# Patient Record
Sex: Female | Born: 1935 | Race: Black or African American | Hispanic: No | Marital: Single | Smoking: Former smoker
Health system: Southern US, Community
[De-identification: ages and names within clinical notes are randomized; demographics above are authoritative.]

## PROBLEM LIST (undated history)

## (undated) DIAGNOSIS — I82409 Acute embolism and thrombosis of unspecified deep veins of unspecified lower extremity: Secondary | ICD-10-CM

## (undated) DIAGNOSIS — R0600 Dyspnea, unspecified: Secondary | ICD-10-CM

## (undated) DIAGNOSIS — I6523 Occlusion and stenosis of bilateral carotid arteries: Secondary | ICD-10-CM

## (undated) DIAGNOSIS — E119 Type 2 diabetes mellitus without complications: Secondary | ICD-10-CM

## (undated) DIAGNOSIS — I10 Essential (primary) hypertension: Secondary | ICD-10-CM

## (undated) DIAGNOSIS — E785 Hyperlipidemia, unspecified: Secondary | ICD-10-CM

## (undated) DIAGNOSIS — M1990 Unspecified osteoarthritis, unspecified site: Secondary | ICD-10-CM

## (undated) DIAGNOSIS — I890 Lymphedema, not elsewhere classified: Secondary | ICD-10-CM

## (undated) DIAGNOSIS — R0609 Other forms of dyspnea: Secondary | ICD-10-CM

## (undated) HISTORY — PX: ABDOMINAL HYSTERECTOMY: SHX81

## (undated) HISTORY — DX: Occlusion and stenosis of bilateral carotid arteries: I65.23

## (undated) HISTORY — PX: CHOLECYSTECTOMY: SHX55

## (undated) HISTORY — DX: Dyspnea, unspecified: R06.00

## (undated) HISTORY — DX: Other forms of dyspnea: R06.09

## (undated) HISTORY — DX: Hyperlipidemia, unspecified: E78.5

---

## 2000-09-03 ENCOUNTER — Ambulatory Visit (HOSPITAL_COMMUNITY): Admission: RE | Admit: 2000-09-03 | Discharge: 2000-09-03 | Payer: Self-pay | Admitting: Family Medicine

## 2001-01-20 ENCOUNTER — Ambulatory Visit (HOSPITAL_COMMUNITY): Admission: RE | Admit: 2001-01-20 | Discharge: 2001-01-20 | Payer: Self-pay | Admitting: *Deleted

## 2001-01-20 ENCOUNTER — Encounter: Payer: Self-pay | Admitting: *Deleted

## 2004-06-19 ENCOUNTER — Ambulatory Visit: Payer: Self-pay | Admitting: Family Medicine

## 2004-12-10 ENCOUNTER — Ambulatory Visit: Payer: Self-pay | Admitting: Family Medicine

## 2004-12-11 ENCOUNTER — Ambulatory Visit: Admission: RE | Admit: 2004-12-11 | Discharge: 2004-12-11 | Payer: Self-pay | Admitting: Family Medicine

## 2004-12-19 ENCOUNTER — Ambulatory Visit (HOSPITAL_COMMUNITY): Admission: RE | Admit: 2004-12-19 | Discharge: 2004-12-19 | Payer: Self-pay | Admitting: Cardiovascular Disease

## 2004-12-24 ENCOUNTER — Ambulatory Visit: Payer: Self-pay | Admitting: Family Medicine

## 2005-02-18 ENCOUNTER — Ambulatory Visit: Payer: Self-pay | Admitting: Family Medicine

## 2005-02-28 ENCOUNTER — Ambulatory Visit: Payer: Self-pay | Admitting: Family Medicine

## 2005-03-07 ENCOUNTER — Ambulatory Visit: Payer: Self-pay | Admitting: Family Medicine

## 2005-03-19 ENCOUNTER — Ambulatory Visit: Payer: Self-pay | Admitting: Internal Medicine

## 2005-09-30 ENCOUNTER — Ambulatory Visit: Payer: Self-pay | Admitting: Family Medicine

## 2005-10-21 ENCOUNTER — Ambulatory Visit: Payer: Self-pay | Admitting: Family Medicine

## 2005-11-07 ENCOUNTER — Inpatient Hospital Stay (HOSPITAL_COMMUNITY): Admission: EM | Admit: 2005-11-07 | Discharge: 2005-11-09 | Payer: Self-pay | Admitting: Emergency Medicine

## 2005-11-07 ENCOUNTER — Ambulatory Visit (HOSPITAL_COMMUNITY): Admission: RE | Admit: 2005-11-07 | Discharge: 2005-11-07 | Payer: Self-pay | Admitting: Family Medicine

## 2005-11-11 ENCOUNTER — Ambulatory Visit: Payer: Self-pay | Admitting: Family Medicine

## 2005-11-15 ENCOUNTER — Ambulatory Visit: Payer: Self-pay | Admitting: Family Medicine

## 2005-11-18 ENCOUNTER — Ambulatory Visit: Payer: Self-pay | Admitting: Family Medicine

## 2005-11-29 ENCOUNTER — Ambulatory Visit: Payer: Self-pay | Admitting: Family Medicine

## 2005-12-09 ENCOUNTER — Ambulatory Visit: Payer: Self-pay | Admitting: Family Medicine

## 2005-12-26 ENCOUNTER — Ambulatory Visit: Payer: Self-pay | Admitting: Family Medicine

## 2006-02-06 ENCOUNTER — Ambulatory Visit: Payer: Self-pay | Admitting: Family Medicine

## 2006-03-10 ENCOUNTER — Ambulatory Visit: Payer: Self-pay | Admitting: Family Medicine

## 2006-03-18 ENCOUNTER — Ambulatory Visit: Payer: Self-pay | Admitting: Family Medicine

## 2006-05-30 ENCOUNTER — Ambulatory Visit: Payer: Self-pay | Admitting: Family Medicine

## 2006-06-04 ENCOUNTER — Ambulatory Visit: Payer: Self-pay | Admitting: Family Medicine

## 2006-06-06 ENCOUNTER — Ambulatory Visit: Payer: Self-pay | Admitting: Internal Medicine

## 2006-06-12 ENCOUNTER — Ambulatory Visit (HOSPITAL_COMMUNITY): Admission: RE | Admit: 2006-06-12 | Discharge: 2006-06-12 | Payer: Self-pay | Admitting: Family Medicine

## 2006-07-25 DIAGNOSIS — K922 Gastrointestinal hemorrhage, unspecified: Secondary | ICD-10-CM | POA: Insufficient documentation

## 2006-08-11 ENCOUNTER — Ambulatory Visit: Payer: Self-pay | Admitting: Family Medicine

## 2006-08-15 ENCOUNTER — Ambulatory Visit: Payer: Self-pay | Admitting: Family Medicine

## 2006-08-25 ENCOUNTER — Ambulatory Visit: Payer: Self-pay | Admitting: Family Medicine

## 2006-09-30 ENCOUNTER — Ambulatory Visit: Payer: Self-pay | Admitting: Family Medicine

## 2006-10-14 ENCOUNTER — Ambulatory Visit: Payer: Self-pay | Admitting: Family Medicine

## 2006-10-23 ENCOUNTER — Ambulatory Visit (HOSPITAL_COMMUNITY): Admission: RE | Admit: 2006-10-23 | Discharge: 2006-10-23 | Payer: Self-pay | Admitting: Gastroenterology

## 2006-10-24 ENCOUNTER — Encounter: Payer: Self-pay | Admitting: Physician Assistant

## 2006-10-24 ENCOUNTER — Encounter (INDEPENDENT_AMBULATORY_CARE_PROVIDER_SITE_OTHER): Payer: Self-pay | Admitting: *Deleted

## 2006-10-24 ENCOUNTER — Ambulatory Visit (HOSPITAL_COMMUNITY): Admission: RE | Admit: 2006-10-24 | Discharge: 2006-10-24 | Payer: Self-pay | Admitting: Gastroenterology

## 2006-11-11 ENCOUNTER — Ambulatory Visit: Payer: Self-pay | Admitting: Family Medicine

## 2006-12-25 ENCOUNTER — Ambulatory Visit: Payer: Self-pay | Admitting: Family Medicine

## 2007-04-07 ENCOUNTER — Ambulatory Visit: Payer: Self-pay | Admitting: Family Medicine

## 2007-04-07 ENCOUNTER — Encounter (INDEPENDENT_AMBULATORY_CARE_PROVIDER_SITE_OTHER): Payer: Self-pay | Admitting: Internal Medicine

## 2007-04-07 LAB — CONVERTED CEMR LAB
INR: 1.5 (ref 0.0–1.5)
Prothrombin Time: 18.7 s — ABNORMAL HIGH (ref 11.6–15.2)

## 2007-04-10 DIAGNOSIS — Z794 Long term (current) use of insulin: Secondary | ICD-10-CM

## 2007-04-10 DIAGNOSIS — Z8719 Personal history of other diseases of the digestive system: Secondary | ICD-10-CM | POA: Insufficient documentation

## 2007-04-10 DIAGNOSIS — Z86718 Personal history of other venous thrombosis and embolism: Secondary | ICD-10-CM

## 2007-04-10 DIAGNOSIS — Z9889 Other specified postprocedural states: Secondary | ICD-10-CM | POA: Insufficient documentation

## 2007-04-10 DIAGNOSIS — E119 Type 2 diabetes mellitus without complications: Secondary | ICD-10-CM | POA: Insufficient documentation

## 2007-04-10 DIAGNOSIS — I10 Essential (primary) hypertension: Secondary | ICD-10-CM | POA: Insufficient documentation

## 2007-04-10 DIAGNOSIS — E11319 Type 2 diabetes mellitus with unspecified diabetic retinopathy without macular edema: Secondary | ICD-10-CM

## 2007-04-10 DIAGNOSIS — E11649 Type 2 diabetes mellitus with hypoglycemia without coma: Secondary | ICD-10-CM | POA: Insufficient documentation

## 2007-04-10 DIAGNOSIS — E785 Hyperlipidemia, unspecified: Secondary | ICD-10-CM

## 2007-04-10 DIAGNOSIS — R609 Edema, unspecified: Secondary | ICD-10-CM | POA: Insufficient documentation

## 2007-04-10 DIAGNOSIS — E113299 Type 2 diabetes mellitus with mild nonproliferative diabetic retinopathy without macular edema, unspecified eye: Secondary | ICD-10-CM | POA: Insufficient documentation

## 2007-05-11 ENCOUNTER — Telehealth (INDEPENDENT_AMBULATORY_CARE_PROVIDER_SITE_OTHER): Payer: Self-pay | Admitting: Family Medicine

## 2007-05-12 ENCOUNTER — Ambulatory Visit: Payer: Self-pay | Admitting: Family Medicine

## 2007-05-15 LAB — CONVERTED CEMR LAB: INR: 2.4 — ABNORMAL HIGH (ref 0.0–1.5)

## 2007-06-09 ENCOUNTER — Ambulatory Visit: Payer: Self-pay | Admitting: Family Medicine

## 2007-06-10 LAB — CONVERTED CEMR LAB: Prothrombin Time: 26.6 s — ABNORMAL HIGH (ref 11.6–15.2)

## 2007-07-07 ENCOUNTER — Ambulatory Visit: Payer: Self-pay | Admitting: Family Medicine

## 2007-07-07 ENCOUNTER — Telehealth (INDEPENDENT_AMBULATORY_CARE_PROVIDER_SITE_OTHER): Payer: Self-pay | Admitting: Family Medicine

## 2007-07-14 ENCOUNTER — Telehealth (INDEPENDENT_AMBULATORY_CARE_PROVIDER_SITE_OTHER): Payer: Self-pay | Admitting: Family Medicine

## 2007-07-15 LAB — CONVERTED CEMR LAB: INR: 2.6 — ABNORMAL HIGH (ref 0.0–1.5)

## 2007-08-10 ENCOUNTER — Ambulatory Visit: Payer: Self-pay | Admitting: Family Medicine

## 2007-08-11 LAB — CONVERTED CEMR LAB
INR: 2.8 — ABNORMAL HIGH (ref 0.0–1.5)
Prothrombin Time: 30.4 s — ABNORMAL HIGH (ref 11.6–15.2)

## 2007-09-09 ENCOUNTER — Ambulatory Visit: Payer: Self-pay | Admitting: Family Medicine

## 2007-09-09 DIAGNOSIS — I872 Venous insufficiency (chronic) (peripheral): Secondary | ICD-10-CM | POA: Insufficient documentation

## 2007-09-09 LAB — CONVERTED CEMR LAB: Hgb A1c MFr Bld: 7.6 %

## 2007-09-14 LAB — CONVERTED CEMR LAB
ALT: 15 U/L
AST: 14 U/L
Albumin: 4.2 g/dL
Alkaline Phosphatase: 78 U/L
BUN: 20 mg/dL
Basophils Absolute: 0 K/uL
Basophils Relative: 0 %
CO2: 23 meq/L
Calcium: 9.3 mg/dL
Chloride: 104 meq/L
Cholesterol: 152 mg/dL
Creatinine, Ser: 0.99 mg/dL
Eosinophils Absolute: 0.1 K/uL
Eosinophils Relative: 1 %
Glucose, Bld: 69 mg/dL — ABNORMAL LOW
HCT: 39 %
HDL: 62 mg/dL
Hemoglobin: 12.2 g/dL
INR: 2.5 — ABNORMAL HIGH
LDL Cholesterol: 68 mg/dL
Lymphocytes Relative: 18 %
Lymphs Abs: 1.3 K/uL
MCHC: 31.3 g/dL
MCV: 90.9 fL
Monocytes Absolute: 0.4 K/uL
Monocytes Relative: 5 %
Neutro Abs: 5.3 K/uL
Neutrophils Relative %: 75 %
Platelets: 285 K/uL
Potassium: 4.8 meq/L
Prothrombin Time: 28.1 s — ABNORMAL HIGH
RBC: 4.29 M/uL
RDW: 14.4 %
Sodium: 142 meq/L
TSH: 0.603 u[IU]/mL
Total Bilirubin: 0.4 mg/dL
Total CHOL/HDL Ratio: 2.5
Total Protein: 7.3 g/dL
Triglycerides: 112 mg/dL
VLDL: 22 mg/dL
WBC: 7 10*3/microliter

## 2007-10-13 ENCOUNTER — Ambulatory Visit: Payer: Self-pay | Admitting: Family Medicine

## 2007-10-15 LAB — CONVERTED CEMR LAB
INR: 2.4 — ABNORMAL HIGH (ref 0.0–1.5)
Prothrombin Time: 27.1 s — ABNORMAL HIGH (ref 11.6–15.2)

## 2007-11-10 ENCOUNTER — Encounter (INDEPENDENT_AMBULATORY_CARE_PROVIDER_SITE_OTHER): Payer: Self-pay | Admitting: Family Medicine

## 2007-11-10 ENCOUNTER — Ambulatory Visit: Payer: Self-pay | Admitting: Internal Medicine

## 2007-11-12 LAB — CONVERTED CEMR LAB
INR: 2 — ABNORMAL HIGH (ref 0.0–1.5)
Prothrombin Time: 23.5 s — ABNORMAL HIGH (ref 11.6–15.2)

## 2007-12-14 ENCOUNTER — Ambulatory Visit: Payer: Self-pay | Admitting: Family Medicine

## 2007-12-17 LAB — CONVERTED CEMR LAB: INR: 2.4 — ABNORMAL HIGH (ref 0.0–1.5)

## 2008-01-14 ENCOUNTER — Encounter (INDEPENDENT_AMBULATORY_CARE_PROVIDER_SITE_OTHER): Payer: Self-pay | Admitting: Nurse Practitioner

## 2008-01-14 ENCOUNTER — Ambulatory Visit: Payer: Self-pay | Admitting: Family Medicine

## 2008-01-15 LAB — CONVERTED CEMR LAB: Prothrombin Time: 29.7 s — ABNORMAL HIGH (ref 11.6–15.2)

## 2008-02-15 ENCOUNTER — Ambulatory Visit: Payer: Self-pay | Admitting: Family Medicine

## 2008-03-16 ENCOUNTER — Ambulatory Visit: Payer: Self-pay | Admitting: Family Medicine

## 2008-03-16 ENCOUNTER — Telehealth (INDEPENDENT_AMBULATORY_CARE_PROVIDER_SITE_OTHER): Payer: Self-pay | Admitting: Family Medicine

## 2008-03-25 ENCOUNTER — Encounter (INDEPENDENT_AMBULATORY_CARE_PROVIDER_SITE_OTHER): Payer: Self-pay | Admitting: Family Medicine

## 2008-03-31 ENCOUNTER — Ambulatory Visit: Payer: Self-pay | Admitting: Family Medicine

## 2008-03-31 ENCOUNTER — Encounter (INDEPENDENT_AMBULATORY_CARE_PROVIDER_SITE_OTHER): Payer: Self-pay | Admitting: Nurse Practitioner

## 2008-04-01 LAB — CONVERTED CEMR LAB: INR: 3.8 — ABNORMAL HIGH (ref 0.0–1.5)

## 2008-04-15 ENCOUNTER — Ambulatory Visit: Payer: Self-pay | Admitting: Family Medicine

## 2008-04-20 LAB — CONVERTED CEMR LAB
INR: 2.3 — ABNORMAL HIGH (ref 0.0–1.5)
Prothrombin Time: 26.3 s — ABNORMAL HIGH (ref 11.6–15.2)

## 2008-05-03 ENCOUNTER — Telehealth (INDEPENDENT_AMBULATORY_CARE_PROVIDER_SITE_OTHER): Payer: Self-pay | Admitting: *Deleted

## 2008-05-05 ENCOUNTER — Telehealth (INDEPENDENT_AMBULATORY_CARE_PROVIDER_SITE_OTHER): Payer: Self-pay | Admitting: Family Medicine

## 2008-05-25 ENCOUNTER — Ambulatory Visit: Payer: Self-pay | Admitting: Family Medicine

## 2008-05-25 DIAGNOSIS — M79609 Pain in unspecified limb: Secondary | ICD-10-CM | POA: Insufficient documentation

## 2008-05-26 ENCOUNTER — Encounter (INDEPENDENT_AMBULATORY_CARE_PROVIDER_SITE_OTHER): Payer: Self-pay | Admitting: Ophthalmology

## 2008-05-26 ENCOUNTER — Ambulatory Visit: Payer: Self-pay | Admitting: Vascular Surgery

## 2008-05-26 ENCOUNTER — Telehealth (INDEPENDENT_AMBULATORY_CARE_PROVIDER_SITE_OTHER): Payer: Self-pay | Admitting: Family Medicine

## 2008-05-26 ENCOUNTER — Ambulatory Visit (HOSPITAL_COMMUNITY): Admission: RE | Admit: 2008-05-26 | Discharge: 2008-05-26 | Payer: Self-pay | Admitting: Family Medicine

## 2008-05-26 ENCOUNTER — Encounter (INDEPENDENT_AMBULATORY_CARE_PROVIDER_SITE_OTHER): Payer: Self-pay | Admitting: Family Medicine

## 2008-05-26 LAB — CONVERTED CEMR LAB
INR: 2.6 — ABNORMAL HIGH (ref 0.0–1.5)
Prothrombin Time: 29.8 s — ABNORMAL HIGH (ref 11.6–15.2)

## 2008-06-07 ENCOUNTER — Ambulatory Visit: Payer: Self-pay | Admitting: Family Medicine

## 2008-06-07 LAB — CONVERTED CEMR LAB: Blood Glucose, Fingerstick: 191

## 2008-06-14 ENCOUNTER — Ambulatory Visit (HOSPITAL_COMMUNITY): Admission: RE | Admit: 2008-06-14 | Discharge: 2008-06-14 | Payer: Self-pay | Admitting: Family Medicine

## 2008-06-15 ENCOUNTER — Telehealth (INDEPENDENT_AMBULATORY_CARE_PROVIDER_SITE_OTHER): Payer: Self-pay | Admitting: Family Medicine

## 2008-06-29 ENCOUNTER — Ambulatory Visit: Payer: Self-pay | Admitting: Family Medicine

## 2008-07-01 LAB — CONVERTED CEMR LAB
ALT: 17 units/L (ref 0–35)
BUN: 13 mg/dL (ref 6–23)
CO2: 25 meq/L (ref 19–32)
Cholesterol: 136 mg/dL (ref 0–200)
Creatinine, Ser: 1 mg/dL (ref 0.40–1.20)
Glucose, Bld: 164 mg/dL — ABNORMAL HIGH (ref 70–99)
HCT: 39.4 % (ref 36.0–46.0)
HDL: 54 mg/dL (ref >39)
LDL Cholesterol: 56 mg/dL (ref 0–99)
Lymphocytes Relative: 30 % (ref 12–46)
Lymphs Abs: 2.1 10*3/uL (ref 0.7–4.0)
Monocytes Absolute: 0.7 10*3/uL (ref 0.1–1.0)
Monocytes Relative: 10 % (ref 3–12)
Neutro Abs: 4.1 10*3/uL (ref 1.7–7.7)
Platelets: 312 10*3/uL (ref 150–400)
Potassium: 4.6 meq/L (ref 3.5–5.3)
Prothrombin Time: 45.3 s — ABNORMAL HIGH (ref 11.6–15.2)
TSH: 0.799 microintl units/mL (ref 0.350–4.50)
Triglycerides: 132 mg/dL (ref <150)

## 2008-07-05 ENCOUNTER — Ambulatory Visit: Payer: Self-pay | Admitting: Family Medicine

## 2008-07-11 ENCOUNTER — Ambulatory Visit: Payer: Self-pay | Admitting: Family Medicine

## 2008-07-13 LAB — CONVERTED CEMR LAB: INR: 3.1 — ABNORMAL HIGH (ref 0.0–1.5)

## 2008-07-19 ENCOUNTER — Telehealth (INDEPENDENT_AMBULATORY_CARE_PROVIDER_SITE_OTHER): Payer: Self-pay | Admitting: *Deleted

## 2008-08-03 ENCOUNTER — Encounter (INDEPENDENT_AMBULATORY_CARE_PROVIDER_SITE_OTHER): Payer: Self-pay | Admitting: *Deleted

## 2008-08-16 ENCOUNTER — Ambulatory Visit: Payer: Self-pay | Admitting: Family Medicine

## 2008-08-16 DIAGNOSIS — B9789 Other viral agents as the cause of diseases classified elsewhere: Secondary | ICD-10-CM

## 2008-09-16 ENCOUNTER — Encounter (INDEPENDENT_AMBULATORY_CARE_PROVIDER_SITE_OTHER): Payer: Self-pay | Admitting: Family Medicine

## 2008-09-18 ENCOUNTER — Emergency Department (HOSPITAL_COMMUNITY): Admission: EM | Admit: 2008-09-18 | Discharge: 2008-09-18 | Payer: Self-pay | Admitting: Emergency Medicine

## 2008-09-20 ENCOUNTER — Ambulatory Visit: Payer: Self-pay | Admitting: Family Medicine

## 2008-11-02 ENCOUNTER — Ambulatory Visit: Payer: Self-pay | Admitting: Family Medicine

## 2008-11-02 ENCOUNTER — Telehealth (INDEPENDENT_AMBULATORY_CARE_PROVIDER_SITE_OTHER): Payer: Self-pay | Admitting: Family Medicine

## 2008-11-02 LAB — CONVERTED CEMR LAB
INR: 1
Prothrombin Time: 13.3 s

## 2008-11-03 ENCOUNTER — Telehealth (INDEPENDENT_AMBULATORY_CARE_PROVIDER_SITE_OTHER): Payer: Self-pay | Admitting: Internal Medicine

## 2008-12-02 ENCOUNTER — Ambulatory Visit: Payer: Self-pay | Admitting: Family Medicine

## 2008-12-02 ENCOUNTER — Telehealth (INDEPENDENT_AMBULATORY_CARE_PROVIDER_SITE_OTHER): Payer: Self-pay | Admitting: Family Medicine

## 2008-12-07 LAB — CONVERTED CEMR LAB: INR: 3.4 — ABNORMAL HIGH (ref 0.0–1.5)

## 2009-01-09 ENCOUNTER — Ambulatory Visit: Payer: Self-pay | Admitting: Family Medicine

## 2009-01-12 LAB — CONVERTED CEMR LAB: INR: 1.7 — ABNORMAL HIGH (ref 0.0–1.5)

## 2009-01-18 ENCOUNTER — Ambulatory Visit: Payer: Self-pay | Admitting: Family Medicine

## 2009-02-09 ENCOUNTER — Ambulatory Visit: Payer: Self-pay | Admitting: Family Medicine

## 2009-02-13 ENCOUNTER — Telehealth (INDEPENDENT_AMBULATORY_CARE_PROVIDER_SITE_OTHER): Payer: Self-pay | Admitting: Family Medicine

## 2009-02-13 LAB — CONVERTED CEMR LAB: Prothrombin Time: 26.1 s — ABNORMAL HIGH (ref 11.6–15.2)

## 2009-02-28 ENCOUNTER — Ambulatory Visit: Payer: Self-pay | Admitting: Physician Assistant

## 2009-02-28 DIAGNOSIS — R011 Cardiac murmur, unspecified: Secondary | ICD-10-CM

## 2009-03-07 LAB — CONVERTED CEMR LAB
ALT: 18 units/L (ref 0–35)
AST: 13 units/L (ref 0–37)
Albumin: 4.2 g/dL (ref 3.5–5.2)
Basophils Relative: 0 % (ref 0–1)
Chloride: 102 meq/L (ref 96–112)
Lymphocytes Relative: 25 % (ref 12–46)
MCHC: 31.8 g/dL (ref 30.0–36.0)
Monocytes Relative: 8 % (ref 3–12)
Neutro Abs: 4.5 10*3/uL (ref 1.7–7.7)
Neutrophils Relative %: 64 % (ref 43–77)
Potassium: 4.4 meq/L (ref 3.5–5.3)
RDW: 14.8 % (ref 11.5–15.5)
WBC: 7.1 10*3/uL (ref 4.0–10.5)

## 2009-03-09 ENCOUNTER — Ambulatory Visit (HOSPITAL_COMMUNITY): Admission: RE | Admit: 2009-03-09 | Discharge: 2009-03-09 | Payer: Self-pay | Admitting: Internal Medicine

## 2009-03-09 ENCOUNTER — Encounter (INDEPENDENT_AMBULATORY_CARE_PROVIDER_SITE_OTHER): Payer: Self-pay | Admitting: Internal Medicine

## 2009-03-10 ENCOUNTER — Ambulatory Visit: Payer: Self-pay | Admitting: Physician Assistant

## 2009-03-13 ENCOUNTER — Encounter: Payer: Self-pay | Admitting: Physician Assistant

## 2009-03-17 ENCOUNTER — Ambulatory Visit: Payer: Self-pay | Admitting: Physician Assistant

## 2009-03-17 ENCOUNTER — Telehealth: Payer: Self-pay | Admitting: Physician Assistant

## 2009-03-17 LAB — CONVERTED CEMR LAB: Blood Glucose, Fingerstick: 150

## 2009-03-21 ENCOUNTER — Ambulatory Visit (HOSPITAL_COMMUNITY): Admission: RE | Admit: 2009-03-21 | Discharge: 2009-03-21 | Payer: Self-pay | Admitting: Internal Medicine

## 2009-03-24 ENCOUNTER — Ambulatory Visit: Payer: Self-pay | Admitting: Physician Assistant

## 2009-04-07 ENCOUNTER — Telehealth: Payer: Self-pay | Admitting: Physician Assistant

## 2009-04-07 ENCOUNTER — Ambulatory Visit: Payer: Self-pay | Admitting: Physician Assistant

## 2009-04-10 ENCOUNTER — Encounter: Payer: Self-pay | Admitting: Physician Assistant

## 2009-05-02 ENCOUNTER — Ambulatory Visit: Payer: Self-pay | Admitting: Physician Assistant

## 2009-05-02 LAB — CONVERTED CEMR LAB: INR: 3.4 — ABNORMAL HIGH (ref 0.0–1.5)

## 2009-05-03 ENCOUNTER — Telehealth: Payer: Self-pay | Admitting: Physician Assistant

## 2009-05-19 ENCOUNTER — Ambulatory Visit: Payer: Self-pay | Admitting: Physician Assistant

## 2009-05-22 ENCOUNTER — Encounter: Payer: Self-pay | Admitting: Physician Assistant

## 2009-05-24 ENCOUNTER — Telehealth: Payer: Self-pay | Admitting: Physician Assistant

## 2009-05-26 ENCOUNTER — Encounter: Payer: Self-pay | Admitting: Physician Assistant

## 2009-05-30 ENCOUNTER — Ambulatory Visit: Payer: Self-pay | Admitting: Physician Assistant

## 2009-05-31 ENCOUNTER — Encounter: Payer: Self-pay | Admitting: Physician Assistant

## 2009-06-02 LAB — CONVERTED CEMR LAB
INR: 1.5 (ref 0.0–1.5)
Prothrombin Time: 18 s — ABNORMAL HIGH (ref 11.6–15.2)

## 2009-06-19 ENCOUNTER — Ambulatory Visit: Payer: Self-pay | Admitting: Physician Assistant

## 2009-06-23 LAB — CONVERTED CEMR LAB: Prothrombin Time: 25.9 s — ABNORMAL HIGH (ref 11.6–15.2)

## 2009-07-18 ENCOUNTER — Telehealth: Payer: Self-pay | Admitting: Physician Assistant

## 2009-07-27 ENCOUNTER — Ambulatory Visit: Payer: Self-pay | Admitting: Physician Assistant

## 2009-08-03 LAB — CONVERTED CEMR LAB: INR: 2.32 — ABNORMAL HIGH (ref <1.50)

## 2009-08-24 ENCOUNTER — Telehealth: Payer: Self-pay | Admitting: Physician Assistant

## 2009-09-01 ENCOUNTER — Ambulatory Visit: Payer: Self-pay | Admitting: Physician Assistant

## 2009-09-01 DIAGNOSIS — M25569 Pain in unspecified knee: Secondary | ICD-10-CM

## 2009-09-01 DIAGNOSIS — K429 Umbilical hernia without obstruction or gangrene: Secondary | ICD-10-CM | POA: Insufficient documentation

## 2009-09-01 DIAGNOSIS — E559 Vitamin D deficiency, unspecified: Secondary | ICD-10-CM | POA: Insufficient documentation

## 2009-09-01 DIAGNOSIS — R82998 Other abnormal findings in urine: Secondary | ICD-10-CM | POA: Insufficient documentation

## 2009-09-01 LAB — CONVERTED CEMR LAB
Nitrite: NEGATIVE
Specific Gravity, Urine: 1.01
Urobilinogen, UA: 0.2
pH: 5

## 2009-09-02 ENCOUNTER — Encounter: Payer: Self-pay | Admitting: Physician Assistant

## 2009-09-04 LAB — CONVERTED CEMR LAB
ALT: 12 units/L (ref 0–35)
Albumin: 4.1 g/dL (ref 3.5–5.2)
Bacteria, UA: NONE SEEN
Basophils Absolute: 0 10*3/uL (ref 0.0–0.1)
Basophils Relative: 0 % (ref 0–1)
CO2: 28 meq/L (ref 19–32)
Calcium: 9.4 mg/dL (ref 8.4–10.5)
Chloride: 102 meq/L (ref 96–112)
Creatinine, Ser: 0.98 mg/dL (ref 0.40–1.20)
Crystals: NONE SEEN
Eosinophils Relative: 2 % (ref 0–5)
HCT: 36.9 % (ref 36.0–46.0)
Hemoglobin: 11.7 g/dL — ABNORMAL LOW (ref 12.0–15.0)
INR: 2.35 — ABNORMAL HIGH (ref <1.50)
Lymphocytes Relative: 23 % (ref 12–46)
MCHC: 31.7 g/dL (ref 30.0–36.0)
Monocytes Absolute: 0.5 10*3/uL (ref 0.1–1.0)
Potassium: 4.7 meq/L (ref 3.5–5.3)
RDW: 14.9 % (ref 11.5–15.5)
TSH: 0.754 microintl units/mL (ref 0.350–4.500)
Vit D, 25-Hydroxy: 36 ng/mL (ref 30–89)

## 2009-09-07 ENCOUNTER — Ambulatory Visit (HOSPITAL_COMMUNITY): Admission: RE | Admit: 2009-09-07 | Discharge: 2009-09-07 | Payer: Self-pay | Admitting: Internal Medicine

## 2009-09-18 ENCOUNTER — Encounter: Payer: Self-pay | Admitting: Physician Assistant

## 2009-09-18 ENCOUNTER — Telehealth: Payer: Self-pay | Admitting: Physician Assistant

## 2009-09-21 ENCOUNTER — Encounter: Payer: Self-pay | Admitting: Physician Assistant

## 2009-09-21 ENCOUNTER — Encounter: Admission: RE | Admit: 2009-09-21 | Discharge: 2009-12-20 | Payer: Self-pay | Admitting: Physician Assistant

## 2009-10-02 ENCOUNTER — Ambulatory Visit: Payer: Self-pay | Admitting: Physician Assistant

## 2009-10-02 ENCOUNTER — Telehealth: Payer: Self-pay | Admitting: Physician Assistant

## 2009-10-03 LAB — CONVERTED CEMR LAB
INR: 2.51 — ABNORMAL HIGH (ref <1.50)
Prothrombin Time: 26.9 s — ABNORMAL HIGH (ref 11.6–15.2)

## 2009-10-04 ENCOUNTER — Encounter: Payer: Self-pay | Admitting: Physician Assistant

## 2009-10-13 ENCOUNTER — Encounter: Payer: Self-pay | Admitting: Cardiology

## 2009-10-13 ENCOUNTER — Encounter: Payer: Self-pay | Admitting: Physician Assistant

## 2009-10-17 ENCOUNTER — Encounter: Payer: Self-pay | Admitting: Physician Assistant

## 2009-10-20 ENCOUNTER — Encounter: Payer: Self-pay | Admitting: Cardiology

## 2009-10-22 ENCOUNTER — Encounter: Payer: Self-pay | Admitting: Physician Assistant

## 2009-10-24 ENCOUNTER — Ambulatory Visit: Payer: Self-pay | Admitting: Cardiology

## 2009-10-25 ENCOUNTER — Encounter (INDEPENDENT_AMBULATORY_CARE_PROVIDER_SITE_OTHER): Payer: Self-pay | Admitting: Surgery

## 2009-10-25 ENCOUNTER — Inpatient Hospital Stay (HOSPITAL_COMMUNITY): Admission: RE | Admit: 2009-10-25 | Discharge: 2009-10-30 | Payer: Self-pay | Admitting: Surgery

## 2009-10-30 ENCOUNTER — Encounter: Payer: Self-pay | Admitting: Physician Assistant

## 2009-11-08 ENCOUNTER — Ambulatory Visit: Payer: Self-pay | Admitting: Physician Assistant

## 2009-11-08 DIAGNOSIS — K436 Other and unspecified ventral hernia with obstruction, without gangrene: Secondary | ICD-10-CM | POA: Insufficient documentation

## 2009-11-09 LAB — CONVERTED CEMR LAB: Prothrombin Time: 18.1 s — ABNORMAL HIGH (ref 11.6–15.2)

## 2009-11-15 ENCOUNTER — Ambulatory Visit: Payer: Self-pay | Admitting: Physician Assistant

## 2009-12-07 ENCOUNTER — Ambulatory Visit: Payer: Self-pay | Admitting: Physician Assistant

## 2009-12-07 DIAGNOSIS — H6120 Impacted cerumen, unspecified ear: Secondary | ICD-10-CM

## 2009-12-08 DIAGNOSIS — R809 Proteinuria, unspecified: Secondary | ICD-10-CM | POA: Insufficient documentation

## 2009-12-08 LAB — CONVERTED CEMR LAB
Creatinine, Urine: 113.9 mg/dL
INR: 2.21 — ABNORMAL HIGH (ref <1.50)
Microalb Creat Ratio: 23 mg/g (ref 0.0–30.0)
Prothrombin Time: 24.3 s — ABNORMAL HIGH (ref 11.6–15.2)

## 2009-12-20 ENCOUNTER — Ambulatory Visit: Payer: Self-pay | Admitting: Physician Assistant

## 2009-12-21 ENCOUNTER — Ambulatory Visit: Payer: Self-pay | Admitting: Physician Assistant

## 2009-12-22 LAB — CONVERTED CEMR LAB
ALT: 13 units/L (ref 0–35)
AST: 15 units/L (ref 0–37)
Albumin: 4.1 g/dL (ref 3.5–5.2)
Alkaline Phosphatase: 58 units/L (ref 39–117)
Basophils Absolute: 0 10*3/uL (ref 0.0–0.1)
Calcium: 9.1 mg/dL (ref 8.4–10.5)
Chloride: 102 meq/L (ref 96–112)
Creatinine, Ser: 1.1 mg/dL (ref 0.40–1.20)
HDL: 58 mg/dL (ref >39)
LDL Cholesterol: 57 mg/dL (ref 0–99)
Lymphocytes Relative: 18 % (ref 12–46)
Lymphs Abs: 1.1 10*3/uL (ref 0.7–4.0)
Neutrophils Relative %: 69 % (ref 43–77)
Platelets: 291 10*3/uL (ref 150–400)
Potassium: 4.5 meq/L (ref 3.5–5.3)
RDW: 15.7 % — ABNORMAL HIGH (ref 11.5–15.5)
Total CHOL/HDL Ratio: 2.4
WBC: 6.1 10*3/uL (ref 4.0–10.5)

## 2009-12-25 ENCOUNTER — Encounter: Payer: Self-pay | Admitting: Physician Assistant

## 2009-12-28 DIAGNOSIS — D649 Anemia, unspecified: Secondary | ICD-10-CM

## 2009-12-28 LAB — CONVERTED CEMR LAB
Iron: 40 ug/dL — ABNORMAL LOW (ref 42–145)
Retic Ct Pct: 1 % (ref 0.4–3.1)
UIBC: 338 ug/dL

## 2010-01-03 ENCOUNTER — Telehealth: Payer: Self-pay | Admitting: Physician Assistant

## 2010-01-08 ENCOUNTER — Ambulatory Visit: Payer: Self-pay | Admitting: Nurse Practitioner

## 2010-01-08 ENCOUNTER — Encounter: Payer: Self-pay | Admitting: Physician Assistant

## 2010-02-02 ENCOUNTER — Encounter: Payer: Self-pay | Admitting: Physician Assistant

## 2010-02-02 DIAGNOSIS — E1139 Type 2 diabetes mellitus with other diabetic ophthalmic complication: Secondary | ICD-10-CM

## 2010-02-08 ENCOUNTER — Ambulatory Visit: Payer: Self-pay | Admitting: Physician Assistant

## 2010-02-08 ENCOUNTER — Telehealth: Payer: Self-pay | Admitting: Physician Assistant

## 2010-02-09 ENCOUNTER — Encounter: Payer: Self-pay | Admitting: Physician Assistant

## 2010-02-09 LAB — CONVERTED CEMR LAB
INR: 3.36 — ABNORMAL HIGH (ref <1.50)
Prothrombin Time: 33.8 s — ABNORMAL HIGH (ref 11.6–15.2)

## 2010-02-12 ENCOUNTER — Ambulatory Visit: Payer: Self-pay | Admitting: Physician Assistant

## 2010-02-12 LAB — CONVERTED CEMR LAB
OCCULT 1: NEGATIVE
OCCULT 2: NEGATIVE

## 2010-02-14 LAB — CONVERTED CEMR LAB
Basophils Relative: 1 % (ref 0–1)
Lymphocytes Relative: 16 % (ref 12–46)
Lymphs Abs: 1.1 10*3/uL (ref 0.7–4.0)
MCHC: 29.8 g/dL — ABNORMAL LOW (ref 30.0–36.0)
Monocytes Relative: 7 % (ref 3–12)
Neutro Abs: 5.3 10*3/uL (ref 1.7–7.7)
Neutrophils Relative %: 74 % (ref 43–77)
RBC: 4.07 M/uL (ref 3.87–5.11)
WBC: 7.1 10*3/uL (ref 4.0–10.5)

## 2010-03-13 ENCOUNTER — Ambulatory Visit: Payer: Self-pay | Admitting: Physician Assistant

## 2010-03-13 LAB — CONVERTED CEMR LAB
Blood Glucose, Fingerstick: 143
Hgb A1c MFr Bld: 7.6 %

## 2010-03-14 LAB — CONVERTED CEMR LAB
BUN: 21 mg/dL (ref 6–23)
Chloride: 105 meq/L (ref 96–112)
Creatinine, Ser: 0.95 mg/dL (ref 0.40–1.20)
Eosinophils Absolute: 0.2 10*3/uL (ref 0.0–0.7)
Eosinophils Relative: 4 % (ref 0–5)
Glucose, Bld: 149 mg/dL — ABNORMAL HIGH (ref 70–99)
Lymphocytes Relative: 21 % (ref 12–46)
Lymphs Abs: 1.3 10*3/uL (ref 0.7–4.0)
Monocytes Absolute: 0.5 10*3/uL (ref 0.1–1.0)
Platelets: 279 10*3/uL (ref 150–400)
Potassium: 4.5 meq/L (ref 3.5–5.3)
Prothrombin Time: 18.9 s — ABNORMAL HIGH (ref 11.6–15.2)
RBC: 3.97 M/uL (ref 3.87–5.11)
RDW: 17.3 % — ABNORMAL HIGH (ref 11.5–15.5)

## 2010-03-30 ENCOUNTER — Ambulatory Visit: Payer: Self-pay | Admitting: Physician Assistant

## 2010-03-30 LAB — CONVERTED CEMR LAB: INR: 2.53 — ABNORMAL HIGH (ref <1.50)

## 2010-04-03 ENCOUNTER — Telehealth: Payer: Self-pay | Admitting: Physician Assistant

## 2010-04-03 ENCOUNTER — Encounter: Payer: Self-pay | Admitting: Physician Assistant

## 2010-04-04 ENCOUNTER — Ambulatory Visit: Payer: Self-pay | Admitting: Physician Assistant

## 2010-04-04 ENCOUNTER — Telehealth (INDEPENDENT_AMBULATORY_CARE_PROVIDER_SITE_OTHER): Payer: Self-pay | Admitting: *Deleted

## 2010-04-04 DIAGNOSIS — R0609 Other forms of dyspnea: Secondary | ICD-10-CM

## 2010-04-04 LAB — CONVERTED CEMR LAB
Albumin: 4.3 g/dL (ref 3.5–5.2)
Alkaline Phosphatase: 73 units/L (ref 39–117)
BUN: 17 mg/dL (ref 6–23)
CO2: 31 meq/L (ref 19–32)
Calcium: 9.2 mg/dL (ref 8.4–10.5)
Chloride: 100 meq/L (ref 96–112)
Glucose, Bld: 147 mg/dL — ABNORMAL HIGH (ref 70–99)
Potassium: 4.5 meq/L (ref 3.5–5.3)
Pro B Natriuretic peptide (BNP): 8.9 pg/mL (ref 0.0–100.0)
Sodium: 141 meq/L (ref 135–145)
Total Protein: 7.3 g/dL (ref 6.0–8.3)

## 2010-04-05 ENCOUNTER — Encounter: Payer: Self-pay | Admitting: Physician Assistant

## 2010-04-06 ENCOUNTER — Encounter (INDEPENDENT_AMBULATORY_CARE_PROVIDER_SITE_OTHER): Payer: Self-pay | Admitting: Internal Medicine

## 2010-04-06 ENCOUNTER — Ambulatory Visit: Payer: Self-pay | Admitting: Vascular Surgery

## 2010-04-06 ENCOUNTER — Ambulatory Visit (HOSPITAL_COMMUNITY): Admission: RE | Admit: 2010-04-06 | Discharge: 2010-04-06 | Payer: Self-pay | Admitting: Internal Medicine

## 2010-04-27 ENCOUNTER — Ambulatory Visit: Payer: Self-pay | Admitting: Physician Assistant

## 2010-05-01 ENCOUNTER — Encounter (INDEPENDENT_AMBULATORY_CARE_PROVIDER_SITE_OTHER): Payer: Self-pay | Admitting: *Deleted

## 2010-05-01 LAB — CONVERTED CEMR LAB
Eosinophils Absolute: 0.2 10*3/uL (ref 0.0–0.7)
Lymphs Abs: 1.3 10*3/uL (ref 0.7–4.0)
MCV: 91 fL (ref 78.0–100.0)
Monocytes Relative: 9 % (ref 3–12)
Neutro Abs: 4.4 10*3/uL (ref 1.7–7.7)
Neutrophils Relative %: 67 % (ref 43–77)
Platelets: 269 10*3/uL (ref 150–400)
RBC: 4.31 M/uL (ref 3.87–5.11)
WBC: 6.5 10*3/uL (ref 4.0–10.5)

## 2010-05-09 ENCOUNTER — Encounter: Payer: Self-pay | Admitting: Physician Assistant

## 2010-05-11 ENCOUNTER — Telehealth: Payer: Self-pay | Admitting: Physician Assistant

## 2010-05-16 ENCOUNTER — Encounter: Payer: Self-pay | Admitting: Physician Assistant

## 2010-05-26 ENCOUNTER — Encounter: Payer: Self-pay | Admitting: Physician Assistant

## 2010-05-30 ENCOUNTER — Encounter: Payer: Self-pay | Admitting: Physician Assistant

## 2010-06-01 ENCOUNTER — Encounter: Payer: Self-pay | Admitting: Physician Assistant

## 2010-06-07 ENCOUNTER — Encounter: Payer: Self-pay | Admitting: Physician Assistant

## 2010-06-12 ENCOUNTER — Encounter: Payer: Self-pay | Admitting: Physician Assistant

## 2010-06-12 ENCOUNTER — Ambulatory Visit: Payer: Self-pay | Admitting: Internal Medicine

## 2010-06-13 LAB — CONVERTED CEMR LAB
INR: 3.37 — ABNORMAL HIGH (ref <1.50)
Prothrombin Time: 34.1 s — ABNORMAL HIGH (ref 11.6–15.2)

## 2010-06-19 ENCOUNTER — Encounter (INDEPENDENT_AMBULATORY_CARE_PROVIDER_SITE_OTHER): Payer: Self-pay | Admitting: Nurse Practitioner

## 2010-06-19 ENCOUNTER — Ambulatory Visit: Payer: Self-pay | Admitting: Physician Assistant

## 2010-06-20 ENCOUNTER — Telehealth (INDEPENDENT_AMBULATORY_CARE_PROVIDER_SITE_OTHER): Payer: Self-pay | Admitting: Nurse Practitioner

## 2010-06-20 LAB — CONVERTED CEMR LAB: Prothrombin Time: 26.5 s — ABNORMAL HIGH (ref 11.6–15.2)

## 2010-06-26 ENCOUNTER — Encounter: Payer: Self-pay | Admitting: Physician Assistant

## 2010-07-02 ENCOUNTER — Encounter: Payer: Self-pay | Admitting: Physician Assistant

## 2010-07-23 ENCOUNTER — Ambulatory Visit: Payer: Self-pay | Admitting: Nurse Practitioner

## 2010-07-24 LAB — CONVERTED CEMR LAB: Prothrombin Time: 20.1 s — ABNORMAL HIGH (ref 11.6–15.2)

## 2010-07-26 ENCOUNTER — Encounter: Payer: Self-pay | Admitting: Physician Assistant

## 2010-08-02 ENCOUNTER — Encounter (INDEPENDENT_AMBULATORY_CARE_PROVIDER_SITE_OTHER): Payer: Self-pay | Admitting: Nurse Practitioner

## 2010-08-02 ENCOUNTER — Ambulatory Visit: Payer: Self-pay | Admitting: Nurse Practitioner

## 2010-08-24 ENCOUNTER — Encounter (INDEPENDENT_AMBULATORY_CARE_PROVIDER_SITE_OTHER): Payer: Self-pay | Admitting: Nurse Practitioner

## 2010-08-31 ENCOUNTER — Telehealth (INDEPENDENT_AMBULATORY_CARE_PROVIDER_SITE_OTHER): Payer: Self-pay | Admitting: Internal Medicine

## 2010-09-16 ENCOUNTER — Encounter: Payer: Self-pay | Admitting: Family Medicine

## 2010-09-20 ENCOUNTER — Other Ambulatory Visit (HOSPITAL_COMMUNITY): Payer: Self-pay | Admitting: Internal Medicine

## 2010-09-20 DIAGNOSIS — Z1239 Encounter for other screening for malignant neoplasm of breast: Secondary | ICD-10-CM

## 2010-09-20 DIAGNOSIS — Z1231 Encounter for screening mammogram for malignant neoplasm of breast: Secondary | ICD-10-CM

## 2010-09-23 LAB — CONVERTED CEMR LAB
BUN: 18 mg/dL
CO2: 27 meq/L
Chloride: 104 meq/L
Prothrombin Time: 38.9 s
Vit D, 25-Hydroxy: 20 ng/mL

## 2010-09-25 NOTE — Progress Notes (Signed)
Summary: wants surgery referral  Phone Note Call from Patient   Summary of Call: Pt states her hernia is giving her some problems and would like to be referred to surgery to have it fixed. Initial call taken by: Vesta Mixer CMA,  October 02, 2009 9:18 AM  Follow-up for Phone Call        Referral is in system.  Made referral when she was here in Jan. Follow-up by: Tereso Newcomer PA-C,  October 02, 2009 2:19 PM  Additional Follow-up for Phone Call Additional follow up Details #1::        pt is aware of referral Additional Follow-up by: Armenia Shannon,  October 03, 2009 10:44 AM

## 2010-09-25 NOTE — Miscellaneous (Signed)
Summary: Open Repair Planned for Incisional Hernia with Dr. Luisa Hart  Eval by Dr. Luisa Hart 10/13/2009  plan open repair of incisional hernia  Clinical Lists Changes  Observations: Added new observation of PAST MED HX: Current Problems:  GASTRIC POLYP, HX OF (ICD-V12.79) GI BLEEDING (ICD-578.9) EDEMA (ICD-782.3) DYSLIPIDEMIA (ICD-272.4) DIABETES MELLITUS, TYPE II, WITH NEUROLOGICAL COMPLICATIONS (ICD-250.60) RETINOPATHY, DIABETIC, BACKGROUND (ICD-362.01) HYPERTENSION (ICD-401.9) DVT, HX OF (ICD-V12.51) DIABETES MELLITUS, TYPE II (ICD-250.00) Incisional Hernia   a.  eval by Dr. Harriette Bouillon 2.18.2011; open repair planned  (10/22/2009 11:14)        Past History:  Past Medical History: Current Problems:  GASTRIC POLYP, HX OF (ICD-V12.79) GI BLEEDING (ICD-578.9) EDEMA (ICD-782.3) DYSLIPIDEMIA (ICD-272.4) DIABETES MELLITUS, TYPE II, WITH NEUROLOGICAL COMPLICATIONS (ICD-250.60) RETINOPATHY, DIABETIC, BACKGROUND (ICD-362.01) HYPERTENSION (ICD-401.9) DVT, HX OF (ICD-V12.51) DIABETES MELLITUS, TYPE II (ICD-250.00) Incisional Hernia   a.  eval by Dr. Harriette Bouillon 2.18.2011; open repair planned

## 2010-09-25 NOTE — Miscellaneous (Signed)
Summary: Rehab Report/INITIAL SUMMARY  Rehab Report/INITIAL SUMMARY   Imported By: Arta Bruce 12/22/2009 09:46:36  _____________________________________________________________________  External Attachment:    Type:   Image     Comment:   External Document

## 2010-09-25 NOTE — Letter (Signed)
Summary: [facility]  [facility]   Imported By: Arta Bruce 05/17/2010 12:56:59  _____________________________________________________________________  External Attachment:    Type:   Image     Comment:   External Document

## 2010-09-25 NOTE — Progress Notes (Signed)
Summary: POWERCHAIR/ BRACE/DIABETIC SHOES  Phone Note Outgoing Call   Summary of Call: I received a request for a power seat and brace. I need to know if Mertie Clause requested this or not and for what reason.  Initial call taken by: Brynda Rim,  May 11, 2010 2:09 PM  Follow-up for Phone Call        pt says she did request this because of her PT...  pt says if medicad or medicare will pay for it she said she will try it...Marland KitchenMarland KitchenArmenia Shannon  May 11, 2010 3:30 PM   Additional Follow-up for Phone Call Additional follow up Details #1::        MS Botto CALLED TO SEE IF HER ORDER WILL BE SENT. SHE SAYS HER KNEE IS GOING OUT ALL THE TIME, AND ITS HARD TO GET UP AND DOWN FROM HER COUCH..  SHE SAYS THAT SHE CALLED IN EARLY FOR HER DIABETIC SHOES, LAST GOT THEM WAS 3 YRS AGO, AND THE REP IS COMING TO HER HOME TO GET HER FITTED FOR HER NEW SHOES, AND SCOTT NEEDS TO APPROVE SO  MEDICARE WILL COVER BEFORE OCT 1. SHE WAS DIAGNOISED AT [place], THAT HER LYMP NODES AREN'T WORKING. Additional Follow-up by: Leodis Rains,  May 22, 2010 10:25 AM    Additional Follow-up for Phone Call Additional follow up Details #2::    I have completed the forms for her knee braces.  They are in the basket for Velna Hatchet to pick up. I don't have back pain listed on her problem list.  One of the forms is for a back brace.   Does she need a back brace?  Does she have back pain?  How long?  Does it move anywhere (down her legs)?  I do not see any forms for diabetic shoes.  Have they been sent to me? Follow-up by: Tereso Newcomer PA-C,  June 06, 2010 1:28 PM  Additional Follow-up for Phone Call Additional follow up Details #3:: Details for Additional Follow-up Action Taken: pt says her back was hurting her that day but she doesn't need brace.... pt says she has not sent for the shoes..... Armenia Shannon  June 06, 2010 4:43 PM  Ok . . [facility] shred the back brace form request.  Have her send the  shoe request soon so I can complete it. Tereso Newcomer PA-C  June 06, 2010 5:15 PM  pt aware... Armenia Shannon  June 07, 2010 8:16 AM

## 2010-09-25 NOTE — Assessment & Plan Note (Signed)
Summary: 3 MONTH FU FOR DIABETES///KT   Vital Signs:  Patient profile:   75 year old female Height:      68 inches Weight:      263 pounds BMI:     40.13 Temp:     97.8 degrees F oral Pulse rate:   74 / minute Pulse rhythm:   regular Resp:     20 per minute BP sitting:   154 / 70  (left arm) Cuff size:   large  Vitals Entered By: Armenia Shannon (March 13, 2010 8:59 AM) CC: three month f/u.... refill norvasc.....Marland Kitchen pt says her legs are getting worst... Is Patient Diabetic? Yes Pain Assessment Patient in pain? no      CBG Result 143  Does patient need assistance? Functional Status Self care Ambulation Normal, Impaired:Risk for fall   Primary Care Provider:  Tereso Newcomer PA-C  CC:  three month f/u.... refill norvasc.....Marland Kitchen pt says her legs are getting worst....  History of Present Illness: Here for follow up.  Edema:  Left leg more swollen than right.  Noticing swelling in thigh now over last few weeks.  Taking Lasix 40 mg 3 tablets qam.  She was supposed to be on 60 in am and 40 in pm.  But, taking 3 tabs in am due to hard time cutting in 1/2 and increased urination late in the day.  No syncope.  No chest pain.  Notes increased DOE.  NYHA Class 2b (?).  Prolonged walking causes increased DOE.  Sleeps on one pillow.  No PND.  No cough or hemoptysis.  Tries to watch salt intake.  Does not wear compression hose like she should.  Does have some stockings but sounds like they are not so tight.  States she is going to [facility] with her friend in a few weeks and wants to know if she can make an appt there.    Problems Prior to Update: 1)  Diabetic Retinopathy  (ICD-250.50) 2)  Anemia  (ICD-285.9) 3)  Microalbuminuria  (ICD-791.0) 4)  Cerumen Impaction  (ICD-380.4) 5)  Ventral Hernia With Incarceration  (ICD-552.20) 6)  Urinalysis, Abnormal  (ICD-791.9) 7)  Knee Pain  (ICD-719.46) 8)  Vitamin D Deficiency  (ICD-268.9) 9)  Periumbilical Hernia  (ICD-553.1) 10)  Preventive Health Care   (ICD-V70.0) 11)  Heart Murmur, Systolic  (ICD-785.2) 12)  Viral Infection  (ICD-079.99) 13)  Leg Pain, Bilateral  (ICD-729.5) 14)  Unspecified Venous Insufficiency  (ICD-459.81) 15)  Coumadin Therapy  (ICD-V58.61) 16)  Herniorrhaphy, Hx of  (ICD-V45.89) 17)  Gastric Polyp, Hx of  (ICD-V12.79) 18)  Gi Bleeding  (ICD-578.9) 19)  Edema  (ICD-782.3) 20)  Dyslipidemia  (ICD-272.4) 21)  Diabetes Mellitus, Type II, With Neurological Complications  (ICD-250.60) 22)  Retinopathy, Diabetic, Background  (ICD-362.01) 23)  Hypertension  (ICD-401.9) 24)  Dvt, Hx of  (ICD-V12.51) 25)  Diabetes Mellitus, Type II  (ICD-250.00)  Current Medications (verified): 1)  Warfarin Sodium 5 Mg  Tabs (Warfarin Sodium) .... 2 By Mouth Every Day At 4 P.m. 2)  Glyburide-Metformin 5-500 Mg  Tabs (Glyburide-Metformin) .... 2 By Mouth Two Times A Day 3)  Amitriptyline Hcl 25 Mg  Tabs (Amitriptyline Hcl) .... 2 By Mouth At Bedtime 4)  Lipitor 20 Mg  Tabs (Atorvastatin Calcium) .Marland Kitchen.. 1 By Mouth Once Daily 5)  Lisinopril 40 Mg  Tabs (Lisinopril) .... One Tablet By Mouth Daily For Blood Pressure 6)  Lasix 40 Mg  Tabs (Furosemide) .... Take 1 1/2  Tablet By Mouth in  The Morning and 1 Tablet By Mouth in The Afternoon (Pharmacy Note Dose Increase) 7)  Norvasc 10 Mg  Tabs (Amlodipine Besylate) .... Once Daily 8)  Potassium Chloride Crys Cr 20 Meq Cr-Tabs (Potassium Chloride Crys Cr) .... One Tablet By Mouth Daily 9)  Knee High Ted Hose .... Place On Legs in Am and Wear Daily 10)  Neurontin 300 Mg Caps (Gabapentin) .... Take 1 Tablet By Mouth Three Times A Day 11)  Terazosin Hcl 2 Mg Caps (Terazosin Hcl) .... Take 3  Tabs By Mouth At Bedtime 12)  Calcium Carbonate-Vitamin D 600-400 Mg-Unit  Tabs (Calcium Carbonate-Vitamin D) .... Take 1 Tablet By Mouth Two Times A Day 13)  Oxycodone-Acetaminophen 5-500 Mg Caps (Oxycodone-Acetaminophen) 14)  Percocet 7.5-325 Mg Tabs (Oxycodone-Acetaminophen) .Marland Kitchen.. 1 By Mouth Every 4 Hours As  Needed For Pain 15)  Ferrous Sulfate 325 (65 Fe) Mg Tabs (Ferrous Sulfate) .... Take 1 Tablet By Mouth Three Times A Day  Allergies (verified): No Known Drug Allergies  Past History:  Past Medical History: Last updated: 12/07/2009 1. GASTRIC POLYP, HX OF (ICD-V12.79): with GI bleeding. 2. DYSLIPIDEMIA (ICD-272.4) 3. DIABETES MELLITUS, TYPE II, WITH NEUROLOGICAL COMPLICATIONS (ICD-250.60) 4. RETINOPATHY, DIABETIC, BACKGROUND (ICD-362.01) 5. HYPERTENSION (ICD-401.9) 6. 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  She is on permanent coumadin.  7. Incisional Hernia   a.  s/p  repair with Dr. Maisie Fus Cornett 8. S/p cholecystectomy 9. S/p hysterectomy 10. Obesity 11. Osteoarthritis 12. Diastolic CHF: Echo (7/10) with mod-severe concentric LV hypertrophy, no mid cavity or LVOT gradient, EF 65%, PASP 36 mmHg, no significant valvular abnormalities.   Past Surgical History: Last updated: 11/08/2009 Cholecystectomy Tubal ligation 11/07 - EGD: [facility]:  Gastric Polyps 10/10/06: EGD: [facility]  Inguinal herniorrhaphyx2 s/p TAH/BSO at age 69 s/p repair of ventral hernia (incarcerated) with mesh 3.2.2011 with Dr. Harriette Bouillon  Physical Exam  General:  alert, well-developed, and well-nourished.   Head:  normocephalic and atraumatic.   Neck:  no jvd at 90 degrees Lungs:  clear to ausc bilat no rales  no wheezing  Heart:  normal rate, regular rhythm, and no gallop.   Abdomen:  soft.   Msk:  trace presacral edema bilat Extremities:  2+ left pedal edema and 2+ right pedal edema.   Neurologic:  alert & oriented X3 and cranial nerves II-XII intact.   Psych:  normally interactive.     Impression & Recommendations:  Problem # 1:  EDEMA (ICD-782.3)  probably related to chronic venous stasis from h/o DVTs she also has diastolic dysfxn and likely has some contribution to her edema from this lungs are clear; no JVD at 90  degrees she is already taking 120 mg of lasix in the am no lasix this am will increase to Lasix 80 mg by mouth two times a day  will get her fitted for compression stockings (lower strength at first with the degree of her edema) . . . then increase to higher strength   Her updated medication list for this problem includes:    Lasix 40 Mg Tabs (Furosemide) .Marland Kitchen... Take 2 tabs by mouth two times a day  Orders: T-Basic Metabolic Panel [id number])  Problem # 2:  HYPERTENSION (ICD-401.9) up some will see if higher dose of lasix helps  Her updated medication list for this problem includes:    Lisinopril 40 Mg Tabs (Lisinopril) ..... One tablet by mouth daily for blood pressure    Lasix 40 Mg  Tabs (Furosemide) .Marland Kitchen... Take 2 tabs by mouth two times a day    Norvasc 10 Mg Tabs (Amlodipine besylate) ..... Once daily    Terazosin Hcl 2 Mg Caps (Terazosin hcl) .Marland Kitchen... Take 3  tabs by mouth at bedtime  Orders: T-Basic Metabolic Panel [id number])  Problem # 3:  DIABETES MELLITUS, TYPE II (ICD-250.00) A1C 7.6 will have her see dietician and monitor if she gets to 8, will have to adjust meds  Her updated medication list for this problem includes:    Glyburide-metformin 5-500 Mg Tabs (Glyburide-metformin) .Marland Kitchen... 2 by mouth two times a day    Lisinopril 40 Mg Tabs (Lisinopril) ..... One tablet by mouth daily for blood pressure  Orders: Capillary Blood Glucose/CBG (88416) Hemoglobin A1C (60630) T-Basic Metabolic Panel ([id number])  Problem # 4:  ANEMIA (ICD-285.9) reck labs today her stool cards were neg x 2 recently and she is on iron colo done in 2008  Her updated medication list for this problem includes:    Ferrous Sulfate 325 (65 Fe) Mg Tabs (Ferrous sulfate) .Marland Kitchen... Take 1 tablet by mouth three times a day  Orders: T-CBC w/Diff ([id number])  Problem # 5:  COUMADIN THERAPY (ICD-V58.61) check INR today  Orders: T-Protime, Auto ([id number])  Problem # 6:  UNSPECIFIED VENOUS  INSUFFICIENCY (ICD-459.81) as above  Complete Medication List: 1)  Warfarin Sodium 5 Mg Tabs (Warfarin sodium) .... 2 by mouth every day at 4 p.m. 2)  Glyburide-metformin 5-500 Mg Tabs (Glyburide-metformin) .... 2 by mouth two times a day 3)  Amitriptyline Hcl 25 Mg Tabs (Amitriptyline hcl) .... 2 by mouth at bedtime 4)  Lipitor 20 Mg Tabs (Atorvastatin calcium) .Marland Kitchen.. 1 by mouth once daily 5)  Lisinopril 40 Mg Tabs (Lisinopril) .... One tablet by mouth daily for blood pressure 6)  Lasix 40 Mg Tabs (Furosemide) .... Take 2 tabs by mouth two times a day 7)  Norvasc 10 Mg Tabs (Amlodipine besylate) .... Once daily 8)  Potassium Chloride Crys Cr 20 Meq Cr-tabs (Potassium chloride crys cr) .... One tablet by mouth daily 9)  Neurontin 300 Mg Caps (Gabapentin) .... Take 1 tablet by mouth three times a day 10)  Terazosin Hcl 2 Mg Caps (Terazosin hcl) .... Take 3  tabs by mouth at bedtime 11)  Calcium Carbonate-vitamin D 600-400 Mg-unit Tabs (Calcium carbonate-vitamin d) .... Take 1 tablet by mouth two times a day 12)  Oxycodone-acetaminophen 5-500 Mg Caps (Oxycodone-acetaminophen) 13)  Percocet 7.5-325 Mg Tabs (Oxycodone-acetaminophen) .Marland Kitchen.. 1 by mouth every 4 hours as needed for pain 14)  Ferrous Sulfate 325 (65 Fe) Mg Tabs (Ferrous sulfate) .... Take 1 tablet by mouth three times a day 15)  Compression Stockings  .... Bilateral thigh high; 20-30 mmhg  Patient Instructions: 1)  Schedule appointment with Drucilla Schmidt for diet education with diabetes. 2)  Change Lasix to 40 mg take 2 tabs in the am and 2 tabs in afternoon. 3)  [facility] to [facility] on [address] to get fitted for your stockings. 4)  Wear stockings from early in the morning until you go to bed at night. 5)  Prop your legs up when you are sitting at the level of your heart. 6)  I sent a new prescription for Lasix and your refill for Norvasc to [facility] on [address] 7)  Schedule a follow up with Annel Zunker in 2-3 weeks for your  swelling. Prescriptions: COMPRESSION STOCKINGS Bilateral Thigh high; 20-30 mmHg  #1 pair x 1   Entered and Authorized by:  Tereso Newcomer PA-C   Signed by:   Tereso Newcomer PA-C on 03/13/2010   Method used:   Print then Give to Patient   RxID:   [id number] LASIX 40 MG  TABS (FUROSEMIDE) Take 2 tabs by mouth two times a day  #120 x 3   Entered and Authorized by:   Tereso Newcomer PA-C   Signed by:   Tereso Newcomer PA-C on 03/13/2010   Method used:   Electronically to        [facility] [phone]* (retail)       [address]       [place], [place]  [address]       Ph: [phone]       Fax: [phone]   RxID:   [id number] NORVASC 10 MG  TABS (AMLODIPINE BESYLATE) once daily  #30 Each x 11   Entered and Authorized by:   Tereso Newcomer PA-C   Signed by:   Tereso Newcomer PA-C on 03/13/2010   Method used:   Electronically to        [facility] [phone]* (retail)       [address]       [place], [place]  [address]       Ph: [phone]       Fax: [phone]   RxID:   [id number]   Laboratory Results   Blood Tests     HGBA1C: 7.6%   (Normal Range: Non-Diabetic - 3-6%   Control Diabetic - 6-8%) CBG Random:: 143mg /dL

## 2010-09-25 NOTE — Miscellaneous (Signed)
Summary: Rehab Report//CHANG IN MEDICAL STATUS  Rehab Report//CHANG IN MEDICAL STATUS   Imported By: Arta Bruce 01/11/2010 14:54:47  _____________________________________________________________________  External Attachment:    Type:   Image     Comment:   External Document

## 2010-09-25 NOTE — Letter (Signed)
Summary: [facility]  [facility]   Imported By: Roderic Ovens 11/07/2009 12:33:27  _____________________________________________________________________  External Attachment:    Type:   Image     Comment:   External Document

## 2010-09-25 NOTE — Letter (Signed)
Summary: *HSN Results Follow up  [facility]  [address] [place], [place] [address]   Phone: [phone]  Fax: [phone]      05/01/2010   SAMANDA BUSKE [address] [place], [place]  [address]   Dear  Ms. Kimetha Sheridan,                            ____S.Drinkard,FNP   ____D. Gore,FNP       ____B. McPherson,MD   ____V. Rankins,MD    ____E. Mulberry,MD    ____N. Daphine Deutscher, FNP  ____D. Reche Dixon, MD    ____K. Philipp Deputy, MD    ____Other     This letter is to inform you that your recent test(s):  _______Pap Smear    ___X____Lab Test     _______X-ray    ___X____ is within acceptable limits  _______ requires a medication change  _______ requires a follow-up lab visit  _______ requires a follow-up visit with your provider   Comments: Please continue the iron medicine and repeat lab test in three months.       _________________________________________________________ If you have any questions, please contact our office                     Sincerely,  Armenia Shannon [facility]

## 2010-09-25 NOTE — Progress Notes (Signed)
  Phone Note Outgoing Call   Summary of Call: Knee xrays demonstrate arthritis, as I suspected. Ask patient if she would be willing and able to go to PT.  I think it would help. Let me know what she says. Initial call taken by: Brynda Rim,  September 18, 2009 8:44 AM  Follow-up for Phone Call        spoke with pt and she is understands that the PT will help.... pt will do the PT Follow-up by: Armenia Shannon,  September 18, 2009 9:06 AM  Additional Follow-up for Phone Call Additional follow up Details #1::        order in system Additional Follow-up by: Brynda Rim,  September 18, 2009 9:17 AM

## 2010-09-25 NOTE — Progress Notes (Signed)
Summary: Anemia Question  Phone Note Outgoing Call   Summary of Call: I spoke to Ms. Howell and explained to her why I want her to start the iron.  She needs to complete her stool cards and get a repeat CBC in 3 weeks. Initial call taken by: Brynda Rim,  Jan 03, 2010 4:19 PM  Follow-up for Phone Call        pt is aware and will come on May 16 Follow-up by: Armenia Shannon,  Jan 04, 2010 4:00 PM

## 2010-09-25 NOTE — Letter (Signed)
Summary: [facility] Note  [facility] Note   Imported By: Roderic Ovens 11/07/2009 12:30:44  _____________________________________________________________________  External Attachment:    Type:   Image     Comment:   External Document

## 2010-09-25 NOTE — Progress Notes (Signed)
Summary: PT referral  Phone Note Outgoing Call   Summary of Call: Refer to PT at [facility].  Specifically she needs to see Alvira Monday.  Phone number is [phone].  If she cannot see her, let me know.  I will need to try to send her to [facility] or [place]. Initial call taken by: Tereso Newcomer PA-C,  April 04, 2010 9:50 AM  Follow-up for Phone Call        ROBIN ONLY TAKE 3 AT THE TIME AND RIGHT KNOW THEY ARE SEVERAL IN FRONT OF [place] . SHE WILL BE  IN A WAITING LIST  Follow-up by: Cheryll Dessert,  April 05, 2010 2:35 PM  Additional Follow-up for Phone Call Additional follow up Details #1::        Any idea how long she may have to wait? Tereso Newcomer PA-C  April 05, 2010 5:19 PM     Additional Follow-up for Phone Call Additional follow up Details #2::    I spoke to Zella Ball and he said 6 months waiting cause right know she is working in November 2010 waiting list  Follow-up by: Cheryll Dessert,  April 06, 2010 12:10 PM  Additional Follow-up for Phone Call Additional follow up Details #3:: Details for Additional Follow-up Action Taken: Please check with Mertie Clause and make sure this is ok first. Please try to send her to [facility] or [place].  The [facility] on [facility] can tell you where to go.  The PT's name is Marcy Panning.  The phone number is [phone]. She needs to be seen for her lower extremity edema and deconditioning. The clinic in [facility] and [place] both can treat the edema. Tereso Newcomer PA-C  April 06, 2010 2:39 PM I Call [facility] and they told me to send the referral to [facility] and she said that it will take lees than 1 month for the pt to have an appt. I send the referral today .   Thank you. Tereso Newcomer PA-C  April 15, 2010 8:40 PM  Additional Follow-up by: Cheryll Dessert,  April 11, 2010 9:09 AM

## 2010-09-25 NOTE — Letter (Signed)
Summary: Discharge Summary  Discharge Summary   Imported By: Arta Bruce 01/05/2010 15:36:10  _____________________________________________________________________  External Attachment:    Type:   Image     Comment:   External Document

## 2010-09-25 NOTE — Assessment & Plan Note (Signed)
Summary: 2-3 WEEK FU/SWELLING///KT   Vital Signs:  Patient profile:   75 year old female Height:      68 inches Weight:      262 pounds BMI:     39.98 Temp:     97.8 degrees F oral Pulse rate:   74 / minute Pulse rhythm:   regular Resp:     22 per minute BP sitting:   150 / 71  (left arm) Cuff size:   large  Vitals Entered By: Armenia Shannon (April 04, 2010 8:28 AM)  Serial Vital Signs/Assessments:  Time      Position  BP       Pulse  Resp  Temp     By 9:32 AM             116/60                         Tereso Newcomer PA-C  CC: f/u on swelling.... pt says her left leg swells really bad at night... Is Patient Diabetic? Yes Pain Assessment Patient in pain? no      CBG Result 186  Does patient need assistance? Ambulation Normal, Impaired:Risk for fall   Primary Care Shakti Fleer:  Tereso Newcomer PA-C  CC:  f/u on swelling.... pt says her left leg swells really bad at night....  History of Present Illness: 75 year old female presents for followup on edema.  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  She is on chronic Coumadin therapy.  Of note, she does have a history of GI bleeding in the past as well.  Her hemoglobin has been low work than baseline recently.  This was noted after her recent hernia surgery.  Her hemoglobins have been stable.  She is currently on iron.  Her stool cards were negative x2.  She continues to have increasing lower extremity edema.  She was unable to get any compression stockings.  She has had chronic edema since her DVT history.  She began noting worsening problems after her hernia surgery earlier this year.  She was basically out of commission for about 2 months.  She is able to walk up and down a flight of stairs in her apartment building.  There are 15 steps.  She has no problems with this.  When she walks around outside, she does get short of breath often has to sit down.  She denies orthopnea.  She denies PND.  She denies syncope or chest pain.  She  denies significant cough.  She is taking the increased dose of Lasix.  She is now complaining of left knee and left hip pain.  She seems to be describing tendinitis along the lateral thigh of her left leg.  She states that she does not want any procedures done on her knee her hip.  She does walk with a cane.  Problems Prior to Update: 1)  Diabetic Retinopathy  (ICD-250.50) 2)  Anemia  (ICD-285.9) 3)  Microalbuminuria  (ICD-791.0) 4)  Cerumen Impaction  (ICD-380.4) 5)  Ventral Hernia With Incarceration  (ICD-552.20) 6)  Urinalysis, Abnormal  (ICD-791.9) 7)  Knee Pain  (ICD-719.46) 8)  Vitamin D Deficiency  (ICD-268.9) 9)  Periumbilical Hernia  (ICD-553.1) 10)  Preventive Health Care  (ICD-V70.0) 11)  Heart Murmur, Systolic  (ICD-785.2) 12)  Viral Infection  (ICD-079.99) 13)  Leg Pain, Bilateral  (ICD-729.5) 14)  Unspecified Venous Insufficiency  (ICD-459.81) 15)  Coumadin Therapy  (ICD-V58.61) 16)  Herniorrhaphy, Hx  of  (ICD-V45.89) 17)  Gastric Polyp, Hx of  (ICD-V12.79) 18)  Gi Bleeding  (ICD-578.9) 19)  Edema  (ICD-782.3) 20)  Dyslipidemia  (ICD-272.4) 21)  Diabetes Mellitus, Type II, With Neurological Complications  (ICD-250.60) 22)  Retinopathy, Diabetic, Background  (ICD-362.01) 23)  Hypertension  (ICD-401.9) 24)  Dvt, Hx of  (ICD-V12.51) 25)  Diabetes Mellitus, Type II  (ICD-250.00)  Medications Prior to Update: 1)  Warfarin Sodium 5 Mg  Tabs (Warfarin Sodium) .... 2 By Mouth Every Day At 4 P.m. 2)  Glyburide-Metformin 5-500 Mg  Tabs (Glyburide-Metformin) .... 2 By Mouth Two Times A Day 3)  Amitriptyline Hcl 25 Mg  Tabs (Amitriptyline Hcl) .... 2 By Mouth At Bedtime 4)  Lipitor 20 Mg  Tabs (Atorvastatin Calcium) .Marland Kitchen.. 1 By Mouth Once Daily 5)  Lisinopril 40 Mg  Tabs (Lisinopril) .... One Tablet By Mouth Daily For Blood Pressure 6)  Lasix 40 Mg  Tabs (Furosemide) .... Take 2 Tabs By Mouth Two Times A Day 7)  Norvasc 10 Mg  Tabs (Amlodipine Besylate) .... Once Daily 8)   Potassium Chloride Crys Cr 20 Meq Cr-Tabs (Potassium Chloride Crys Cr) .... One Tablet By Mouth Daily 9)  Neurontin 300 Mg Caps (Gabapentin) .... Take 1 Tablet By Mouth Three Times A Day 10)  Terazosin Hcl 2 Mg Caps (Terazosin Hcl) .... Take 3  Tabs By Mouth At Bedtime 11)  Calcium Carbonate-Vitamin D 600-400 Mg-Unit  Tabs (Calcium Carbonate-Vitamin D) .... Take 1 Tablet By Mouth Two Times A Day 12)  Oxycodone-Acetaminophen 5-500 Mg Caps (Oxycodone-Acetaminophen) 13)  Percocet 7.5-325 Mg Tabs (Oxycodone-Acetaminophen) .Marland Kitchen.. 1 By Mouth Every 4 Hours As Needed For Pain 14)  Ferrous Sulfate 325 (65 Fe) Mg Tabs (Ferrous Sulfate) .... Take 1 Tablet By Mouth Three Times A Day 15)  Compression Stockings .... Bilateral Thigh High; 20-30 Mmhg  Current Medications (verified): 1)  Warfarin Sodium 5 Mg  Tabs (Warfarin Sodium) .... 2 By Mouth Every Day At 4 P.m. 2)  Glyburide-Metformin 5-500 Mg  Tabs (Glyburide-Metformin) .... 2 By Mouth Two Times A Day 3)  Amitriptyline Hcl 25 Mg  Tabs (Amitriptyline Hcl) .... 2 By Mouth At Bedtime 4)  Lipitor 20 Mg  Tabs (Atorvastatin Calcium) .Marland Kitchen.. 1 By Mouth Once Daily 5)  Lisinopril 40 Mg  Tabs (Lisinopril) .... One Tablet By Mouth Daily For Blood Pressure 6)  Lasix 40 Mg  Tabs (Furosemide) .... Take One and One-Half Tabs By Mouth Every Day in The Morning and Take One Tab By Mouth in The Afternoon 7)  Norvasc 10 Mg  Tabs (Amlodipine Besylate) .... Once Daily 8)  Potassium Chloride Crys Cr 20 Meq Cr-Tabs (Potassium Chloride Crys Cr) .... One Tablet By Mouth Daily 9)  Neurontin 300 Mg Caps (Gabapentin) .... Take 1 Tablet By Mouth Three Times A Day 10)  Terazosin Hcl 2 Mg Caps (Terazosin Hcl) .... Take 3  Tabs By Mouth At Bedtime 11)  Calcium Carbonate-Vitamin D 600-400 Mg-Unit  Tabs (Calcium Carbonate-Vitamin D) .... Take 1 Tablet By Mouth Two Times A Day 12)  Oxycodone-Acetaminophen 5-500 Mg Caps (Oxycodone-Acetaminophen) 13)  Percocet 7.5-325 Mg Tabs  (Oxycodone-Acetaminophen) .Marland Kitchen.. 1 By Mouth Every 4 Hours As Needed For Pain 14)  Ferrous Sulfate 325 (65 Fe) Mg Tabs (Ferrous Sulfate) .... Take 1 Tablet By Mouth Three Times A Day 15)  Compression Stockings .... Bilateral Thigh High; 20-30 Mmhg  Allergies (verified): No Known Drug Allergies  Physical Exam  General:  alert, well-developed, and well-nourished.   Head:  normocephalic  and atraumatic.   Neck:  no jvd at 90 degrees Lungs:  clear to ausc bilat no rales  no wheezing  Heart:  normal rate, regular rhythm, and no gallop.   Abdomen:  soft.   Extremities:  2+ left pedal edema and 2+ right pedal edema.   Neurologic:  alert & oriented X3 and cranial nerves II-XII intact.   Psych:  normally interactive.     Impression & Recommendations:  Problem # 1:  EDEMA (ICD-782.3)  I am uncomfortable increasing her lasix any further I believe she has chronic swelling from chronic venous stasis disease check BNP as she does have diastolic dysfxn if elevated, consider changing to demadex d/w PT at [facility] (they do lymphedema - [phone]) she suggested sending to PT at [facility] who does treat LE edema . Marland Kitchen Alvira Monday [phone]) will also have her seen for deconditioning as I think this is the cause of her SOB in addn to her LE edema will check venous dopplers to confirm she has no clot . . . INRs have been therapeutic of late    Her updated medication list for this problem includes:    Lasix 40 Mg Tabs (Furosemide) .Marland Kitchen... Take 2 by mouth two times a day  Orders: T-Comprehensive Metabolic Panel [id number]) T-BNP  (B Natriuretic Peptide) (21308-65784) LE Venous Duplex (DVT) (DVT) Physical Therapy Referral (PT)  Problem # 2:  DYSPNEA (ICD-786.05)  this all began after her hernia surgery which was extensive her Hgb levels have been lower but have been stable and she is on iron stool cards were neg as above, I suspect she has been deconditioned since her surgery in  addn to her LE edema will refer to PT  Orders: T-Comprehensive Metabolic Panel ([id number]) T-BNP  (B Natriuretic Peptide) ([id number]) Physical Therapy Referral (PT)  Problem # 3:  HYPERTENSION (ICD-401.9) Assessment: Improved  repeat by me 116/60  Her updated medication list for this problem includes:    Lisinopril 40 Mg Tabs (Lisinopril) ..... One tablet by mouth daily for blood pressure    Lasix 40 Mg Tabs (Furosemide) .Marland Kitchen... Take 2 by mouth two times a day    Norvasc 10 Mg Tabs (Amlodipine besylate) ..... Once daily    Terazosin Hcl 2 Mg Caps (Terazosin hcl) .Marland Kitchen... Take 3  tabs by mouth at bedtime  Orders: T-Comprehensive Metabolic Panel ([id number])  Problem # 4:  ANEMIA (ICD-285.9) taking iron repeat CBC 04/2010 if Hgb lower or unchanged, consider referral back to GI with prior h/o GI bleeding and need for chronic coumadin Rx  Her updated medication list for this problem includes:    Ferrous Sulfate 325 (65 Fe) Mg Tabs (Ferrous sulfate) .Marland Kitchen... Take 1 tablet by mouth three times a day  Problem # 5:  DIABETES MELLITUS, TYPE II (ICD-250.00)  f/u in 2 mos if A1C over 8, consider insulin  Her updated medication list for this problem includes:    Glyburide-metformin 5-500 Mg Tabs (Glyburide-metformin) .Marland Kitchen... 2 by mouth two times a day    Lisinopril 40 Mg Tabs (Lisinopril) ..... One tablet by mouth daily for blood pressure  Orders: Capillary Blood Glucose/CBG (82948) Hemoglobin A1C (83036)  Complete Medication List: 1)  Warfarin Sodium 5 Mg Tabs (Warfarin sodium) .... 2 by mouth every day at 4 p.m. 2)  Glyburide-metformin 5-500 Mg Tabs (Glyburide-metformin) .... 2 by mouth two times a day 3)  Amitriptyline Hcl 25 Mg Tabs (Amitriptyline hcl) .... 2 by mouth at bedtime 4)  Lipitor 20 Mg Tabs (  Atorvastatin calcium) .Marland Kitchen.. 1 by mouth once daily 5)  Lisinopril 40 Mg Tabs (Lisinopril) .... One tablet by mouth daily for blood pressure 6)  Lasix 40 Mg Tabs (Furosemide) .... Take  2 by mouth two times a day 7)  Norvasc 10 Mg Tabs (Amlodipine besylate) .... Once daily 8)  Potassium Chloride Crys Cr 20 Meq Cr-tabs (Potassium chloride crys cr) .... One tablet by mouth daily 9)  Neurontin 300 Mg Caps (Gabapentin) .... Take 1 tablet by mouth three times a day 10)  Terazosin Hcl 2 Mg Caps (Terazosin hcl) .... Take 3  tabs by mouth at bedtime 11)  Calcium Carbonate-vitamin D 600-400 Mg-unit Tabs (Calcium carbonate-vitamin d) .... Take 1 tablet by mouth two times a day 12)  Oxycodone-acetaminophen 5-500 Mg Caps (Oxycodone-acetaminophen) 13)  Percocet 7.5-325 Mg Tabs (Oxycodone-acetaminophen) .Marland Kitchen.. 1 by mouth every 4 hours as needed for pain 14)  Ferrous Sulfate 325 (65 Fe) Mg Tabs (Ferrous sulfate) .... Take 1 tablet by mouth three times a day 15)  Compression Stockings  .... Bilateral thigh high; 20-30 mmhg  Patient Instructions: 1)  Schedule appointment with Susie for diabetic diet education. 2)  Schedule lab visit for the week of 04/26/2010:  CBC (Dx:  285.9). 3)  You may use Tylenol (Acetaminophen) 500 mg 1-2 tabs by mouth every 6 hours as needed for pain. 4)  Please schedule a follow-up appointment in 2 months with Scott for diabetes and swelling.  5)  I am going to refer you to a Physical Therapist to help you with increasing activity and for your swelling.  Please call if you do not get notified about an appointment in the next 2 weeks. 6)  Keep taking Lasix (furosemide) 40 mg 2 tabs by mouth two times a day. 7)  Return sooner if you feel worse.  Laboratory Results   Blood Tests   Date/Time Received: April 04, 2010 8:46 AM   HGBA1C: 7.7%   (Normal Range: Non-Diabetic - 3-6%   Control Diabetic - 6-8%) CBG Random:: 186mg /dL     Prevention & Chronic Care Immunizations   Influenza vaccine: Historical  (06/22/2008)    Tetanus booster: 10/21/2004: Historical    Pneumococcal vaccine: Historical  (10/21/2005)    H. zoster vaccine: Not documented  Colorectal  Screening   Hemoccult: NEG x 2  (02/12/2010)    Colonoscopy: Results: Diverticulosis.       Location:  [facility].     (10/24/2006)   Colonoscopy action/deferral: Repeat colonoscopy in 10 years.    (10/24/2006)  Other Screening   Pap smear: Not documented    Mammogram: ASSESSMENT: Negative - BI-RADS 1^MM DIGITAL SCREENING  (09/07/2009)    DXA bone density scan: Lumbar Spine:  T Score-2.5 to -1.0 Spine.   Hip Total: T Score -2.5 to -1.0 Hip.    Done at [facility]  (03/21/2009)   Smoking status: never  (04/10/2007)  Diabetes Mellitus   HgbA1C: 7.7  (04/04/2010)    Eye exam: Retasure:  Diabetic Retinopathy  (12/20/2009)    Foot exam: yes  (12/07/2009)   High risk foot: Not documented   Foot care education: Not documented    Urine microalbumin/creatinine ratio: 23.0  (12/07/2009)  Lipids   Total Cholesterol: 140  (12/21/2009)   LDL: 57  (12/21/2009)   LDL Direct: Not documented   HDL: 58  (12/21/2009)   Triglycerides: 123  (12/21/2009)    SGOT (AST): 15  (12/21/2009)   SGPT (ALT): 13  (12/21/2009) CMP ordered  Alkaline phosphatase: 58  (12/21/2009)   Total bilirubin: 0.4  (12/21/2009)  Hypertension   Last Blood Pressure: 150 / 71  (04/04/2010)   Serum creatinine: 0.95  (03/13/2010)   Serum potassium 4.5  (03/13/2010) CMP ordered   Self-Management Support :    Diabetes self-management support: Not documented    Hypertension self-management support: Not documented    Lipid self-management support: Not documented    Appended Document: 2-3 WEEK FU/SWELLING///KT    Clinical Lists Changes  Problems: Assessed EDEMA as comment only - rec'd call from vascular lab + old clot in left leg no acute clot  she has postphlebitic syndrome  Her updated medication list for this problem includes:    Lasix 40 Mg Tabs (Furosemide) .Marland Kitchen... Take 2 by mouth two times a day         Impression & Recommendations:  Problem # 1:   EDEMA (ICD-782.3) rec'd call from vascular lab + old clot in left leg no acute clot  she has postphlebitic syndrome  Her updated medication list for this problem includes:    Lasix 40 Mg Tabs (Furosemide) .Marland Kitchen... Take 2 by mouth two times a day  Complete Medication List: 1)  Warfarin Sodium 5 Mg Tabs (Warfarin sodium) .... 2 by mouth every day at 4 p.m. 2)  Glyburide-metformin 5-500 Mg Tabs (Glyburide-metformin) .... 2 by mouth two times a day 3)  Amitriptyline Hcl 25 Mg Tabs (Amitriptyline hcl) .... 2 by mouth at bedtime 4)  Lipitor 20 Mg Tabs (Atorvastatin calcium) .Marland Kitchen.. 1 by mouth once daily 5)  Lisinopril 40 Mg Tabs (Lisinopril) .... One tablet by mouth daily for blood pressure 6)  Lasix 40 Mg Tabs (Furosemide) .... Take 2 by mouth two times a day 7)  Norvasc 10 Mg Tabs (Amlodipine besylate) .... Once daily 8)  Potassium Chloride Crys Cr 20 Meq Cr-tabs (Potassium chloride crys cr) .... One tablet by mouth daily 9)  Neurontin 300 Mg Caps (Gabapentin) .... Take 1 tablet by mouth three times a day 10)  Terazosin Hcl 2 Mg Caps (Terazosin hcl) .... Take 3  tabs by mouth at bedtime 11)  Calcium Carbonate-vitamin D 600-400 Mg-unit Tabs (Calcium carbonate-vitamin d) .... Take 1 tablet by mouth two times a day 12)  Oxycodone-acetaminophen 5-500 Mg Caps (Oxycodone-acetaminophen) 13)  Percocet 7.5-325 Mg Tabs (Oxycodone-acetaminophen) .Marland Kitchen.. 1 by mouth every 4 hours as needed for pain 14)  Ferrous Sulfate 325 (65 Fe) Mg Tabs (Ferrous sulfate) .... Take 1 tablet by mouth three times a day 15)  Compression Stockings  .... Bilateral thigh high; 20-30 mmhg

## 2010-09-25 NOTE — Letter (Signed)
Summary: [place] [facility] //OT DISCHARGE SUMMARY  [place] [facility] //OT DISCHARGE SUMMARY   Imported By: Arta Bruce 07/25/2010 10:11:29  _____________________________________________________________________  External Attachment:    Type:   Image     Comment:   External Document

## 2010-09-25 NOTE — Miscellaneous (Signed)
Summary: Needs INR   Rec'd notes from general surgeon about her surgery.   She was supposed to come in last week for INR. I have not seen anything come back yet. Make sure she comes in today or tomorrow for INR.   pt says she is coming today.. Vickie Pham  November 08, 2009 9:23 AM    Clinical Lists Changes  Problems: Added new problem of VENTRAL HERNIA WITH INCARCERATION (ICD-552.20) - Signed Assessed VENTRAL HERNIA WITH INCARCERATION as comment only - s/p repair with Dr. Karie Schwalbe. [place]ett 3.2.2011 - Signed Observations: Added new observation of PAST SURG HX: Cholecystectomy Tubal ligation 11/07 - EGD: [facility]:  Gastric Polyps 10/10/06: EGD: [facility]  Inguinal herniorrhaphyx2 s/p TAH/BSO at age 42 s/p repair of ventral hernia (incarcerated) with mesh 3.2.2011 with Dr. Maisie Fus Cornett (11/08/2009 8:25)       Past History:  Past Surgical History: Cholecystectomy Tubal ligation 11/07 - EGD: [facility]:  Gastric Polyps 10/10/06: EGD: [facility]  Inguinal herniorrhaphyx2 s/p TAH/BSO at age 31 s/p repair of ventral hernia (incarcerated) with mesh 3.2.2011 with Dr. Harriette Bouillon   Impression & Recommendations:  Problem # 1:  VENTRAL HERNIA WITH INCARCERATION (ICD-552.20) s/p repair with Dr. Karie Schwalbe. Cornett 3.2.2011  Complete Medication List: 1)  Warfarin Sodium 5 Mg Tabs (Warfarin sodium) .... 2 by mouth every day at 4 p.m. 2)  Glyburide-metformin 5-500 Mg Tabs (Glyburide-metformin) .... 2 by mouth two times a day 3)  Amitriptyline Hcl 25 Mg Tabs (Amitriptyline hcl) .... 2 by mouth at bedtime 4)  Lipitor 20 Mg Tabs (Atorvastatin calcium) .Marland Kitchen.. 1 by mouth once daily 5)  Lisinopril 40 Mg Tabs (Lisinopril) .... One tablet by mouth daily for blood pressure 6)  Lasix 40 Mg Tabs (Furosemide) .... Take 1 tablet by mouth two times a day 7)  Norvasc 10 Mg Tabs (Amlodipine besylate) .... Once daily 8)  Potassium Chloride Crys Cr 20 Meq Cr-tabs  (Potassium chloride crys cr) .... One tablet by mouth daily 9)  Knee High Ted Hose  .... Place on legs in am and wear daily 10)  Neurontin 300 Mg Caps (Gabapentin) .... Take 1 tablet by mouth three times a day 11)  Terazosin Hcl 2 Mg Caps (Terazosin hcl) .... Take 3  tabs by mouth at bedtime 12)  Calcium Carbonate-vitamin D 600-400 Mg-unit Tabs (Calcium carbonate-vitamin d) .... Take 1 tablet by mouth two times a day

## 2010-09-25 NOTE — Letter (Signed)
Summary: [facility]  [facility]   Imported By: Arta Bruce 02/05/2010 12:18:08  _____________________________________________________________________  External Attachment:    Type:   Image     Comment:   External Document

## 2010-09-25 NOTE — Letter (Signed)
Summary: DENIAL OF MEDICAL COVERAGE  DENIAL OF MEDICAL COVERAGE   Imported By: Arta Bruce 06/25/2010 09:30:21  _____________________________________________________________________  External Attachment:    Type:   Image     Comment:   External Document

## 2010-09-25 NOTE — Miscellaneous (Signed)
Summary: Retasure 11/2009 Abnormal  Clinical Lists Changes  Problems: Added new problem of DIABETIC  RETINOPATHY (ICD-250.50) Assessed DIABETIC  RETINOPATHY as comment only - [facility]asure Abnormal 11/2009 patient set up with [facility] (Dr. Fawn Kirk) 12/2009  Her updated medication list for this problem includes:    Glyburide-metformin 5-500 Mg Tabs (Glyburide-metformin) .Marland Kitchen... 2 by mouth two times a day    Lisinopril 40 Mg Tabs (Lisinopril) ..... One tablet by mouth daily for blood pressure  Observations: Added new observation of DIAB EYE EX: [facility]:  Diabetic Retinopathy (12/20/2009 16:57)       Impression & Recommendations:  Problem # 1:  DIABETIC  RETINOPATHY (ICD-250.50) [facility] Abnormal 11/2009 patient set up with [facility] (Dr. Fawn Kirk) 12/2009  Her updated medication list for this problem includes:    Glyburide-metformin 5-500 Mg Tabs (Glyburide-metformin) .Marland Kitchen... 2 by mouth two times a day    Lisinopril 40 Mg Tabs (Lisinopril) ..... One tablet by mouth daily for blood pressure  Complete Medication List: 1)  Warfarin Sodium 5 Mg Tabs (Warfarin sodium) .... 2 by mouth every day at 4 p.m. 2)  Glyburide-metformin 5-500 Mg Tabs (Glyburide-metformin) .... 2 by mouth two times a day 3)  Amitriptyline Hcl 25 Mg Tabs (Amitriptyline hcl) .... 2 by mouth at bedtime 4)  Lipitor 20 Mg Tabs (Atorvastatin calcium) .Marland Kitchen.. 1 by mouth once daily 5)  Lisinopril 40 Mg Tabs (Lisinopril) .... One tablet by mouth daily for blood pressure 6)  Lasix 40 Mg Tabs (Furosemide) .... Take 1 1/2  tablet by mouth in the morning and 1 tablet by mouth in the afternoon (pharmacy note dose increase) 7)  Norvasc 10 Mg Tabs (Amlodipine besylate) .... Once daily 8)  Potassium Chloride Crys Cr 20 Meq Cr-tabs (Potassium chloride crys cr) .... One tablet by mouth daily 9)  Knee High Ted Hose  .... Place on legs in am and wear daily 10)  Neurontin 300 Mg Caps (Gabapentin) .... Take 1 tablet by mouth  three times a day 11)  Terazosin Hcl 2 Mg Caps (Terazosin hcl) .... Take 3  tabs by mouth at bedtime 12)  Calcium Carbonate-vitamin D 600-400 Mg-unit Tabs (Calcium carbonate-vitamin d) .... Take 1 tablet by mouth two times a day 13)  Oxycodone-acetaminophen 5-500 Mg Caps (Oxycodone-acetaminophen) 14)  Percocet 7.5-325 Mg Tabs (Oxycodone-acetaminophen) .Marland Kitchen.. 1 by mouth every 4 hours as needed for pain 15)  Ferrous Sulfate 325 (65 Fe) Mg Tabs (Ferrous sulfate) .... Take 1 tablet by mouth three times a day

## 2010-09-25 NOTE — Miscellaneous (Signed)
Summary: AUTH.RELEASE INFO TO [facility] [employer] [facility]  AUTH.RELEASE INFO TO [facility]   Imported By: Arta Bruce 02/12/2010 09:10:40  _____________________________________________________________________  External Attachment:    Type:   Image     Comment:   External Document

## 2010-09-25 NOTE — Letter (Signed)
Summary: REFERRAL//PHYSICAL THERAPY  REFERRAL//PHYSICAL THERAPY   Imported By: Arta Bruce 11/07/2009 12:34:46  _____________________________________________________________________  External Attachment:    Type:   Image     Comment:   External Document

## 2010-09-25 NOTE — Progress Notes (Signed)
Summary: Lab results  Phone Note Outgoing Call   Summary of Call: notify pt that PT/INR is ok continue coumadin at current dose follow up in 4 weeks for repeat pt/inr (lab visit) Initial call taken by: Lehman Prom FNP,  June 20, 2010 9:28 AM  Follow-up for Phone Call        pt aware Follow-up by: Armenia Shannon,  June 21, 2010 11:04 AM

## 2010-09-25 NOTE — Miscellaneous (Signed)
Summary: Rehab Report//initial summary  Rehab Report//initial summary   Imported By: Arta Bruce 12/05/2009 14:12:56  _____________________________________________________________________  External Attachment:    Type:   Image     Comment:   External Document

## 2010-09-25 NOTE — Progress Notes (Signed)
Summary: medication refill  Phone Note Refill Request Message from:  Patient on February 08, 2010 8:53 AM  Refills Requested: Medication #1:  GLYBURIDE-METFORMIN 5-500 MG  TABS 2 by mouth two times a day  Medication #2:  NEURONTIN 300 MG CAPS Take 1 tablet by mouth three times a day  Medication #3:  TERAZOSIN HCL 2 MG CAPS Take 3  tabs by mouth at bedtime  Medication #4:  LASIX 40 MG  TABS Take 1 1/2  tablet by mouth in the morning and 1 tablet by mouth in the afternoon (pharmacy note dose increase) [facility] [address]   Method Requested: Fax to Local Pharmacy Initial call taken by: Armenia Shannon,  February 08, 2010 8:53 AM  Follow-up for Phone Call        Rx completed in Dr. Tiajuana Amass Follow-up by: Tereso Newcomer PA-C,  February 08, 2010 9:40 AM    Prescriptions: LASIX 40 MG  TABS (FUROSEMIDE) Take 1 1/2  tablet by mouth in the morning and 1 tablet by mouth in the afternoon (pharmacy note dose increase)  #90 x 5   Entered and Authorized by:   Tereso Newcomer PA-C   Signed by:   Tereso Newcomer PA-C on 02/08/2010   Method used:   Electronically to        [facility] [phone]* (retail)       [address]       [place], [place]  [address]       Ph: [phone]       Fax: [phone]   RxID:   [id number] TERAZOSIN HCL 2 MG CAPS (TERAZOSIN HCL) Take 3  tabs by mouth at bedtime  #90 x 6   Entered and Authorized by:   Tereso Newcomer PA-C   Signed by:   Tereso Newcomer PA-C on 02/08/2010   Method used:   Electronically to        [facility] [phone]* (retail)       [address]       [place], [place]  [address]       Ph: [phone]       Fax: [phone]   RxID:   [id number] NEURONTIN 300 MG CAPS (GABAPENTIN) Take 1 tablet by mouth three times a day  #90 x 6   Entered and Authorized by:   Tereso Newcomer PA-C   Signed by:   Tereso Newcomer PA-C on 02/08/2010   Method used:   Electronically to        [facility] [phone]* (retail)       [address]       [place],  [place]  [address]       Ph: [phone]       Fax: [phone]   RxID:   [id number] GLYBURIDE-METFORMIN 5-500 MG  TABS (GLYBURIDE-METFORMIN) 2 by mouth two times a day  #120 Each x 5   Entered and Authorized by:   Tereso Newcomer PA-C   Signed by:   Tereso Newcomer PA-C on 02/08/2010   Method used:   Electronically to        [facility] [phone]* (retail)       [address]       [place], [place]  [address]       Ph: [phone]       Fax: [phone]   RxID:   [id number]

## 2010-09-25 NOTE — Letter (Signed)
Summary: [facility]XED  [facility] /FAXED   Imported By: Arta Bruce 07/02/2010 10:10:42  _____________________________________________________________________  External Attachment:    Type:   Image     Comment:   External Document

## 2010-09-25 NOTE — Progress Notes (Signed)
Summary: coumadin  Phone Note Outgoing Call   Summary of Call: INR ok COntinue same dose of coumadin Repeat INR in 4 weeks  Initial call taken by: Brynda Rim,  April 03, 2010 9:54 AM  Follow-up for Phone Call        pt is aware Follow-up by: Armenia Shannon,  April 03, 2010 10:01 AM       Anticoagulation Management History:      She is being anticoagulated due to deep venous thrombosis and/or pulmonary embolism which has been recurrent and is associated with continued risk factors.  Positive risk factors for bleeding include an age of 73 years or older, history of GI bleeding, and presence of serious comorbidities.  The bleeding index is 'high risk'.  Positive CHADS2 values include History of HTN and History of Diabetes.  Negative CHADS2 values include Age > 73 years old.  Plans are to continue warfarin for life.  Her last INR was 2.53.     Anticoagulation Management Assessment/Plan:      The patient's current anticoagulation dose is Warfarin sodium 5 mg  tabs: 2 by mouth every day at 4 p.m..  The target INR is 2.0-3.0.  The next INR is due 05/01/2010.         Prior Anticoagulation Instructions: The patient is to hold one dose of coumadin.  The dosage to be resumed includes: 5 mg 2 tabs once daily.  Current Anticoagulation Instructions: The patient is to continue with the same dose of coumadin.  This dosage includes:  Warfarin sodium 5 mg  tabs: 2 by mouth every day at 4 p.m.Marland Kitchen

## 2010-09-25 NOTE — Letter (Signed)
Summary: [facility]  [facility]   Imported By: Arta Bruce 06/01/2010 10:13:19  _____________________________________________________________________  External Attachment:    Type:   Image     Comment:   External Document

## 2010-09-25 NOTE — Letter (Signed)
Summary: SS [employer] SUPPLY/ARTHRITIS GLOVES/KNEE BRACE  SS [employer] SUPPLY/ARTHRITIS GLOVES/KNEE BRACE   Imported By: Arta Bruce 06/07/2010 12:53:59  _____________________________________________________________________  External Attachment:    Type:   Image     Comment:   External Document

## 2010-09-25 NOTE — Assessment & Plan Note (Signed)
Summary: np6/abn ekg/surgical clearance   Visit Type:  Initial Consult Primary Provider:  Tereso Newcomer PA-C  CC:  Surgical clearance - Hernia.  History of Present Illness: 75 yo with history of several prior DVTs, HTN, diabetes, obesity presents for evaluation prior to surgery to repair an abdominal incisional hernia.  Patient is scheduled for surgery tomorrow by Dr. Luisa Hart.  She had an ECG done at the surgeon's office with Qs in V1 and V2 that was read as old anteroseptal MI.  ECG here today shows Q only in V1 and a left anterior fascicular block.  No significant Qs or evidence for prior MI.  The difference between the 2 ECGs is likely lead placement.  Qs in V1 and V2 only are nonspecific regardless.    Patient is able to climb up and down the steps at her house without problems.  She gets mildly short of breath walking to the mailbox.  She is not very active in general. No chest pain or tightness.  She reports prior DVTs x 3 with involvement of both legs, the last being in 2007.  There is chronic swelling in the lower legs bilaterally.  She is on permanent coumadin.  Coumadin was stopped Friday for surgery tomorrow.    ECG: NSR, left anterior fascicular block  Labs (2/11): K 4.2, creatinine 1.08  Current Medications (verified): 1)  Warfarin Sodium 5 Mg  Tabs (Warfarin Sodium) .... 2 By Mouth Every Day At 4 P.m. 2)  Glyburide-Metformin 5-500 Mg  Tabs (Glyburide-Metformin) .... 2 By Mouth Two Times A Day 3)  Amitriptyline Hcl 25 Mg  Tabs (Amitriptyline Hcl) .... 2 By Mouth At Bedtime 4)  Lipitor 20 Mg  Tabs (Atorvastatin Calcium) .Marland Kitchen.. 1 By Mouth Once Daily 5)  Lisinopril 40 Mg  Tabs (Lisinopril) .... One Tablet By Mouth Daily For Blood Pressure 6)  Lasix 40 Mg  Tabs (Furosemide) .... Take 1 Tablet By Mouth Two Times A Day 7)  Norvasc 10 Mg  Tabs (Amlodipine Besylate) .... Once Daily 8)  Potassium Chloride Crys Cr 20 Meq Cr-Tabs (Potassium Chloride Crys Cr) .... One Tablet By Mouth Daily 9)   Knee High Ted Hose .... Place On Legs in Am and Wear Daily 10)  Neurontin 300 Mg Caps (Gabapentin) .... Take 1 Tablet By Mouth Three Times A Day 11)  Terazosin Hcl 2 Mg Caps (Terazosin Hcl) .... Take 3  Tabs By Mouth At Bedtime 12)  Calcium Carbonate-Vitamin D 600-400 Mg-Unit  Tabs (Calcium Carbonate-Vitamin D) .... Take 1 Tablet By Mouth Two Times A Day  Allergies (verified): No Known Drug Allergies  Past History:  Past Medical History: 1. GASTRIC POLYP, HX OF (ICD-V12.79): with GI bleeding. 2. DYSLIPIDEMIA (ICD-272.4) 3. DIABETES MELLITUS, TYPE II, WITH NEUROLOGICAL COMPLICATIONS (ICD-250.60) 4. RETINOPATHY, DIABETIC, BACKGROUND (ICD-362.01) 5. HYPERTENSION (ICD-401.9) 6. 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  She is on permanent coumadin.  7. Incisional Hernia   a.  eval by Dr. Harriette Bouillon 2.18.2011; open repair planned 8. S/p cholecystectomy 9. S/p hysterectomy 10. Obesity 11. Osteoarthritis 12. Diastolic CHF: Echo (7/10) with mod-severe concentric LV hypertrophy, no mid cavity or LVOT gradient, EF 65%, PASP 36 mmHg, no significant valvular abnormalities.   Family History: Reviewed history from 04/10/2007 and no changes required. son died age 49 "clot" daughter died age 47 of cancer and "clot"  Social History: Reviewed history from 04/10/2007 and no changes required. Jehovah's Witness.No Blood Products. Pt carries paperwork on her. Quit  smoking 39 yrs ago Alcohol use-no Drug use-no  Review of Systems       All systems reviewed and negative except as per HPI.   Vital Signs:  Patient profile:   75 year old female Height:      68 inches Weight:      260.50 pounds BMI:     39.75 Pulse rate:   71 / minute Pulse rhythm:   regular Resp:     18 per minute BP sitting:   132 / 71  (left arm) Cuff size:   large  Vitals Entered By: Vikki Ports (October 24, 2009 10:29 AM)  Physical Exam  General:  Well developed, well nourished,  in no acute distress.  Obese.  Head:  normocephalic and atraumatic Nose:  no deformity, discharge, inflammation, or lesions Mouth:  Teeth, gums and palate normal. Oral mucosa normal. Neck:  Neck supple, no JVD. No masses, thyromegaly or abnormal cervical nodes. Lungs:  Clear bilaterally to auscultation and percussion. Heart:  Non-displaced PMI, chest non-tender; regular rate and rhythm, S1, S2 without rubs or gallops.  2/6 early systolic crescendo-decrescendo murmur RUSB.  Carotid upstroke normal, no bruit.  Pedals normal pulses. 2+ edema to knees bilaterally.  Abdomen:  Bowel sounds positive; abdomen soft and non-tender without masses, organomegaly, or hernias noted. No hepatosplenomegaly. Msk:  Back normal, normal gait. Muscle strength and tone normal. Extremities:  No clubbing or cyanosis. Neurologic:  Alert and oriented x 3. Skin:  Intact without lesions or rashes. Psych:  Normal affect.   Impression & Recommendations:  Problem # 1:  PRE-OPERATIVE EVALUATION Patient is scheduled for incisional hernia repair tomorrow.  I do not see evidence for an old MI on her ECG.  She has a left anterior fascicular block (no particular workup needed for this). She is able to exert herself to > 4 METS with no chest pain and minimal dyspnea.  I think that she is of reasonable risk to go on to surgery tomorrow.  I would not start a beta blocker on her as she has no known vascular disease and it is very close to the time of her surgery.    Problem # 2:  HEART MURMUR, SYSTOLIC (ICD-785.2) Echo done in 7/10 showed no significant valvular disease.   Problem # 3:  DVT, HX OF (ICD-V12.51) Patient has history of recurrent DVTs, last was in 2007.  She has been off coumadin since Friday and will have surgery tomorrow.  Would restart anticoagulation as soon as reasonably possible after surgery.   Problem # 4:  EDEMA (ICD-782.3) Patient has significant lower leg swelling but no JVD.  This may be venous  insufficiency post-DVTs in both legs.  There may be a contribution of diastolic CHF also given significant LVH on echo and history of HTN.  Needs good BP control.  Would continue current Lasix (would not push Lasix as she does not have much dyspnea and swelling may be due to venous insufficiency).   Problem # 5:  HYPERTENSION (ICD-401.9) BP appears well-controlled today on current regimen.

## 2010-09-25 NOTE — Letter (Signed)
Summary: [facility]  [facility]   Imported By: Arta Bruce 05/30/2010 10:35:08  _____________________________________________________________________  External Attachment:    Type:   Image     Comment:   External Document

## 2010-09-25 NOTE — Assessment & Plan Note (Signed)
Summary: pt inr & f/u with Dev[facility] Pavek///cns   Vital Signs:  Patient profile:   75 year old female Weight:      256.44 pounds Temp:     97.5 degrees F Pulse rate:   71 / minute Pulse rhythm:   regular Resp:     22 per minute BP sitting:   170 / 73  (left arm) Cuff size:   large  Vitals Entered By: Chauncy Passy, SMA  Serial Vital Signs/Assessments:  Time      Position  BP       Pulse  Resp  Temp     By 9:09 AM             130/70                         Tereso Newcomer PA-C 9:09 AM             140/70                         Tereso Newcomer PA-C  Comments: 9:09 AM left arm By: Tereso Newcomer PA-C  9:09 AM right arm By: Tereso Newcomer PA-C   CC: Pt. is here for a pt inr and f/u. -- Pt. is complaining of Left ear discomfort and moles in the abdom. , Hypertension Management Is Patient Diabetic? Yes Pain Assessment Patient in pain? yes     Location: abdomen Intensity: 6 Type: Throbbing Onset of pain  Constant CBG Result 95  Does patient need assistance? Functional Status Self care Ambulation Normal   Primary Care Provider:  Tereso Newcomer PA-C  CC:  Pt. is here for a pt inr and f/u. -- Pt. is complaining of Left ear discomfort and moles in the abdom.  and Hypertension Management.  History of Present Illness: Here for f/u.  Hernia:  Actually had to have 3 hernias repaired.  Still having a lot of pain.  Sees Dr. Luisa Hart May 6.  No vomiting.  Passing flatus.  Having good BMs.  No c/o new bulges.  HTN:  Taking meds.  In pain today from recent abdominal surgery.  Went to card. before surgery.  She was cleared and no w/u was recommended.  DM:  A1C 7.1 today.  Sugars at home under 200.  No hypoglycemic appts.  No eye appt in 2 years.  No foot c/o's.  Coumadin:  No bleeding problems.  Ear pain:  Notes right ear pain and thinks she has some wax.  No fever, chills, cough.  Hypertension History:      She complains of peripheral edema, but denies chest pain, dyspnea with exertion, and  syncope.        Positive major cardiovascular risk factors include female age 63 years old or older, diabetes, hyperlipidemia, and hypertension.  Negative major cardiovascular risk factors include non-tobacco-user status.     Problems Prior to Update: 1)  Microalbuminuria  (ICD-791.0) 2)  Cerumen Impaction  (ICD-380.4) 3)  Ventral Hernia With Incarceration  (ICD-552.20) 4)  Urinalysis, Abnormal  (ICD-791.9) 5)  Knee Pain  (ICD-719.46) 6)  Vitamin D Deficiency  (ICD-268.9) 7)  Periumbilical Hernia  (ICD-553.1) 8)  Preventive Health Care  (ICD-V70.0) 9)  Heart Murmur, Systolic  (ICD-785.2) 10)  Viral Infection  (ICD-079.99) 11)  Leg Pain, Bilateral  (ICD-729.5) 12)  Unspecified Venous Insufficiency  (ICD-459.81) 13)  Coumadin Therapy  (ICD-V58.61) 14)  Herniorrhaphy, Hx of  (ICD-V45.89) 15)  Gastric Polyp, Hx of  (ICD-V12.79) 16)  Gi Bleeding  (ICD-578.9) 17)  Edema  (ICD-782.3) 18)  Dyslipidemia  (ICD-272.4) 19)  Diabetes Mellitus, Type II, With Neurological Complications  (ICD-250.60) 20)  Retinopathy, Diabetic, Background  (ICD-362.01) 21)  Hypertension  (ICD-401.9) 22)  Dvt, Hx of  (ICD-V12.51) 23)  Diabetes Mellitus, Type II  (ICD-250.00)  Allergies (verified): No Known Drug Allergies  Past History:  Past Surgical History: Last updated: 11/08/2009 Cholecystectomy Tubal ligation 11/07 - EGD: [facility]:  Gastric Polyps 10/10/06: EGD: [facility]  Inguinal herniorrhaphyx2 s/p TAH/BSO at age 18 s/p repair of ventral hernia (incarcerated) with mesh 3.2.2011 with Dr. Harriette Bouillon  Past Medical History: 1. GASTRIC POLYP, HX OF (ICD-V12.79): with GI bleeding. 2. DYSLIPIDEMIA (ICD-272.4) 3. DIABETES MELLITUS, TYPE II, WITH NEUROLOGICAL COMPLICATIONS (ICD-250.60) 4. RETINOPATHY, DIABETIC, BACKGROUND (ICD-362.01) 5. HYPERTENSION (ICD-401.9) 6. 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  She is on permanent coumadin.  7.  Incisional Hernia   a.  s/p  repair with Dr. Maisie Fus Cornett 8. S/p cholecystectomy 9. S/p hysterectomy 10. Obesity 11. Osteoarthritis 12. Diastolic CHF: Echo (7/10) with mod-severe concentric LV hypertrophy, no mid cavity or LVOT gradient, EF 65%, PASP 36 mmHg, no significant valvular abnormalities.   Review of Systems GI:  Denies bloody stools, dark tarry stools, and vomiting blood. GU:  Denies hematuria.  Physical Exam  General:  alert, well-developed, and well-nourished.   Head:  normocephalic and atraumatic.   Eyes:  pupils equal, pupils round, and pupils reactive to light.   Ears:  L ear normal and R cerumen impaction.   Nose:  no external deformity.   Mouth:  pharynx pink and moist.   Lungs:  normal breath sounds and no wheezes.   Heart:  normal rate and regular rhythm.   Abdomen:  soft.   Extremities:  1+ left pedal edema and 1+ right pedal edema.   Neurologic:  alert & oriented X3 and cranial nerves II-XII intact.   Psych:  normally interactive.    Diabetes Management Exam:    Foot Exam (with socks and/or shoes not present):       Sensory-Monofilament:          Left foot: normal          Right foot: normal   Impression & Recommendations:  Problem # 1:  HYPERTENSION (ICD-401.9) still above goal may partly be up due to continued pain from recent hernia surgery review of records demonstrates she had side effects with brady from Toprol 50 in the past increase Lasix to 60 in AM and 40 in PM repeat bmet in 2 weeks   Her updated medication list for this problem includes:    Lisinopril 40 Mg Tabs (Lisinopril) ..... One tablet by mouth daily for blood pressure    Lasix 40 Mg Tabs (Furosemide) .Marland Kitchen... Take 1 1/2  tablet by mouth in the morning and 1 tablet by mouth in the afternoon (pharmacy note dose increase)    Norvasc 10 Mg Tabs (Amlodipine besylate) ..... Once daily    Terazosin Hcl 2 Mg Caps (Terazosin hcl) .Marland Kitchen... Take 3  tabs by mouth at bedtime  Problem # 2:   DIABETES MELLITUS, TYPE II (ICD-250.00)  A1C 7.1 today would not adjust anything at this time with her comorbidities, will accept this value  Her updated medication list for this problem includes:    Glyburide-metformin 5-500 Mg Tabs (Glyburide-metformin) .Marland Kitchen... 2 by mouth two times a day  Lisinopril 40 Mg Tabs (Lisinopril) ..... One tablet by mouth daily for blood pressure  Orders: Capillary Blood Glucose/CBG (82948) Hgb A1C (60454UJ) T-Urine Microalbumin w/creat. ratio [id number])  Problem # 3:  VENTRAL HERNIA WITH INCARCERATION (ICD-552.20) s/p repair still having some pain f/u with Dr. Luisa Hart as scheduled  Problem # 4:  DYSLIPIDEMIA (ICD-272.4) check FLP at next blood draw  Her updated medication list for this problem includes:    Lipitor 20 Mg Tabs (Atorvastatin calcium) .Marland Kitchen... 1 by mouth once daily  Problem # 5:  COUMADIN THERAPY (ICD-V58.61) check PT get stool cards had colo couple years ago Orders: Hemoccult Cards -3 specimans (take home) (30865) T-Protime, Auto ([id number])  Problem # 6:  PREVENTIVE HEALTH CARE (ICD-V70.0) colo done in recent past get stool cards   Orders: Hemoccult Cards -3 specimans (take home) (84132)  Problem # 7:  CERUMEN IMPACTION (ICD-380.4)  ear flush today  Orders: Cerumen Impaction Removal (44010)  Complete Medication List: 1)  Warfarin Sodium 5 Mg Tabs (Warfarin sodium) .... 2 by mouth every day at 4 p.m. 2)  Glyburide-metformin 5-500 Mg Tabs (Glyburide-metformin) .... 2 by mouth two times a day 3)  Amitriptyline Hcl 25 Mg Tabs (Amitriptyline hcl) .... 2 by mouth at bedtime 4)  Lipitor 20 Mg Tabs (Atorvastatin calcium) .Marland Kitchen.. 1 by mouth once daily 5)  Lisinopril 40 Mg Tabs (Lisinopril) .... One tablet by mouth daily for blood pressure 6)  Lasix 40 Mg Tabs (Furosemide) .... Take 1 1/2  tablet by mouth in the morning and 1 tablet by mouth in the afternoon (pharmacy note dose increase) 7)  Norvasc 10 Mg Tabs (Amlodipine  besylate) .... Once daily 8)  Potassium Chloride Crys Cr 20 Meq Cr-tabs (Potassium chloride crys cr) .... One tablet by mouth daily 9)  Knee High Ted Hose  .... Place on legs in am and wear daily 10)  Neurontin 300 Mg Caps (Gabapentin) .... Take 1 tablet by mouth three times a day 11)  Terazosin Hcl 2 Mg Caps (Terazosin hcl) .... Take 3  tabs by mouth at bedtime 12)  Calcium Carbonate-vitamin D 600-400 Mg-unit Tabs (Calcium carbonate-vitamin d) .... Take 1 tablet by mouth two times a day 13)  Oxycodone-acetaminophen 5-500 Mg Caps (Oxycodone-acetaminophen) 14)  Percocet 7.5-325 Mg Tabs (Oxycodone-acetaminophen) .Marland Kitchen.. 1 by mouth every 4 hours as needed for pain  Hypertension Assessment/Plan:      The patient's hypertensive risk group is category C: Target organ damage and/or diabetes.  Her calculated 10 year risk of coronary heart disease is 20 %.  Today's blood pressure is 170/73.  Her blood pressure goal is < 130/80.  Patient Instructions: 1)  Schedule retasure at [facility]. 2)  Increase Lasix to 40 mg 1 1/2 tablet in the morning and 1 tablet in the afternoon. 3)  Schedule lab visit in 2 weeks (come fasting . . . nothing to eat or drink after midnight except water): 4)  CMET, Lipids, CBC (Dx:  401.1, 272.4, V58.61). 5)  Please complete stool cards and return to the clinic. 6)  Please schedule a follow-up appointment in 3 months with Briton Sellman for diabetes and blood pressure.  Prescriptions: LASIX 40 MG  TABS (FUROSEMIDE) Take 1 1/2  tablet by mouth in the morning and 1 tablet by mouth in the afternoon (pharmacy note dose increase)  #90 x 5   Entered and Authorized by:   Tereso Newcomer PA-C   Signed by:   Tereso Newcomer PA-C on 12/07/2009   Method used:  Print then Give to Patient   RxID:   [id number]    Last LDL:                                                 56 (06/29/2008 10:02:00 PM)        Diabetic Foot Exam    10-g (5.07) Semmes-Weinstein Monofilament Test Performed  by: Chauncy Passy, SMA          Right Foot          Left Foot Visual Inspection               Test Control      normal         normal Site 1         normal         normal Site 2         normal         normal Site 3         normal         normal Site 4         normal         normal Site 5         normal         normal Site 6         normal         normal Site 7         normal         abnormal Site 8         normal         normal Site 9         abnormal         normal Site 10         normal         normal  Impression      normal         normal  Laboratory Results   Blood Tests     HGBA1C: 7.1%   (Normal Range: Non-Diabetic - 3-6%   Control Diabetic - 6-8%) CBG Random:: 95mg /dL

## 2010-09-25 NOTE — Assessment & Plan Note (Signed)
Summary: weak ankles and labwork///cns   Vital Signs:  Patient profile:   75 year old female Weight:      256.7 pounds Temp:     97.7 degrees F oral Pulse rate:   79 / minute Pulse rhythm:   regular Resp:     18 per minute BP sitting:   151 / 78  (left arm) Cuff size:   large  Vitals Entered By: Geanie Cooley  (September 01, 2009 12:37 PM) CC: pt states she has a hernia on the right side of her abdomen, arthritis from her ankles to her knees, wrist and back. Pt states she also has a problem with her right ear, she has a lot of wax in it. pt states she is tired all the time and has no energy.  She states her knee goes out while she is sitting down., Hypertension Management, Lipid Management Is Patient Diabetic? Yes Did you bring your meter with you today? No Pain Assessment Patient in pain? no      CBG Result 199  Does patient need assistance? Functional Status Self care Ambulation Normal   Primary Care Provider:  Tereso Newcomer PA-C  CC:  pt states she has a hernia on the right side of her abdomen, arthritis from her ankles to her knees, wrist and back. Pt states she also has a problem with her right ear, she has a lot of wax in it. pt states she is tired all the time and has no energy.  She states her knee goes out while she is sitting down., Hypertension Management, and Lipid Management.  History of Present Illness: Here for f/u.    Periumbilical hernia:  Present x 10 years.  Wants it fixed.  No vomiting.  Is passing flatus.  Having BMs like normal.  No significant pain, but bothering her and wants to talk to surgeon to have fixed.    Knee pain:  Notes cracking in knee with movement.  Notes pain esp. at night when she lies down.  Pain gets better with movement.  No change with change in weather.  DM:  Has eye dr.  Stann Mainland set up appt.  Does not check sugars reg.  Says most below 200.    HTN:  Taking all meds.  No problems.  H/o recurrent DVT:  Taking coumadin.  No bleeding  problems.   Hypertension History:      She complains of peripheral edema, but denies chest pain, dyspnea with exertion, and syncope.        Positive major cardiovascular risk factors include female age 35 years old or older, diabetes, hyperlipidemia, and hypertension.  Negative major cardiovascular risk factors include non-tobacco-user status.    Lipid Management History:      Positive NCEP/ATP III risk factors include female age 71 years old or older, diabetes, and hypertension.  Negative NCEP/ATP III risk factors include non-tobacco-user status.    Problems Prior to Update: 1)  Knee Pain  (ICD-719.46) 2)  Vitamin D Deficiency  (ICD-268.9) 3)  Periumbilical Hernia  (ICD-553.1) 4)  Preventive Health Care  (ICD-V70.0) 5)  Heart Murmur, Systolic  (ICD-785.2) 6)  Viral Infection  (ICD-079.99) 7)  Leg Pain, Bilateral  (ICD-729.5) 8)  Unspecified Venous Insufficiency  (ICD-459.81) 9)  Coumadin Therapy  (ICD-V58.61) 10)  Herniorrhaphy, Hx of  (ICD-V45.89) 11)  Gastric Polyp, Hx of  (ICD-V12.79) 12)  Gi Bleeding  (ICD-578.9) 13)  Edema  (ICD-782.3) 14)  Dyslipidemia  (ICD-272.4) 15)  Diabetes Mellitus, Type II,  With Neurological Complications  (ICD-250.60) 16)  Retinopathy, Diabetic, Background  (ICD-362.01) 17)  Hypertension  (ICD-401.9) 18)  Dvt, Hx of  (ICD-V12.51) 19)  Diabetes Mellitus, Type II  (ICD-250.00)  Allergies (verified): No Known Drug Allergies  Past History:  Past Medical History: Last updated: 04/10/2007 Current Problems:  GASTRIC POLYP, HX OF (ICD-V12.79) GI BLEEDING (ICD-578.9) EDEMA (ICD-782.3) DYSLIPIDEMIA (ICD-272.4) DIABETES MELLITUS, TYPE II, WITH NEUROLOGICAL COMPLICATIONS (ICD-250.60) RETINOPATHY, DIABETIC, BACKGROUND (ICD-362.01) HYPERTENSION (ICD-401.9) DVT, HX OF (ICD-V12.51) DIABETES MELLITUS, TYPE II (ICD-250.00)  Past Surgical History: Last updated: 05/19/2009 Cholecystectomy Tubal ligation 11/07 - EGD: [facility]:  Gastric  Polyps 10/10/06: EGD: [facility]  Inguinal herniorrhaphyx2 s/p TAH/BSO at age 51  Physical Exam  General:  alert, well-developed, and well-nourished.   Head:  normocephalic and atraumatic.   Ears:  L ear normal and R cerumen impaction.   Lungs:  normal breath sounds, no crackles, and no wheezes.   Heart:  normal rate and regular rhythm.   Abdomen:  soft, non-tender, and umbilical hernia.   Msk:  tend along medial joint line no eff no creptus bilat  Extremities:  2+ left pedal edema and 2+ right pedal edema.   Neurologic:  alert & oriented X3 and cranial nerves II-XII intact.   Psych:  normally interactive.     Impression & Recommendations:  Problem # 1:  PERIUMBILICAL HERNIA (ICD-553.1)  refer to surgeon to talk about repair  Orders: Surgical Referral (Surgery)  Problem # 2:  COUMADIN THERAPY (ICD-V58.61) no bleeding problems  Orders: T-Protime, Auto ([id number]) T-CBC w/Diff (29562-13086)  Problem # 3:  VITAMIN D DEFICIENCY (ICD-268.9)  recheck labs may be able to stop vitamin d (esp since she started new OTC supplement)  Orders: T- * Misc. Laboratory test [phone])  Problem # 4:  DIABETES MELLITUS, TYPE II (ICD-250.00) check A1C may need to talk about adding inuslin if over 7  Her updated medication list for this problem includes:    Glyburide-metformin 5-500 Mg Tabs (Glyburide-metformin) .Marland Kitchen... 2 by mouth two times a day    Lisinopril 40 Mg Tabs (Lisinopril) ..... One tablet by mouth daily for blood pressure  Orders: Capillary Blood Glucose/CBG (96295) Capillary Blood Glucose/CBG (28413) T- Hemoglobin A1C (24401-02725) T-Comprehensive Metabolic Panel (36644-03474)  Problem # 5:  DYSLIPIDEMIA (ICD-272.4) not fasting check labs at f/u  Her updated medication list for this problem includes:    Lipitor 20 Mg Tabs (Atorvastatin calcium) .Marland Kitchen... 1 by mouth once daily  Problem # 6:  HYPERTENSION (ICD-401.9) repeat BP optimal no changes  Her  updated medication list for this problem includes:    Lisinopril 40 Mg Tabs (Lisinopril) ..... One tablet by mouth daily for blood pressure    Lasix 40 Mg Tabs (Furosemide) .Marland Kitchen... Take 1 tablet by mouth two times a day    Norvasc 10 Mg Tabs (Amlodipine besylate) ..... Once daily    Terazosin Hcl 2 Mg Caps (Terazosin hcl) .Marland Kitchen... Take 3  tabs by mouth at bedtime  Problem # 7:  EDEMA (ICD-782.3) stable cont furosemide  Her updated medication list for this problem includes:    Lasix 40 Mg Tabs (Furosemide) .Marland Kitchen... Take 1 tablet by mouth two times a day  Problem # 8:  RETINOPATHY, DIABETIC, BACKGROUND (ICD-362.01) has eye doctor and will make appt  Problem # 9:  KNEE PAIN (ICD-719.46)  suspect DJD rec. tylenol check xrays  Orders: Diagnostic X-Ray/Fluoroscopy (Diagnostic X-Ray/Flu)  Problem # 10:  Preventive Health Care (ICD-V70.0)  flu shot today schedule mammo schedule  CPE  Orders: Mammogram (Screening) (Mammo) T-CBC w/Diff (16109-60454) T-TSH [phone])  Problem # 11:  URINALYSIS, ABNORMAL (ICD-791.9)  Orders: T- * Misc. Laboratory test [phone]) T-Culture, Urine ([id number])  Complete Medication List: 1)  Warfarin Sodium 5 Mg Tabs (Warfarin sodium) .... 2 by mouth every day at 4 p.m. 2)  Glyburide-metformin 5-500 Mg Tabs (Glyburide-metformin) .... 2 by mouth two times a day 3)  Amitriptyline Hcl 25 Mg Tabs (Amitriptyline hcl) .... 2 by mouth at bedtime 4)  Lipitor 20 Mg Tabs (Atorvastatin calcium) .Marland Kitchen.. 1 by mouth once daily 5)  Lisinopril 40 Mg Tabs (Lisinopril) .... One tablet by mouth daily for blood pressure 6)  Lasix 40 Mg Tabs (Furosemide) .... Take 1 tablet by mouth two times a day 7)  Norvasc 10 Mg Tabs (Amlodipine besylate) .... Once daily 8)  Potassium Chloride Crys Cr 20 Meq Cr-tabs (Potassium chloride crys cr) .... One tablet by mouth daily 9)  Knee High Ted Hose  .... Place on legs in am and wear daily 10)  Neurontin 300 Mg Caps (Gabapentin) .... Take 1  tablet by mouth three times a day 11)  Terazosin Hcl 2 Mg Caps (Terazosin hcl) .... Take 3  tabs by mouth at bedtime 12)  Vitamin D3 1000 Unit Tabs (Cholecalciferol) .... Take 1 tablet by mouth once a day 13)  Calcium + Vitamin D (600mg /400iu)  .... 2 by mouth once daily  Hypertension Assessment/Plan:      The patient's hypertensive risk group is category C: Target organ damage and/or diabetes.  Her calculated 10 year risk of coronary heart disease is 17 %.  Today's blood pressure is 151/78.  Her blood pressure goal is < 130/80.  Lipid Assessment/Plan:      Based on NCEP/ATP III, the patient's risk factor category is "history of diabetes".  The patient's lipid goals are as follows: Total cholesterol goal is 200; LDL cholesterol goal is 100; HDL cholesterol goal is 40; Triglyceride goal is 150.      Patient Instructions: 1)  Flu shot today. 2)  Come in fasting to next PT/INR check Lipids (nothing to eat or drink after midnight except water).  Dx 272.4 3)  Please schedule a follow-up appointment in 3 months with Tereasa Yilmaz for CPE. 4)    5)  Take 650 - 1000 mg of tylenol every 4-6 hours as needed for relief of pain or comfort of fever. Avoid taking more than 4000 mg in a 24 hour period( can cause liver damage in higher doses).    Vital Signs:  Patient profile:   75 year old female Weight:      256.7 pounds Temp:     97.7 degrees F oral Pulse rate:   79 / minute Pulse rhythm:   regular Resp:     18 per minute BP sitting:   151 / 78  (left arm) Cuff size:   large  Vitals Entered By: Geanie Cooley  (September 01, 2009 12:37 PM)    Serial Vital Signs/Assessments:  Time      Position  BP       Pulse  Resp  Temp     By                     127/74                         Tereso Newcomer PA-C  Laboratory Results   Urine Tests  Date/Time Received: September 01, 2009 12:56 PM   Routine Urinalysis   Color: lt. yellow Glucose: negative   (Normal Range: Negative) Bilirubin: negative   (Normal  Range: Negative) Ketone: negative   (Normal Range: Negative) Spec. Gravity: 1.010   (Normal Range: 1.003-1.035) Blood: trace-intact   (Normal Range: Negative) pH: 5.0   (Normal Range: 5.0-8.0) Protein: negative   (Normal Range: Negative) Urobilinogen: 0.2   (Normal Range: 0-1) Nitrite: negative   (Normal Range: Negative) Leukocyte Esterace: negative   (Normal Range: Negative)     Blood Tests     CBG Random:: 199mg /dL     Appended Document: weak ankles and labwork///cns DG KNEE COMPLETE 4 VIEWS*L* - [id number]   Clinical Data: Bilateral anterior knee pain with no known trauma.   LEFT KNEE - COMPLETE 4+ VIEW   Comparison: Contralateral knee   Findings: There is an area of popcorn like chondroid calcification in the medial distal aspect of the medullary cavity of the left femoral metaphysis.  The appearance is compatible with an area of calcification associated with focal infarction or benign bone lesion such as an enchondroma.   There is some medial joint space narrowing with associated subchondral sclerosis and mild medial osteophytosis.  Spurring of the tibial spines and mild spurring of the patella are seen and these findings are compatible with mild degenerative changes.   No joint effusion is seen.  Some vascular calcification is noted in the popliteal and distal femoral artery.   IMPRESSION: Mild degenerative changes as described above.  No evidence for joint effusion.  Distal femoral bony lesion compatible with a benign enchondroma or calcification associated with an area of focal infarction   Read By:  Bertha Stakes,  M.D.     Released By:  Bertha Stakes,  M.D.  _____________________________________________________________________  External Attachment:    Type:     Image     Comment:  DG KNEE COMPLETE 4 VIEWS*L* - [id number]  Signed by Tereso Newcomer PA-C on 09/18/2009 at 8:43  AM  ________________________________________________________________________ discussed xray with Dr. Jeneen Montgomery at [facility] he favors benign enchondroma (benign cartilage lesion) she has arthritic changes bilat.    Signed by Tereso Newcomer PA-C on 09/18/2009 at 8:43 AM  ________________________________________________________________________ see phone note 1.24.2011   Signed by Tereso Newcomer PA-C on 09/18/2009 at 8:45 AM  Appended Document: weak ankles and labwork///cns DG KNEE COMPLETE 4 VIEWS*R* - [id number]   Clinical Data: The bilateral knee pain.  No history of trauma   RIGHT KNEE - COMPLETE 4+ VIEW   Comparison: Contralateral knee   Findings: Degenerative changes with moderate medial joint space narrowing with associated subchondral sclerosis and osteophytosis is seen.  Spurring of the tibial spines is also noted.  No evidence for joint effusion is seen.  No focal bony abnormalities are noted. Soft tissue planes appear maintained.  Vascular calcification is identified in the descending femoral artery.   IMPRESSION: Degenerative changes as described above.   Read By:  Bertha Stakes,  M.D.     Released By:  Bertha Stakes,  M.D.  _____________________________________________________________________  External Attachment:    Type:     Image     Comment:  DG KNEE COMPLETE 4 VIEWS*R* - [id number]  Signed by Tereso Newcomer PA-C on 09/18/2009 at 8:43 AM  ________________________________________________________________________

## 2010-09-25 NOTE — Letter (Signed)
Summary: *HSN Results Follow up  [facility]  [address] [place], [place] [address]   Phone: [phone]  Fax: [phone]      04/05/2010   Vickie Pham [address] [place], [place]  [address]   Dear  [name],                            ____S.Drinkard,FNP   ____D. Gore,FNP       ____B. McPherson,MD   ____V. Rankins,MD    ____E. Mulberry,MD    ____N. Daphine Deutscher, FNP  ____D. Reche Dixon, MD    ____K. Philipp Deputy, MD    __x__S. Alben Spittle, PA-C    This letter is to inform you that your recent test(s):  _______Pap Smear    ___x____Lab Test     _______X-ray    ___x____ is within acceptable limits  _______ requires a medication change  _______ requires a follow-up lab visit  _______ requires a follow-up visit with your provider   Comments:  Liver, kidney function and protein levels ok.  Test for heart failure negative.  Continue taking your same dose of Lasix (furosemide).       _________________________________________________________ If you have any questions, please contact our office                     Sincerely,  Tereso Newcomer PA-C [facility]

## 2010-09-27 NOTE — Progress Notes (Signed)
Summary: meds refill  Phone Note Refill Request Call back at [phone]   Refills Requested: Medication #1:  GLYBURIDE-METFORMIN 5-500 MG  TABS 2 by mouth two times a day  Medication #2:  NORVASC 10 MG  TABS once daily and the water pill , [facility] [address]   Initial call taken by: Domenic Polite,  August 31, 2010 3:02 PM  Follow-up for Phone Call        Refills already done. Pt. notified. Gaylyn Cheers RN  August 31, 2010 3:22 PM

## 2010-09-28 ENCOUNTER — Ambulatory Visit (HOSPITAL_COMMUNITY): Payer: Self-pay

## 2010-09-28 NOTE — Letter (Signed)
Summary: GEN.SURGERY NOTE //[facility]  GEN.SURGERY NOTE //[facility]   Imported By: Arta Bruce 10/23/2009 10:27:25  _____________________________________________________________________  External Attachment:    Type:   Image     Comment:   External Document

## 2010-10-01 ENCOUNTER — Encounter (INDEPENDENT_AMBULATORY_CARE_PROVIDER_SITE_OTHER): Payer: Self-pay | Admitting: Nurse Practitioner

## 2010-10-01 ENCOUNTER — Encounter: Payer: Self-pay | Admitting: Nurse Practitioner

## 2010-10-01 LAB — CONVERTED CEMR LAB: Hgb A1c MFr Bld: 7.3 %

## 2010-10-02 LAB — CONVERTED CEMR LAB
INR: 2.01 — ABNORMAL HIGH (ref <1.50)
Prothrombin Time: 22.9 s — ABNORMAL HIGH (ref 11.6–15.2)

## 2010-10-11 ENCOUNTER — Ambulatory Visit (HOSPITAL_COMMUNITY): Admission: RE | Admit: 2010-10-11 | Payer: Medicare Other | Source: Ambulatory Visit

## 2010-10-11 NOTE — Assessment & Plan Note (Signed)
Summary: Diabetes   Vital Signs:  Patient profile:   75 year old female Weight:      256.6 pounds BMI:     39.16 Temp:     97.1 degrees F oral Pulse rate:   72 / minute Pulse rhythm:   regular Resp:     16 per minute BP sitting:   150 / 80  (left arm) Cuff size:   regular  Vitals Entered By: Levon Hedger (October 01, 2010 10:37 AM)  Nutrition Counseling: Patient's BMI is greater than 25 and therefore counseled on weight management options. CC: follow-up visit, Hypertension Management, Lipid Management Is Patient Diabetic? Yes Pain Assessment Patient in pain? yes     Location: feet Onset of pain  Chronic CBG Result 132 CBG Device ID A  Does patient need assistance? Ambulation Normal   Primary Care Provider:  Tereso Newcomer PA-C  CC:  follow-up visit, Hypertension Management, and Lipid Management.  History of Present Illness:  Pt into office for f/u. She has been going to [facility] [place] for lymphedema.  Ongoing f/u and she was wearing una boots. She was wrapping every 3 days but the effort was great for the pt.  She was not able to reach the toes.  She also did not feel like the therapy was doing much good.   She has also put some phonebooks under her mattress to keep the legs elevated.      Anticoagulation Management History:      The patient is taking warfarin and comes in today for a routine follow up visit.  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  Positive risk factors for bleeding include an age of 26 years or older, history of GI bleeding, and presence of serious comorbidities.  Negative risk factors for bleeding include no history of CVA/TIA.  The bleeding index is 'high risk'.  Positive CHADS2 values include History of HTN and History of Diabetes.  Negative CHADS2 values include Age > 67 years old and Prior Stroke/CVA/TIA.  Plans are to  continue warfarin for life.  Her last INR was 3.37.    Diabetes Management History:      The patient is a 75 years old female who comes in for evaluation of DM Type 2.  She has not been enrolled in the "Diabetic Education Program".  She states understanding of dietary principles and is following her diet appropriately.  No sensory loss is reported.  Self foot exams are not being performed.  She is checking home blood sugars.  She says that she is not exercising regularly.        Hypoglycemic symptoms are not occurring.  No hyperglycemic symptoms are reported.  Other comments include: checks her blood sugar daily before breakfast.    Hypertension History:      She denies headache, chest pain, and palpitations.  She notes no problems with any antihypertensive medication side effects.  pt has not taken her meds yet today.        Positive major cardiovascular risk factors include female age 68 years old or older, diabetes, hyperlipidemia, and hypertension.  Negative major cardiovascular risk factors include non-tobacco-user status.        Further assessment for target organ damage reveals no history of ASHD or stroke/TIA.    Lipid Management History:      Positive NCEP/ATP III risk factors include female age 9 years old or older,  diabetes, and hypertension.  Negative NCEP/ATP III risk factors include non-tobacco-user status, no ASHD (atherosclerotic heart disease), and no prior stroke/TIA.        The patient states that she does not know about the "Therapeutic Lifestyle Change" diet.  The patient does not know about adjunctive measures for cholesterol lowering.  She expresses no side effects from her lipid-lowering medication.  Comments include: Pt is taking lipitor daily as ordered.  The patient denies any symptoms to suggest myopathy or liver disease.       Habits & Providers  Exercise-Depression-Behavior     Does Patient Exercise: no  Medications Prior to Update: 1)  Warfarin Sodium 5 Mg   Tabs (Warfarin Sodium) .... 2 By Mouth Every Day At 4 P.m. 2)  Glyburide-Metformin 5-500 Mg  Tabs (Glyburide-Metformin) .... 2 By Mouth Two Times A Day 3)  Amitriptyline Hcl 25 Mg  Tabs (Amitriptyline Hcl) .... 2 By Mouth At Bedtime 4)  Lipitor 20 Mg  Tabs (Atorvastatin Calcium) .Marland Kitchen.. 1 By Mouth Once Daily 5)  Lisinopril 40 Mg  Tabs (Lisinopril) .... One Tablet By Mouth Daily For Blood Pressure 6)  Lasix 40 Mg  Tabs (Furosemide) .... Take 2 By Mouth Two Times A Day 7)  Norvasc 10 Mg  Tabs (Amlodipine Besylate) .... Once Daily 8)  Potassium Chloride Crys Cr 20 Meq Cr-Tabs (Potassium Chloride Crys Cr) .... One Tablet By Mouth Daily 9)  Neurontin 300 Mg Caps (Gabapentin) .... Take 1 Tablet By Mouth Three Times A Day 10)  Terazosin Hcl 2 Mg Caps (Terazosin Hcl) .... Take 3  Tabs By Mouth At Bedtime 11)  Calcium Carbonate-Vitamin D 600-400 Mg-Unit  Tabs (Calcium Carbonate-Vitamin D) .... Take 1 Tablet By Mouth Two Times A Day 12)  Oxycodone-Acetaminophen 5-500 Mg Caps (Oxycodone-Acetaminophen) 13)  Percocet 7.5-325 Mg Tabs (Oxycodone-Acetaminophen) .Marland Kitchen.. 1 By Mouth Every 4 Hours As Needed For Pain 14)  Ferrous Sulfate 325 (65 Fe) Mg Tabs (Ferrous Sulfate) .... Take 1 Tablet By Mouth Three Times A Day 15)  Compression Stockings .... Bilateral Thigh High; 20-30 Mmhg  Allergies (verified): No Known Drug Allergies  Social History: Does Patient Exercise:  no  Review of Systems General:  Denies fever. CV:  Denies chest pain or discomfort. Resp:  Denies cough. GI:  Denies abdominal pain, nausea, and vomiting.  Physical Exam  General:  alert.  obese Head:  normocephalic.   Eyes:  glasses Lungs:  normal breath sounds.   Heart:  normal rate and regular rhythm.   Extremities:  1+ left pedal edema and 1+ right pedal edema.   Neurologic:  cane usealert & oriented X3.   Skin:  dry Psych:  Oriented X3.    Diabetes Management Exam:    Foot Exam (with socks and/or shoes not present):        Sensory-Monofilament:          Left foot: normal          Right foot: normal   Impression & Recommendations:  Problem # 1:  DVT, HX OF (ICD-V12.51) will check labs today Her updated medication list for this problem includes:    Warfarin Sodium 5 Mg Tabs (Warfarin sodium) .Marland Kitchen... 2 by mouth every day at 4 p.m.  Orders: T-Protime, Auto ([id number])  Problem # 2:  DIABETES MELLITUS, TYPE II (ICD-250.00) continue current meds Hgba1c = 7.3 today Her updated medication list for this problem includes:    Glyburide-metformin 5-500 Mg Tabs (Glyburide-metformin) .Marland Kitchen... 2 by mouth two times a day  Lisinopril 40 Mg Tabs (Lisinopril) ..... One tablet by mouth daily  Problem # 3:  HYPERTENSION (ICD-401.9) BP elevated today pt has not taken meds yet today - will monitor Her updated medication list for this problem includes:    Lisinopril 40 Mg Tabs (Lisinopril) ..... One tablet by mouth daily    Lasix 40 Mg Tabs (Furosemide) ..... One tablet by mouth two times a day    Norvasc 10 Mg Tabs (Amlodipine besylate) ..... Once daily    Terazosin Hcl 2 Mg Caps (Terazosin hcl) .Marland Kitchen... Take 3  tabs by mouth at bedtime  Problem # 4:  DYSLIPIDEMIA (ICD-272.4) will check labs in May 2012 Her updated medication list for this problem includes:    Lipitor 20 Mg Tabs (Atorvastatin calcium) ..... One tablet by mouth nightly for cholesterol  Complete Medication List: 1)  Warfarin Sodium 5 Mg Tabs (Warfarin sodium) .... 2 by mouth every day at 4 p.m. 2)  Glyburide-metformin 5-500 Mg Tabs (Glyburide-metformin) .... 2 by mouth two times a day 3)  Lipitor 20 Mg Tabs (Atorvastatin calcium) .... One tablet by mouth nightly for cholesterol 4)  Lisinopril 40 Mg Tabs (Lisinopril) .... One tablet by mouth daily 5)  Lasix 40 Mg Tabs (Furosemide) .... One tablet by mouth two times a day 6)  Norvasc 10 Mg Tabs (Amlodipine besylate) .... Once daily 7)  Potassium Chloride Crys Cr 20 Meq Cr-tabs (Potassium chloride crys cr)  .... One tablet by mouth daily 8)  Terazosin Hcl 2 Mg Caps (Terazosin hcl) .... Take 3  tabs by mouth at bedtime 9)  Calcium Carbonate-vitamin D 600-400 Mg-unit Tabs (Calcium carbonate-vitamin d) .... Take 1 tablet by mouth two times a day 10)  Ferrous Sulfate 325 (65 Fe) Mg Tabs (Ferrous sulfate) .... Take 1 tablet by mouth three times a day 11)  Compression Stockings  .... Bilateral thigh high; 20-30 mmhg  Other Orders: Capillary Blood Glucose/CBG (82948) Hemoglobin A1C (27253)  Anticoagulation Management Assessment/Plan:      The patient's current anticoagulation dose is Warfarin sodium 5 mg  tabs: 2 by mouth every day at 4 p.m..  The target INR is 2.0-3.0.  The next INR is due 06/20/2010.         Diabetes Management Assessment/Plan:      The following lipid goals have been established for the patient: Total cholesterol goal of 200; LDL cholesterol goal of 100; HDL cholesterol goal of 40; Triglyceride goal of 150.  Her blood pressure goal is < 130/80.    Hypertension Assessment/Plan:      The patient's hypertensive risk group is category C: Target organ damage and/or diabetes.  Her calculated 10 year risk of coronary heart disease is 17 %.  Today's blood pressure is 150/80.  Her blood pressure goal is < 130/80.  Lipid Assessment/Plan:      Based on NCEP/ATP III, the patient's risk factor category is "history of diabetes".  The patient's lipid goals are as follows: Total cholesterol goal is 200; LDL cholesterol goal is 100; HDL cholesterol goal is 40; Triglyceride goal is 150.    Patient Instructions: 1)  Cholesterol - will check in May 2012 2)  Will need to be fasting (no food after midnight before this visit) 3)  Hx of blood clots - Will check PT/INR today and this should be checked monthly 4)  Follow up monthly for PT/INR. 5)  Follow up with provider in 3 months for diabetes. Come fasting for labs. Will need u/a, microalbumin. Prescriptions: LIPITOR 20  MG  TABS (ATORVASTATIN  CALCIUM) One tablet by mouth nightly for cholesterol  #30 x 11   Entered and Authorized by:   Lehman Prom FNP   Signed by:   Lehman Prom FNP on 10/01/2010   Method used:   Print then Give to Patient   RxID:   [id number] LIPITOR 20 MG  TABS (ATORVASTATIN CALCIUM) 1 by mouth once daily  #30 Each x 11   Entered and Authorized by:   Lehman Prom FNP   Signed by:   Lehman Prom FNP on 10/01/2010   Method used:   Print then Give to Patient   RxID:   [id number]    Orders Added: 1)  Est. Patient Level IV [84696] 2)  Capillary Blood Glucose/CBG [82948] 3)  Hemoglobin A1C [83036] 4)  T-Protime, Auto [[id number]]     Last LDL:                                                 57 (12/21/2009 9:27:00 PM)        Diabetic Foot Exam Foot Inspection Are the toenails long?                Yes Are the toenails thick?                Yes         10-g (5.07) Semmes-Weinstein Monofilament Test Performed by: Levon Hedger          Right Foot          Left Foot Visual Inspection               Test Control      normal         normal Site 1         normal         normal Site 2         normal         normal Site 3         normal         normal Site 4         normal         normal Site 5         normal         normal Site 6         normal         normal Site 7         normal         normal Site 8         normal         normal Site 9         normal         normal Site 10         normal         normal  Impression      normal         normal  Prevention & Chronic Care Immunizations   Influenza vaccine: historical per pt  (05/28/2010)    Tetanus booster: 10/21/2004: Historical    Pneumococcal vaccine: Historical  (10/21/2005)    H. zoster vaccine: Not documented  Colorectal Screening   Hemoccult: NEG x 2  (02/12/2010)    Colonoscopy: Results: Diverticulosis.  Location:  [facility].     (10/24/2006)   Colonoscopy action/deferral: Repeat  colonoscopy in 10 years.    (10/24/2006)  Other Screening   Pap smear: Not documented    Mammogram: ASSESSMENT: Negative - BI-RADS 1^MM DIGITAL SCREENING  (09/07/2009)    DXA bone density scan: Lumbar Spine:  T Score-2.5 to -1.0 Spine.   Hip Total: T Score -2.5 to -1.0 Hip.    Done at [facility]  (03/21/2009)   Smoking status: never  (04/10/2007)  Diabetes Mellitus   HgbA1C: 7.3  (10/01/2010)    Eye exam: Retasure:  Diabetic Retinopathy  (12/20/2009)    Foot exam: yes  (10/01/2010)   High risk foot: Not documented   Foot care education: Not documented    Urine microalbumin/creatinine ratio: 23.0  (12/07/2009)  Lipids   Total Cholesterol: 140  (12/21/2009)   LDL: 57  (12/21/2009)   LDL Direct: Not documented   HDL: 58  (12/21/2009)   Triglycerides: 123  (12/21/2009)    SGOT (AST): 15  (04/04/2010)   SGPT (ALT): 15  (04/04/2010)   Alkaline phosphatase: 73  (04/04/2010)   Total bilirubin: 0.4  (04/04/2010)  Hypertension   Last Blood Pressure: 150 / 80  (10/01/2010)   Serum creatinine: 0.99  (04/04/2010)   Serum potassium 4.5  (04/04/2010)  Self-Management Support :    Diabetes self-management support: Not documented    Hypertension self-management support: Not documented    Lipid self-management support: Not documented    Laboratory Results   Blood Tests   Date/Time Received: October 01, 2010 11:46 AM   HGBA1C: 7.3%   (Normal Range: Non-Diabetic - 3-6%   Control Diabetic - 6-8%) CBG Random:: 132mg /dL

## 2010-10-18 ENCOUNTER — Encounter (INDEPENDENT_AMBULATORY_CARE_PROVIDER_SITE_OTHER): Payer: Self-pay | Admitting: Nurse Practitioner

## 2010-10-23 NOTE — Medication Information (Signed)
Summary: [employer] [facility]//FAXED  [employer] [facility]//FAXED   Imported By: Arta Bruce 10/18/2010 09:12:45  _____________________________________________________________________  External Attachment:    Type:   Image     Comment:   External Document

## 2010-10-29 ENCOUNTER — Encounter (INDEPENDENT_AMBULATORY_CARE_PROVIDER_SITE_OTHER): Payer: Self-pay | Admitting: Nurse Practitioner

## 2010-10-30 ENCOUNTER — Encounter: Payer: Self-pay | Admitting: Nurse Practitioner

## 2010-10-30 ENCOUNTER — Encounter (INDEPENDENT_AMBULATORY_CARE_PROVIDER_SITE_OTHER): Payer: Self-pay | Admitting: Nurse Practitioner

## 2010-11-01 ENCOUNTER — Encounter (INDEPENDENT_AMBULATORY_CARE_PROVIDER_SITE_OTHER): Payer: Self-pay | Admitting: Nurse Practitioner

## 2010-11-05 ENCOUNTER — Encounter: Payer: Self-pay | Admitting: Nurse Practitioner

## 2010-11-05 ENCOUNTER — Encounter (INDEPENDENT_AMBULATORY_CARE_PROVIDER_SITE_OTHER): Payer: Self-pay | Admitting: Nurse Practitioner

## 2010-11-06 NOTE — Letter (Signed)
Summary: ANTICOAGULATION DOSING  ANTICOAGULATION DOSING   Imported By: Arta Bruce 10/31/2010 10:54:08  _____________________________________________________________________  External Attachment:    Type:   Image     Comment:   External Document

## 2010-11-06 NOTE — Miscellaneous (Signed)
Summary: [facility]   Anticoagulation Management History:      The patient is taking warfarin and comes in today for a routine follow up visit.  The patient is taking warfarin for deep venous thrombosis and/or pulmonary embolism which has been recurrent and is associated with continued risk factors.  Positive risk factors for bleeding include an age of 73 years or older, history of GI bleeding, and presence of serious comorbidities.  Negative risk factors for bleeding include no history of CVA/TIA.  The bleeding index is 'high risk'.  Positive CHADS2 values include History of HTN and History of Diabetes.  Negative CHADS2 values include Age > 73 years old and Prior Stroke/CVA/TIA.  Plans are to continue warfarin for life.  Her last INR was 2.01 and today's INR is 4.2.  Prothrombin time is 54.8.    Anticoagulation Management Assessment/Plan:      The patient's current anticoagulation dose is Warfarin sodium 5 mg  tabs: 2 by mouth every day at 4 p.m..  The target INR is 2.0-3.0.  The next INR is due 11/06/2010.         Prior Anticoagulation Instructions: The patient is to continue with the same dose of coumadin.  This dosage includes:   Current Anticoagulation Instructions: Stop for 1 day then resumts dose Warfarin sodium 5 mg  tabs: 2 by mouth every day at 4 p.m.Marland Kitchen   Clinical Lists Changes  Observations: Added new observation of NEXT PT: 11/06/2010 (10/30/2010 15:07) Added new observation of INSTRUCT C: Stop for 1 day then resumts dose Warfarin sodium 5 mg  tabs: 2 by mouth every day at 4 p.m..   (10/30/2010 15:07) Added new observation of INR: 4.2  (10/30/2010 15:07) Added new observation of PT PATIENT: 54.8 s (10/30/2010 15:07) Added new observation of COUM DOSE: 16^10^96^04^54^09^81  (10/30/2010 15:07) Added new observation of BLEED RISK1: high risk  (10/30/2010 15:07) Added new observation of COUMADIN CHG: 0  (10/30/2010 15:07) Added new observation of COUM TOT WK: 70 mg  (10/30/2010  15:07)

## 2010-11-06 NOTE — Letter (Signed)
Summary: MEDICAID TRANSPORTATION  VERIFICATION  MEDICAID TRANSPORTATION  VERIFICATION   Imported By: Arta Bruce 10/29/2010 11:53:37  _____________________________________________________________________  External Attachment:    Type:   Image     Comment:   External Document

## 2010-11-06 NOTE — Letter (Signed)
Summary: [facility] [employer]//SIGNED AND REFAXED  [facility] [employer]//SIGNED AND REFAXED   Imported By: Arta Bruce 11/01/2010 10:26:06  _____________________________________________________________________  External Attachment:    Type:   Image     Comment:   External Document

## 2010-11-13 NOTE — Letter (Signed)
Summary: ANTICOAGULATION DOSING  ANTICOAGULATION DOSING   Imported By: Arta Bruce 11/09/2010 12:03:50  _____________________________________________________________________  External Attachment:    Type:   Image     Comment:   External Document

## 2010-11-13 NOTE — Letter (Signed)
Summary: MEDICAID TRANSPORTATION//FAXED  MEDICAID TRANSPORTATION//FAXED   Imported By: Arta Bruce 11/05/2010 10:17:27  _____________________________________________________________________  External Attachment:    Type:   Image     Comment:   External Document

## 2010-11-13 NOTE — Miscellaneous (Signed)
Summary: Coumadin Clinic   Anticoagulation Management History:      The patient is taking warfarin and comes in today for a routine follow up visit.  The patient is taking warfarin for deep venous thrombosis and/or pulmonary embolism which has been recurrent and is associated with continued risk factors.  Positive risk factors for bleeding include an age of 73 years or older, history of GI bleeding, and presence of serious comorbidities.  Negative risk factors for bleeding include no history of CVA/TIA.  The bleeding index is 'high risk'.  Positive CHADS2 values include History of HTN and History of Diabetes.  Negative CHADS2 values include Age > 80 years old and Prior Stroke/CVA/TIA.  Plans are to continue warfarin for life.  Her last INR was 4.2 and today's INR is 3.2.  Prothrombin time is 41.8.    Anticoagulation Management Assessment/Plan:      The patient's current anticoagulation dose is Warfarin sodium 5 mg  tabs: 2 by mouth every day at 4 p.m..  The target INR is 2.0-3.0.  The next INR is due 11/19/2010.         Current Anticoagulation Instructions: The patient is to continue with the same dose of coumadin.  This dosage includes:  Warfarin sodium 5 mg  tabs: 2 by mouth every day at 4 p.m.Marland Kitchen   Clinical Lists Changes  Observations: Added new observation of NEXT PT: 11/19/2010 (11/05/2010 14:18) Added new observation of INSTRUCT C: The patient is to continue with the same dose of coumadin.  This dosage includes:  Warfarin sodium 5 mg  tabs: 2 by mouth every day at 4 p.m..   (11/05/2010 14:18) Added new observation of INR: 3.2  (11/05/2010 14:18) Added new observation of PT PATIENT: 41.8 s (11/05/2010 14:18) Added new observation of COUM DOSE: 95^63^87^56^43^32^95  (11/05/2010 14:18) Added new observation of BLEED RISK1: high risk  (11/05/2010 14:18) Added new observation of COUMADIN CHG: 0  (11/05/2010 14:18) Added new observation of COUM TOT WK: 70 mg  (11/05/2010 14:18)

## 2010-11-14 LAB — COMPREHENSIVE METABOLIC PANEL
AST: 20 U/L (ref 0–37)
BUN: 15 mg/dL (ref 6–23)
CO2: 26 mEq/L (ref 19–32)
Calcium: 9.2 mg/dL (ref 8.4–10.5)
Chloride: 103 mEq/L (ref 96–112)
Creatinine, Ser: 1.08 mg/dL (ref 0.4–1.2)
GFR calc Af Amer: >60 mL/min (ref >60)
GFR calc non Af Amer: 50 mL/min — ABNORMAL LOW (ref >60)
Total Bilirubin: 0.7 mg/dL (ref 0.3–1.2)

## 2010-11-14 LAB — DIFFERENTIAL
Basophils Absolute: 0 10*3/uL (ref 0.0–0.1)
Eosinophils Relative: 3 % (ref 0–5)
Lymphocytes Relative: 16 % (ref 12–46)
Lymphs Abs: 1.1 10*3/uL (ref 0.7–4.0)
Neutro Abs: 5.4 10*3/uL (ref 1.7–7.7)

## 2010-11-14 LAB — CBC
Hemoglobin: 11.8 g/dL — ABNORMAL LOW (ref 12.0–15.0)
MCHC: 33.4 g/dL (ref 30.0–36.0)
MCV: 88.5 fL (ref 78.0–100.0)
RBC: 3.98 MIL/uL (ref 3.87–5.11)

## 2010-11-14 LAB — APTT: aPTT: 35 seconds (ref 24–37)

## 2010-11-14 LAB — PROTIME-INR
INR: 2.49 — ABNORMAL HIGH (ref 0.00–1.49)
Prothrombin Time: 26.7 seconds — ABNORMAL HIGH (ref 11.6–15.2)

## 2010-11-18 LAB — GLUCOSE, CAPILLARY
Glucose-Capillary: 125 mg/dL — ABNORMAL HIGH (ref 70–99)
Glucose-Capillary: 134 mg/dL — ABNORMAL HIGH (ref 70–99)
Glucose-Capillary: 141 mg/dL — ABNORMAL HIGH (ref 70–99)
Glucose-Capillary: 155 mg/dL — ABNORMAL HIGH (ref 70–99)
Glucose-Capillary: 163 mg/dL — ABNORMAL HIGH (ref 70–99)
Glucose-Capillary: 195 mg/dL — ABNORMAL HIGH (ref 70–99)
Glucose-Capillary: 196 mg/dL — ABNORMAL HIGH (ref 70–99)
Glucose-Capillary: 207 mg/dL — ABNORMAL HIGH (ref 70–99)
Glucose-Capillary: 230 mg/dL — ABNORMAL HIGH (ref 70–99)
Glucose-Capillary: 323 mg/dL — ABNORMAL HIGH (ref 70–99)

## 2010-11-18 LAB — HEMOGLOBIN A1C
Hgb A1c MFr Bld: 7.9 % — ABNORMAL HIGH (ref 4.6–6.1)
Mean Plasma Glucose: 180 mg/dL

## 2010-11-18 LAB — COMPREHENSIVE METABOLIC PANEL
Albumin: 3.4 g/dL — ABNORMAL LOW (ref 3.5–5.2)
BUN: 15 mg/dL (ref 6–23)
Calcium: 8.7 mg/dL (ref 8.4–10.5)
Chloride: 101 mEq/L (ref 96–112)
Creatinine, Ser: 1.12 mg/dL (ref 0.4–1.2)
GFR calc Af Amer: 58 mL/min — ABNORMAL LOW (ref >60)
GFR calc non Af Amer: 48 mL/min — ABNORMAL LOW (ref >60)
Total Bilirubin: 0.9 mg/dL (ref 0.3–1.2)

## 2010-11-18 LAB — CBC
HCT: 33.7 % — ABNORMAL LOW (ref 36.0–46.0)
MCHC: 33.3 g/dL (ref 30.0–36.0)
MCV: 90.4 fL (ref 78.0–100.0)
Platelets: 236 10*3/uL (ref 150–400)
RDW: 15.8 % — ABNORMAL HIGH (ref 11.5–15.5)
WBC: 11.2 10*3/uL — ABNORMAL HIGH (ref 4.0–10.5)

## 2010-11-18 LAB — PROTIME-INR: Prothrombin Time: 13.6 seconds (ref 11.6–15.2)

## 2010-11-27 NOTE — Letter (Signed)
Summary: POLYPHARMACY PROGRAM-DRUG DISEASE INTERACTIONS  POLYPHARMACY PROGRAM-DRUG DISEASE INTERACTIONS   Imported By: Arta Bruce 11/22/2010 14:17:19  _____________________________________________________________________  External Attachment:    Type:   Image     Comment:   External Document

## 2010-12-03 ENCOUNTER — Other Ambulatory Visit: Payer: Self-pay | Admitting: Physician Assistant

## 2010-12-10 ENCOUNTER — Other Ambulatory Visit: Payer: Self-pay | Admitting: Physician Assistant

## 2010-12-10 LAB — PROTIME-INR
INR: 1.9 — ABNORMAL HIGH (ref 0.00–1.49)
Prothrombin Time: 22.8 seconds — ABNORMAL HIGH (ref 11.6–15.2)

## 2010-12-10 LAB — DIFFERENTIAL
Lymphocytes Relative: 19 % (ref 12–46)
Lymphs Abs: 1.5 10*3/uL (ref 0.7–4.0)
Neutrophils Relative %: 70 % (ref 43–77)

## 2010-12-10 LAB — CBC
Platelets: 289 10*3/uL (ref 150–400)
WBC: 8.2 10*3/uL (ref 4.0–10.5)

## 2010-12-20 ENCOUNTER — Emergency Department (HOSPITAL_COMMUNITY)
Admission: EM | Admit: 2010-12-20 | Discharge: 2010-12-20 | Disposition: A | Discharge status: Home / Self Care | Payer: Medicare Other | Attending: Emergency Medicine | Admitting: Emergency Medicine

## 2010-12-20 ENCOUNTER — Emergency Department (HOSPITAL_COMMUNITY): Payer: Medicare Other

## 2010-12-20 DIAGNOSIS — Z86718 Personal history of other venous thrombosis and embolism: Secondary | ICD-10-CM | POA: Insufficient documentation

## 2010-12-20 DIAGNOSIS — E119 Type 2 diabetes mellitus without complications: Secondary | ICD-10-CM | POA: Insufficient documentation

## 2010-12-20 DIAGNOSIS — M169 Osteoarthritis of hip, unspecified: Secondary | ICD-10-CM | POA: Insufficient documentation

## 2010-12-20 DIAGNOSIS — I10 Essential (primary) hypertension: Secondary | ICD-10-CM | POA: Insufficient documentation

## 2010-12-20 DIAGNOSIS — E7800 Pure hypercholesterolemia, unspecified: Secondary | ICD-10-CM | POA: Insufficient documentation

## 2010-12-20 DIAGNOSIS — M25559 Pain in unspecified hip: Secondary | ICD-10-CM | POA: Insufficient documentation

## 2010-12-20 DIAGNOSIS — M1610 Unilateral primary osteoarthritis, unspecified hip: Secondary | ICD-10-CM | POA: Insufficient documentation

## 2011-01-11 NOTE — H&P (Signed)
NAMELEVAEH, VICE                ACCOUNT NO.:  [id number]   MEDICAL RECORD NO.:  [id number]          PATIENT TYPE:  INP   LOCATION:  3704                         FACILITY:  [facility]   PHYSICIAN:  Kela Millin, M.D.DATE OF BIRTH:  [birth year]   DATE OF ADMISSION:  11/07/2005  DATE OF DISCHARGE:                                HISTORY & PHYSICAL   PRIMARY CARE PHYSICIAN:  Unassigned Benetta Spar R. Rankins, M.D.).   CHIEF COMPLAINT:  Left lower extremity pain and swelling.   HISTORY OF PRESENT ILLNESS:  The patient is a 75 year old white female with  a past medical history significant for history of right lower extremity DVT  12 years or so ago that was attributed to birth control pills, diabetes  mellitus, hypertension and hypercholesterolemia, who presents with  complaints of worsening left lower extremity pain and swelling over the past  2 weeks.  Ms. Manka states that she has had pain and swelling on and off in  that leg for some time, but that particularly since January she has had  swelling in that leg, and the pain worsened in the past 2 weeks.  She states  that she has been shuffling back and forth to [place] by train to see her  mother, and it is an 8-1/2 hour ride, and her last visit to [place] was in  January.  She denies chest pain, cough, hemoptysis, fevers, nausea,  vomiting, diarrhea, hematemesis, and no hematochezia.   The patient was seen in the ER, and Doppler ultrasound of her extremity  revealed a left femoral DVT, and she is admitted to the [facility] hospitalist  service for further evaluation and management.   PAST MEDICAL HISTORY:  As stated above.   MEDICATIONS:  1.  Metformin 2000 mg daily.  2.  Lipitor 40 mg daily.  3.  Lasix 40 mg daily.  4.  Norvasc.  5.  Potassium 20 mEq daily.   ALLERGIES:  No known drug allergies.   SOCIAL HISTORY:  She quit tobacco 35 years ago.  Denies alcohol.   FAMILY HISTORY:  Her son has had a DVT.  Her daughter had an  unspecified  cancer and also had a DVT.   REVIEW OF SYSTEMS:  As per HPI.  Other review of systems negative.   PHYSICAL EXAMINATION:  GENERAL:  The patient is a pleasant black female,  obese, in no apparent distress.  VITAL SIGNS:  Temperature of 98, blood pressure 176/90, pulse of 74,  respiratory rate 20, O2 saturation of 95%.  HEENT:  Pupils equal, round and reactive to light.  Extraocular movements  intact.  Sclerae anicteric.  No oral exudates.  Moist mucous membranes.  NECK:  Supple.  No adenopathy.  No thyromegaly.  No JVD.  LUNGS:  Clear to auscultation bilaterally.  No crackles or wheezes.  CARDIOVASCULAR:  Regular rate and rhythm.  Normal S1 and S2.  No S3  appreciated.  ABDOMEN:  Soft.  Normal bowel sounds present.  Nontender, nondistended.  No  organomegaly and no masses palpable.  EXTREMITIES:  She has +2 to 3 edema in  her left lower leg, and on the medial  aspect of her left leg, just a little above her ankle, she has an indurated  area that is more tender to palpation, and also hyperpigmented.  She also  has left calf tenderness.  Her right lower extremity is within normal  limits.  NEUROLOGIC:  Alert and oriented x3.  Cranial nerves II-XII grossly intact.  Nonfocal exam.   LABORATORY DATA:  A lower extremity ultrasound revealed left femoral DVT.  White cell count of 7.1, hemoglobin 12.5, hematocrit 37, platelet count 244,  neutrophil count 65%.  Brain natruretic peptide is less than 30.   ASSESSMENT AND PLAN:  1.  Left lower extremity deep vein thrombosis with (?) mild cellulitis.  As      discussed above, this is the patient's second deep vein thrombosis, and      also positive family history of deep vein thromboses.  Will obtain      hypercoagulable panel.  Start anticoagulation with Lovenox and Coumadin.      Will start Keflex empirically for possible cellulitis.  2.  Diabetes mellitus.  monitor accuchecks, sliding scale coverage and      continue metformin.   3.  Hypertension.  Continue outpatient medications.  4.  Hypercholesterolemia.  Continue Lipitor.      Kela Millin, M.D.  Electronically Signed     ACV/MEDQ  D:  11/07/2005  T:  11/08/2005  Job:  [id number]   cc:   Benetta Spar R. Rankins, M.D.  Fax: [phone]

## 2011-01-11 NOTE — Discharge Summary (Signed)
NAMEJAHMIYAH, Vickie Pham                ACCOUNT NO.:  [id number]   MEDICAL RECORD NO.:  [id number]          PATIENT TYPE:  INP   LOCATION:  3704                         FACILITY:  [facility]   PHYSICIAN:  Melissa L. Ladona Ridgel, MD  DATE OF BIRTH:  May 19, 1936   DATE OF ADMISSION:  11/07/2005  DATE OF DISCHARGE:  11/09/2005                                 DISCHARGE SUMMARY   CHIEF COMPLAINT AT TIME OF ADMISSION:  Left lower extremity pain and  swelling.   DISCHARGE DIAGNOSES:  1.  Left lower extremity deep vein thrombosis with mild cellulitis.  The      patient was placed on Lovenox and started on Coumadin.  She will      provide herself with injections at home and follow up with her primary      care physician on Monday to check her INR.  A hypercoagulability panel      has been sent for this patient.  At this time, the available results are      as follows.  Her homocysteine level is 13.7.  Other results are pending.  2.  Diabetes.  The patient's blood sugars during the course of this hospital      stay have been moderately elevated. A hemoglobin A1c was not ordered      during this hospital course.  We will continue metformin and request her      primary care physician please follow up with a hemoglobin A1c and      consider titrating other medications to establish better control of her      blood sugars.  3.  Hypertension.  The patient was placed on Norvasc 10 mg once daily with      moderately good results.  Her blood pressure has been ranging from      systolic 130 to 150.  Therefore, again, ask her primary care physician      to please follow up on her blood pressure control on the new      medications. We have continued Lasix in addition to the new Norvasc.  4.  Increased cholesterol. The patient will continue on her home dose of      Zocor and follow up for regular screening of cholesterol with Dr.      Luciana Axe.   DISCHARGE MEDICATIONS:  1.  Keflex 250 mg 4 times daily for 3 more days.  2.  Potassium chloride 20 mEq once daily.  3.  Lasix will be resumed at her home dose.  4.  Zocor will be resumed at her home dose.  The patient evidently did not      have access to this information at the time of admission.  5.  Continue with metformin 500 mg twice daily and may need to have up      titration of this medication.  6.  A new medication for her is Norvasc 10 mg once daily.   The patient will be instructed to try to obtain appropriately sized support  hose with mild level of support to prevent post lytic edema.   The patient  is referred to Dr. Luciana Axe on Monday.  I have also provided my  phone number; should she not be able to obtain followup of care, I will  assist in arranging this on Monday morning.   HISTORY OF PRESENT ILLNESS:  The patient is a very pleasant 75 year old  African-American female who has a past medical history for DVT.  The patient  presented to the emergency room with complaint of left lower extremity pain  and swelling over the past 2 weeks.  The patient states that she has been  having to travel since January back and forth from [place] by train, need  to have a ride.  The patient states she has not had any chest pain, cough,  or hemoptysis, and came to the emergency room where she underwent ultrasound  Dopplers of lower extremities which revealed a left femoral DVT.  The  patient was admitted to the general medical service and started on Lovenox  along with Coumadin.  The patient was trained to give her own Lovenox  injections and will be discharged to home to follow up with her primary care  physician on Monday for a PT/INR.  The patient's other significant past  medical history for diabetes, hypertension, and hypercholesterolemia.  Of  note, the patient's previous lower extremity edema was associated with birth  control at that time.  No other information is available regarding a  hypercoagulability workup.  Of note during this admission, a   hypercoagulability panel was sent.  The homocysteine is the only test that  is back at this time, and her primary care physician can access the  information on the [facility] computerized medical record.   Of note, the patient is Jehovah's Witness and declines blood transfusion.  I, therefore, have instructed her to be very cautious and call Dr. Luciana Axe  should she develop any spontaneous bleeding.  I will also request that she  attempt to find support hose that will comfortably fit in order to prevent  the post lytic phlebitis.   PERTINENT LABORATORY DATA:  During the hospital stay, the patient's  hemoglobin was 12.5, hematocrit 37.  BUN 14, creatinine 0.9.  As stated, her  hypercoagulability study is still outstanding for some measures, but her  homocysteine level is 13.7.  Her INR was 0.9 with a PT of 12.6, and that was  on November 09, 2005.   At this time, the patient is deemed stable and capable to administer Lovenox  injections to herself and follow up as an outpatient for PT and INR.   Of note, the patient did have some warmth and an area of cellulitis that was  tender on admission.  I, therefore, will continue her on Keflex at the time  of discharge for 3 more days.   The patient's blood pressure also was moderately uncontrolled, and,  therefore, she was started on Norvasc 10 mg once daily in addition to the  Lasix she was taken at home.   The patient's discharge status is stable.  Her disposition is to home to  follow up with Dr. Luciana Axe.      Melissa L. Ladona Ridgel, MD  Electronically Signed     MLT/MEDQ  D:  11/09/2005  T:  11/09/2005  Job:  [id number]   cc:   Dr.  Luciana Axe

## 2011-03-15 ENCOUNTER — Other Ambulatory Visit: Payer: Self-pay | Admitting: Physician Assistant

## 2011-05-09 ENCOUNTER — Other Ambulatory Visit: Payer: Self-pay | Admitting: Physician Assistant

## 2011-07-16 ENCOUNTER — Other Ambulatory Visit: Payer: Self-pay | Admitting: Physician Assistant

## 2011-08-16 ENCOUNTER — Other Ambulatory Visit: Payer: Self-pay | Admitting: Physician Assistant

## 2011-08-29 ENCOUNTER — Other Ambulatory Visit: Payer: Self-pay | Admitting: Physician Assistant

## 2011-10-31 ENCOUNTER — Other Ambulatory Visit: Payer: Self-pay | Admitting: Physician Assistant

## 2012-09-21 ENCOUNTER — Other Ambulatory Visit (HOSPITAL_COMMUNITY): Payer: Self-pay | Admitting: Internal Medicine

## 2012-09-21 DIAGNOSIS — Z1231 Encounter for screening mammogram for malignant neoplasm of breast: Secondary | ICD-10-CM

## 2012-10-01 ENCOUNTER — Ambulatory Visit (HOSPITAL_COMMUNITY): Payer: Medicare Other

## 2012-10-07 ENCOUNTER — Ambulatory Visit (HOSPITAL_COMMUNITY): Payer: Medicare Other

## 2012-10-14 ENCOUNTER — Ambulatory Visit (HOSPITAL_COMMUNITY)
Admission: RE | Admit: 2012-10-14 | Discharge: 2012-10-14 | Disposition: A | Discharge status: Home / Self Care | Payer: Medicare Other | Source: Ambulatory Visit | Attending: Internal Medicine | Admitting: Internal Medicine

## 2012-10-14 DIAGNOSIS — Z1231 Encounter for screening mammogram for malignant neoplasm of breast: Secondary | ICD-10-CM | POA: Insufficient documentation

## 2013-05-07 ENCOUNTER — Encounter (HOSPITAL_COMMUNITY): Payer: Self-pay | Admitting: *Deleted

## 2013-05-07 ENCOUNTER — Emergency Department (HOSPITAL_COMMUNITY): Payer: Medicare Other

## 2013-05-07 ENCOUNTER — Emergency Department (HOSPITAL_COMMUNITY)
Admission: EM | Admit: 2013-05-07 | Discharge: 2013-05-07 | Disposition: A | Discharge status: Home / Self Care | Payer: Medicare Other | Attending: Emergency Medicine | Admitting: Emergency Medicine

## 2013-05-07 DIAGNOSIS — Z86718 Personal history of other venous thrombosis and embolism: Secondary | ICD-10-CM | POA: Insufficient documentation

## 2013-05-07 DIAGNOSIS — R609 Edema, unspecified: Secondary | ICD-10-CM | POA: Insufficient documentation

## 2013-05-07 DIAGNOSIS — M545 Low back pain, unspecified: Secondary | ICD-10-CM | POA: Insufficient documentation

## 2013-05-07 DIAGNOSIS — Z79899 Other long term (current) drug therapy: Secondary | ICD-10-CM | POA: Insufficient documentation

## 2013-05-07 DIAGNOSIS — Z87891 Personal history of nicotine dependence: Secondary | ICD-10-CM | POA: Insufficient documentation

## 2013-05-07 DIAGNOSIS — G8929 Other chronic pain: Secondary | ICD-10-CM | POA: Insufficient documentation

## 2013-05-07 DIAGNOSIS — E119 Type 2 diabetes mellitus without complications: Secondary | ICD-10-CM | POA: Insufficient documentation

## 2013-05-07 DIAGNOSIS — M25559 Pain in unspecified hip: Secondary | ICD-10-CM | POA: Insufficient documentation

## 2013-05-07 DIAGNOSIS — I10 Essential (primary) hypertension: Secondary | ICD-10-CM | POA: Insufficient documentation

## 2013-05-07 HISTORY — DX: Unspecified osteoarthritis, unspecified site: M19.90

## 2013-05-07 HISTORY — DX: Acute embolism and thrombosis of unspecified deep veins of unspecified lower extremity: I82.409

## 2013-05-07 HISTORY — DX: Type 2 diabetes mellitus without complications: E11.9

## 2013-05-07 HISTORY — DX: Essential (primary) hypertension: I10

## 2013-05-07 HISTORY — DX: Lymphedema, not elsewhere classified: I89.0

## 2013-05-07 MED ORDER — HYDROCODONE-ACETAMINOPHEN 5-325 MG PO TABS: 2.0000 | ORAL_TABLET | ORAL | Status: DC | PRN

## 2013-05-07 MED ORDER — HYDROCODONE-ACETAMINOPHEN 5-325 MG PO TABS
1.0000 | ORAL_TABLET | Freq: Once | ORAL | Status: AC
  Administered 2013-05-07: 1 via ORAL
  Filled 2013-05-07: qty 1

## 2013-05-07 NOTE — ED Notes (Signed)
Pt states she is aware that she will become drowsy with pain meds but does not want to stay after being given pain meds.

## 2013-05-07 NOTE — ED Provider Notes (Signed)
CSN: [id number]     Arrival date & time 05/07/13  0756 History   First MD Initiated Contact with Patient 05/07/13 0813     Chief Complaint  Patient presents with  . Back Pain  . Hip Pain   (Consider location/radiation/quality/duration/timing/severity/associated sxs/prior Treatment) HPI  77 year old female with history of chronic left hip pain presents to the ER complaining of worsening hip pain.  Patient state early in the week she was moving furniture at home. States she has heavy wood furniture and she was moving by herself. She did not injure herself at that time. However subsequently she developed worsening low back pain and left hip pain. Describe pain as a sharp achy sensation, worsening with movement, improved with rest. Pain radiates across her lower back. She has been taking her home medication including tramadol and using icy hot with minimal improvement. Through the urging of a friend, she decided to come to ER for further evaluation. Otherwise patient denies fever, lightheadedness, dizziness, abdominal pain, dysuria, hematuria, new numbness or weakness.  No urinary/bowel incontinence, no saddle paresthesia.   No complaints of chest pain or shortness of breath. No other complaint. Patient use a cane to walk at home and states she is able to ambulate.    Past Medical History  Diagnosis Date  . Hypertension   . Diabetes mellitus without complication   . Lymphedema of lower extremity   . DVT (deep venous thrombosis)   . Arthritis    Past Surgical History  Procedure Laterality Date  . Abdominal hysterectomy    . Cholecystectomy     No family history on file. History  Substance Use Topics  . Smoking status: Former Smoker    Types: Cigarettes  . Smokeless tobacco: Not on file  . Alcohol Use: No   OB History   Grav Para Term Preterm Abortions TAB SAB Ect Mult Living                 Review of Systems  Constitutional: Negative for fever.  Musculoskeletal: Positive for back  pain.  Skin: Negative for rash.  Neurological: Negative for numbness.    Allergies  Review of patient's allergies indicates no known allergies.  Home Medications   Current Outpatient Rx  Name  Route  Sig  Dispense  Refill  . glyBURIDE-metformin (GLUCOVANCE) 5-500 MG per tablet      TAKE TWO TABLETS BY MOUTH TWICE DAILY   120 tablet   1   . terazosin (HYTRIN) 2 MG capsule      TAKE THREE CAPSULES BY MOUTH EVERY DAY AT BEDTIME   90 capsule   1    There were no vitals taken for this visit. Physical Exam  Nursing note and vitals reviewed. Constitutional: She is oriented to person, place, and time. She appears well-developed and well-nourished.  Moderately obese  HENT:  Head: Normocephalic and atraumatic.  Eyes: Conjunctivae are normal.  Neck: Neck supple.  Cardiovascular: Normal rate and regular rhythm.   Pulmonary/Chest: Effort normal and breath sounds normal.  Abdominal: Soft. There is no tenderness.  No abdominal pulsatile mass  Musculoskeletal: She exhibits edema (Chronic pitting edema in to bilateral lower extremities with chronic venous stasis.) and tenderness (Paralumbar tenderness on exam worsening with back flexion, and rotation. Pain with left hip flexion abduction and adduction.. point tenderness at L sacral iliac joint).   No significant midline spine tenderness, crepitus or step-off.  Neurological: She is alert and oriented to person, place, and time.  Normal gait using  a cane.  Skin: No rash noted.  Psychiatric: She has a normal mood and affect.    ED Course  Procedures (including critical care time)  8:31 AM Patient here with low back pain left hip pain after moving heavy furniture. Pain is reproducible on exam, likely muscle skeletal. No red flags. Low suspicion for AAA or aortic dissection.  Will obtain xray of low back.  Care discussed with attending.  9:52 AM Xray shows degenerative changes, no acute finding.  Plan to give a short course of pain  meds and for her to f/u with PCP for further care, return precaution discussed.   Labs Review Labs Reviewed - No data to display Imaging Review Dg Lumbar Spine Complete  05/07/2013   *RADIOLOGY REPORT*  Clinical Data: Low back pain  LUMBAR SPINE - COMPLETE 4+ VIEW  Comparison: None  Findings: Mild to moderate levoscoliosis.  Disc degeneration and spondylosis throughout the lumbar spine.  There is facet degeneration L4-5 and L5-S1.  Negative for fracture or pars defect. No mass lesion.  Degenerative change in the left hip joint with protrusio acetabulum and joint space narrowing.  IMPRESSION: Lumbar scoliosis and multilevel degenerative change and spondylosis.  No superimposed acute abnormality.   Original Report Authenticated By: Janeece Riggers, M.D.    MDM   1. Low back pain   2. Chronic left hip pain    BP 128/58  Pulse 65  Temp(Src) 98.1 F (36.7 C) (Oral)  Resp 18  SpO2 96%  I have reviewed nursing notes and vital signs. I personally reviewed the imaging tests through PACS system  I reviewed available ER/hospitalization records thought the EMR     Fayrene Helper, [place] 05/07/13 1610

## 2013-05-07 NOTE — ED Provider Notes (Signed)
Medical screening examination/treatment/procedure(s) were conducted as a shared visit with non-physician practitioner(s) and myself.  I personally evaluated the patient during the encounter.  Patient lives alone at home was moving heavy furniture by herself has an exacerbation of left lower sacroiliac region lumbar pain and like she has had in the past with no new weakness or numbness no change in bowel or bladder function no fall no specific trauma with left sacroiliac region tenderness on examination with baseline chronic edema to both lower legs.  Hurman Horn, MD 05/08/13 [phone]

## 2013-05-07 NOTE — ED Notes (Signed)
Pt with hx of chronic L hip pain to ED c/o increased hip pain.  Recently moved furniture and states pain to L lower back that radiates to L leg and R lower back.

## 2013-07-08 ENCOUNTER — Encounter (HOSPITAL_COMMUNITY): Payer: Self-pay | Admitting: Emergency Medicine

## 2013-07-08 ENCOUNTER — Emergency Department (INDEPENDENT_AMBULATORY_CARE_PROVIDER_SITE_OTHER)
Admission: EM | Admit: 2013-07-08 | Discharge: 2013-07-08 | Disposition: A | Discharge status: Home / Self Care | Payer: Medicare Other | Source: Home / Self Care | Attending: Emergency Medicine | Admitting: Emergency Medicine

## 2013-07-08 DIAGNOSIS — S91209A Unspecified open wound of unspecified toe(s) with damage to nail, initial encounter: Secondary | ICD-10-CM

## 2013-07-08 DIAGNOSIS — S91109A Unspecified open wound of unspecified toe(s) without damage to nail, initial encounter: Secondary | ICD-10-CM

## 2013-07-08 MED ORDER — MUPIROCIN 2 % EX OINT: 1.0000 "application " | TOPICAL_OINTMENT | Freq: Three times a day (TID) | CUTANEOUS | Status: DC

## 2013-07-08 NOTE — ED Provider Notes (Signed)
Chief Complaint:   Chief Complaint  Patient presents with  . Nail Problem    History of Present Illness:   Vickie Pham is a 77 year old type II diabetic female who is concerned about her left great toenail. She traumatized the nail to 3 days ago while putting in a slipper. The mid one third of the nail split of, down to the nailbed. It's a little bit tender to touch. The medial and lateral thirds of the nail are intact, but they had sharp edges and it caught on her clothing. There's been no swelling, purulent drainage, redness, or fever.  Review of Systems:  Other than noted above, the patient denies any of the following symptoms: Systemic:  No fevers, chills, sweats, or aches.  No fatigue or tiredness. Musculoskeletal:  No joint pain, arthritis, bursitis, swelling, back pain, or neck pain. Neurological:  No muscular weakness, paresthesias, headache, or trouble with speech or coordination.  No dizziness.  PMFSH:  Past medical history, family history, social history, meds, and allergies were reviewed.  She has no medication allergies, she takes amlodipine, Lipitor, furosemide, Glucovance, hydralazine, potassium chloride, Hytrin, Ultram, Coumadin, and Norco. She has a history of high blood pressure, diabetes, elevated cholesterol, and thrombophlebitis.  Physical Exam:   Vital signs:  BP 177/63  Pulse 59  Temp(Src) 98.1 F (36.7 C) (Oral)  Resp 16  SpO2 100% Gen:  Alert and oriented times 3.  In no distress. Musculoskeletal: The midportion of the left great toenail has been partially avulsed. It's torn down to the nailbed, and about 1-2 mm of the nail bed is exposed. There is no purulent drainage, erythema, or swelling. There is minimal tenderness to palpation.  Otherwise, all joints had a full a ROM with no swelling, bruising or deformity.  No edema, pulses full. Extremities were warm and pink.  Capillary refill was brisk.  Skin:  Clear, warm and dry.  No rash. Neuro:  Alert and oriented  times 3.  Muscle strength was normal.  Sensation was intact to light touch.   Course in Urgent Care Center:   The toenail was prepped with alcohol, the medial and lateral thirds were snipped off with scissors, and the sharp edges were trimmed with a scalpel blade. Antibiotic ointment was applied and a sterile, nonstick dressing. She was instructed in wound care.  Assessment:  The encounter diagnosis was Toenail avulsion, initial encounter.  Suggested she soak this for about 5 minutes in warm, and some salt water in them file with a nail file to keep the sharp edges from getting caught in her socks or shoes. She should apply Bactroban ointment thereafter followed by light occlusion with a Band-Aid. She will watch for signs of infection and return if there any further problems.  Plan:   1.  Meds:  The following meds were prescribed:   Discharge Medication List as of 07/08/2013  9:31 AM    START taking these medications   Details  mupirocin ointment (BACTROBAN) 2 % Apply 1 application topically 3 (three) times daily., Starting 07/08/2013, Until Discontinued, Normal        2.  Patient Education/Counseling:  The patient was given appropriate handouts, self care instructions, and instructed in symptomatic relief, including routine nail care.  3.  Follow up:  The patient was told to follow up if no better in 3 to 4 days, if becoming worse in any way, and given some red flag symptoms such as any sign of infection which would prompt immediate return.  Follow up here or with her primary care physician as needed.     Reuben Likes, MD 07/08/13 [phone]

## 2013-07-08 NOTE — ED Notes (Signed)
Pt c/o left greater toe nail pain Has a split in the middle of the nail; bleeding, painful On coumadin... Alert w/no signs of acute distress.

## 2013-07-13 ENCOUNTER — Ambulatory Visit (INDEPENDENT_AMBULATORY_CARE_PROVIDER_SITE_OTHER): Payer: Medicare Other | Admitting: Podiatry

## 2013-07-13 ENCOUNTER — Encounter: Payer: Self-pay | Admitting: Podiatry

## 2013-07-13 VITALS — BP 169/80 | HR 65 | Resp 16

## 2013-07-13 DIAGNOSIS — B351 Tinea unguium: Secondary | ICD-10-CM

## 2013-07-13 DIAGNOSIS — M79609 Pain in unspecified limb: Secondary | ICD-10-CM

## 2013-07-13 NOTE — Progress Notes (Signed)
Vickie Pham presents today for her routine nail debridement 1 through 5 bilateral. She also states that she was here last Thursday and could not be seen for bleeding toe hallux right. She went to the urgent care.  Objective: Vital signs are stable she is alert and oriented x3. Pulses are palpable bilateral. There is no erythema edema cellulitis drainage or odor to the hallux right. There is a portion of the nail missing where she broken off into the quick and this is where is bleeding from. Her nails are elongated and dystrophic and painful.  Assessment: Pain in limb secondary to onychomycosis.  Plan: Debridement of nails 1 through 5 bilateral is a covered service secondary to pain. Followup with me in 3 months.

## 2013-07-13 NOTE — Patient Instructions (Signed)
Diabetes and Foot Care Diabetes may cause you to have problems because of poor blood supply (circulation) to your feet and legs. This may cause the skin on your feet to become thinner, break easier, and heal more slowly. Your skin may become dry, and the skin may peel and crack. You may also have nerve damage in your legs and feet causing decreased feeling in them. You may not notice minor injuries to your feet that could lead to infections or more serious problems. Taking care of your feet is one of the most important things you can do for yourself.  HOME CARE INSTRUCTIONS  Wear shoes at all times, even in the house. Do not go barefoot. Bare feet are easily injured.  Check your feet daily for blisters, cuts, and redness. If you cannot see the bottom of your feet, use a mirror or ask someone for help.  Wash your feet with warm water (do not use hot water) and mild soap. Then pat your feet and the areas between your toes until they are completely dry. Do not soak your feet as this can dry your skin.  Apply a moisturizing lotion or petroleum jelly (that does not contain alcohol and is unscented) to the skin on your feet and to dry, brittle toenails. Do not apply lotion between your toes.  Trim your toenails straight across. Do not dig under them or around the cuticle. File the edges of your nails with an emery board or nail file.  Do not cut corns or calluses or try to remove them with medicine.  Wear clean socks or stockings every day. Make sure they are not too tight. Do not wear knee-high stockings since they may decrease blood flow to your legs.  Wear shoes that fit properly and have enough cushioning. To break in new shoes, wear them for just a few hours a day. This prevents you from injuring your feet. Always look in your shoes before you put them on to be sure there are no objects inside.  Do not cross your legs. This may decrease the blood flow to your feet.  If you find a minor scrape,  cut, or break in the skin on your feet, keep it and the skin around it clean and dry. These areas may be cleansed with mild soap and water. Do not cleanse the area with peroxide, alcohol, or iodine.  When you remove an adhesive bandage, be sure not to damage the skin around it.  If you have a wound, look at it several times a day to make sure it is healing.  Do not use heating pads or hot water bottles. They may burn your skin. If you have lost feeling in your feet or legs, you may not know it is happening until it is too late.  Make sure your health care provider performs a complete foot exam at least annually or more often if you have foot problems. Report any cuts, sores, or bruises to your health care provider immediately. SEEK MEDICAL CARE IF:   You have an injury that is not healing.  You have cuts or breaks in the skin.  You have an ingrown nail.  You notice redness on your legs or feet.  You feel burning or tingling in your legs or feet.  You have pain or cramps in your legs and feet.  Your legs or feet are numb.  Your feet always feel cold. SEEK IMMEDIATE MEDICAL CARE IF:   There is increasing redness,   swelling, or pain in or around a wound.  There is a red line that goes up your leg.  Pus is coming from a wound.  You develop a fever or as directed by your health care provider.  You notice a bad smell coming from an ulcer or wound. Document Released: 08/09/2000 Document Revised: 04/14/2013 Document Reviewed: 01/19/2013 [employer] Patient Information 2014 [employer].  

## 2013-10-12 ENCOUNTER — Ambulatory Visit: Payer: Medicare Other | Admitting: Podiatry

## 2013-10-28 ENCOUNTER — Ambulatory Visit (INDEPENDENT_AMBULATORY_CARE_PROVIDER_SITE_OTHER): Payer: Medicare Other | Admitting: Podiatry

## 2013-10-28 ENCOUNTER — Encounter: Payer: Self-pay | Admitting: Podiatry

## 2013-10-28 VITALS — BP 137/74 | HR 66 | Resp 16

## 2013-10-28 DIAGNOSIS — B351 Tinea unguium: Secondary | ICD-10-CM

## 2013-10-28 DIAGNOSIS — M79609 Pain in unspecified limb: Secondary | ICD-10-CM

## 2013-10-28 NOTE — Progress Notes (Signed)
Trim these painful toenails   Objective: Pulses are stable she is alert and oriented x3. Nails are thick yellow dystrophic onychomycotic and painful palpation.  Assessment: Pain in limb secondary to onychomycosis 1 through 5 bilateral.  Plan: Debridement of nails 1 through 5 bilateral covered service secondary to pain and onychomycosis.

## 2014-01-11 ENCOUNTER — Ambulatory Visit (INDEPENDENT_AMBULATORY_CARE_PROVIDER_SITE_OTHER): Payer: Medicare Other | Admitting: Podiatry

## 2014-01-11 ENCOUNTER — Encounter: Payer: Self-pay | Admitting: Podiatry

## 2014-01-11 VITALS — BP 152/71 | HR 67 | Resp 16

## 2014-01-11 DIAGNOSIS — B351 Tinea unguium: Secondary | ICD-10-CM

## 2014-01-11 DIAGNOSIS — M79609 Pain in unspecified limb: Secondary | ICD-10-CM

## 2014-01-11 NOTE — Progress Notes (Signed)
She presents today with a chief complaint of painful elongated toenails.  Objective: Nails are thick yellow dystrophic with mycotic painful palpation.  Assessment: Pain in limb secondary to onychomycosis 1 through 5 bilateral.  Plan: Debridement of nails in thickness and length as cover service secondary to pain.

## 2014-01-11 NOTE — Patient Instructions (Signed)
Diabetes and Foot Care Diabetes may cause you to have problems because of poor blood supply (circulation) to your feet and legs. This may cause the skin on your feet to become thinner, break easier, and heal more slowly. Your skin may become dry, and the skin may peel and crack. You may also have nerve damage in your legs and feet causing decreased feeling in them. You may not notice minor injuries to your feet that could lead to infections or more serious problems. Taking care of your feet is one of the most important things you can do for yourself.  HOME CARE INSTRUCTIONS  Wear shoes at all times, even in the house. Do not go barefoot. Bare feet are easily injured.  Check your feet daily for blisters, cuts, and redness. If you cannot see the bottom of your feet, use a mirror or ask someone for help.  Wash your feet with warm water (do not use hot water) and mild soap. Then pat your feet and the areas between your toes until they are completely dry. Do not soak your feet as this can dry your skin.  Apply a moisturizing lotion or petroleum jelly (that does not contain alcohol and is unscented) to the skin on your feet and to dry, brittle toenails. Do not apply lotion between your toes.  Trim your toenails straight across. Do not dig under them or around the cuticle. File the edges of your nails with an emery board or nail file.  Do not cut corns or calluses or try to remove them with medicine.  Wear clean socks or stockings every day. Make sure they are not too tight. Do not wear knee-high stockings since they may decrease blood flow to your legs.  Wear shoes that fit properly and have enough cushioning. To break in new shoes, wear them for just a few hours a day. This prevents you from injuring your feet. Always look in your shoes before you put them on to be sure there are no objects inside.  Do not cross your legs. This may decrease the blood flow to your feet.  If you find a minor scrape,  cut, or break in the skin on your feet, keep it and the skin around it clean and dry. These areas may be cleansed with mild soap and water. Do not cleanse the area with peroxide, alcohol, or iodine.  When you remove an adhesive bandage, be sure not to damage the skin around it.  If you have a wound, look at it several times a day to make sure it is healing.  Do not use heating pads or hot water bottles. They may burn your skin. If you have lost feeling in your feet or legs, you may not know it is happening until it is too late.  Make sure your health care provider performs a complete foot exam at least annually or more often if you have foot problems. Report any cuts, sores, or bruises to your health care provider immediately. SEEK MEDICAL CARE IF:   You have an injury that is not healing.  You have cuts or breaks in the skin.  You have an ingrown nail.  You notice redness on your legs or feet.  You feel burning or tingling in your legs or feet.  You have pain or cramps in your legs and feet.  Your legs or feet are numb.  Your feet always feel cold. SEEK IMMEDIATE MEDICAL CARE IF:   There is increasing redness,   swelling, or pain in or around a wound.  There is a red line that goes up your leg.  Pus is coming from a wound.  You develop a fever or as directed by your health care provider.  You notice a bad smell coming from an ulcer or wound. Document Released: 08/09/2000 Document Revised: 04/14/2013 Document Reviewed: 01/19/2013 [employer] Patient Information 2014 [employer].  

## 2014-03-23 ENCOUNTER — Other Ambulatory Visit (HOSPITAL_COMMUNITY): Payer: Self-pay | Admitting: Internal Medicine

## 2014-03-23 DIAGNOSIS — Z1231 Encounter for screening mammogram for malignant neoplasm of breast: Secondary | ICD-10-CM

## 2014-03-31 ENCOUNTER — Ambulatory Visit (HOSPITAL_COMMUNITY): Payer: Medicare Other

## 2014-04-05 ENCOUNTER — Ambulatory Visit (HOSPITAL_COMMUNITY)
Admission: RE | Admit: 2014-04-05 | Discharge: 2014-04-05 | Disposition: A | Discharge status: Home / Self Care | Payer: Medicare Other | Source: Ambulatory Visit | Attending: Internal Medicine | Admitting: Internal Medicine

## 2014-04-05 DIAGNOSIS — Z1231 Encounter for screening mammogram for malignant neoplasm of breast: Secondary | ICD-10-CM | POA: Insufficient documentation

## 2014-04-12 ENCOUNTER — Ambulatory Visit (INDEPENDENT_AMBULATORY_CARE_PROVIDER_SITE_OTHER): Payer: Medicare Other | Admitting: Podiatry

## 2014-04-12 DIAGNOSIS — M79676 Pain in unspecified toe(s): Secondary | ICD-10-CM

## 2014-04-12 DIAGNOSIS — B351 Tinea unguium: Secondary | ICD-10-CM

## 2014-04-12 DIAGNOSIS — M79609 Pain in unspecified limb: Secondary | ICD-10-CM

## 2014-04-12 NOTE — Patient Instructions (Signed)
Diabetes and Foot Care Diabetes may cause you to have problems because of poor blood supply (circulation) to your feet and legs. This may cause the skin on your feet to become thinner, break easier, and heal more slowly. Your skin may become dry, and the skin may peel and crack. You may also have nerve damage in your legs and feet causing decreased feeling in them. You may not notice minor injuries to your feet that could lead to infections or more serious problems. Taking care of your feet is one of the most important things you can do for yourself.  HOME CARE INSTRUCTIONS  Wear shoes at all times, even in the house. Do not go barefoot. Bare feet are easily injured.  Check your feet daily for blisters, cuts, and redness. If you cannot see the bottom of your feet, use a mirror or ask someone for help.  Wash your feet with warm water (do not use hot water) and mild soap. Then pat your feet and the areas between your toes until they are completely dry. Do not soak your feet as this can dry your skin.  Apply a moisturizing lotion or petroleum jelly (that does not contain alcohol and is unscented) to the skin on your feet and to dry, brittle toenails. Do not apply lotion between your toes.  Trim your toenails straight across. Do not dig under them or around the cuticle. File the edges of your nails with an emery board or nail file.  Do not cut corns or calluses or try to remove them with medicine.  Wear clean socks or stockings every day. Make sure they are not too tight. Do not wear knee-high stockings since they may decrease blood flow to your legs.  Wear shoes that fit properly and have enough cushioning. To break in new shoes, wear them for just a few hours a day. This prevents you from injuring your feet. Always look in your shoes before you put them on to be sure there are no objects inside.  Do not cross your legs. This may decrease the blood flow to your feet.  If you find a minor scrape,  cut, or break in the skin on your feet, keep it and the skin around it clean and dry. These areas may be cleansed with mild soap and water. Do not cleanse the area with peroxide, alcohol, or iodine.  When you remove an adhesive bandage, be sure not to damage the skin around it.  If you have a wound, look at it several times a day to make sure it is healing.  Do not use heating pads or hot water bottles. They may burn your skin. If you have lost feeling in your feet or legs, you may not know it is happening until it is too late.  Make sure your health care provider performs a complete foot exam at least annually or more often if you have foot problems. Report any cuts, sores, or bruises to your health care provider immediately. SEEK MEDICAL CARE IF:   You have an injury that is not healing.  You have cuts or breaks in the skin.  You have an ingrown nail.  You notice redness on your legs or feet.  You feel burning or tingling in your legs or feet.  You have pain or cramps in your legs and feet.  Your legs or feet are numb.  Your feet always feel cold. SEEK IMMEDIATE MEDICAL CARE IF:   There is increasing redness,   swelling, or pain in or around a wound.  There is a red line that goes up your leg.  Pus is coming from a wound.  You develop a fever or as directed by your health care provider.  You notice a bad smell coming from an ulcer or wound. Document Released: 08/09/2000 Document Revised: 04/14/2013 Document Reviewed: 01/19/2013 [employer] Patient Information 2015 [employer]. This information is not intended to replace advice given to you by your health care provider. Make sure you discuss any questions you have with your health care provider.  

## 2014-04-12 NOTE — Progress Notes (Signed)
   Subjective:    Patient ID: Vickie Pham, female    DOB: 01/06/1936, 78 y.o.   MRN: [id number]  HPI  Pt presents for nail debridement  Review of Systems     Objective:   Physical Exam: I have reviewed her past medical history medications allergies surgeries social history and review of systems. Pulses are palpable bilateral. Nails are thick yellow dystrophic onychomycotic and painful palpation.        Assessment & Plan:  Assessment: Pain in limb secondary to onychomycosis 1 through 5 bilateral.  Plan: Debridement of nails 1 through 5 bilateral covered service secondary to pain.

## 2014-07-14 ENCOUNTER — Ambulatory Visit (INDEPENDENT_AMBULATORY_CARE_PROVIDER_SITE_OTHER): Payer: Medicare Other | Admitting: Podiatry

## 2014-07-14 ENCOUNTER — Ambulatory Visit (INDEPENDENT_AMBULATORY_CARE_PROVIDER_SITE_OTHER): Payer: Medicare Other

## 2014-07-14 ENCOUNTER — Encounter: Payer: Self-pay | Admitting: Podiatry

## 2014-07-14 VITALS — Ht 70.0 in | Wt 256.0 lb

## 2014-07-14 DIAGNOSIS — G5792 Unspecified mononeuropathy of left lower limb: Secondary | ICD-10-CM

## 2014-07-14 DIAGNOSIS — M79676 Pain in unspecified toe(s): Secondary | ICD-10-CM

## 2014-07-14 DIAGNOSIS — M79605 Pain in left leg: Secondary | ICD-10-CM

## 2014-07-14 DIAGNOSIS — B351 Tinea unguium: Secondary | ICD-10-CM

## 2014-07-14 NOTE — Progress Notes (Signed)
   Subjective:    Patient ID: Vickie Pham, female    DOB: 01/09/1936, 78 y.o.   MRN: [id number]  HPI Comments: Pt states she began to have sudden sharp shooting pains in the left 1st toe, on and off without any known stimuli, since September.  Pt requests debridement of 10 toenails.  Toe Pain       Review of Systems  All other systems reviewed and are negative.      Objective:   Physical Exam: Pulses are strongly palpable bilateral. Nails are thick yellow dystrophic clinic mycotic and painful to palpation. She has pain to the hallux left with without erythema no edema saline drainage or odor. Pedal palpation to the toe itself particularly around the medial dorsal cutaneous nerve.        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 Pain in limb secondary to onychomycosis.  Plan: Discussed etiology pathology conservative versus surgical therapies. Debrided nails 1 through 5 bilateral. Discussed the possible need for an injection to the toe.

## 2014-09-02 ENCOUNTER — Ambulatory Visit: Payer: Medicare Other | Admitting: *Deleted

## 2014-09-02 DIAGNOSIS — G5792 Unspecified mononeuropathy of left lower limb: Secondary | ICD-10-CM

## 2014-09-02 NOTE — Patient Instructions (Signed)
WE WILL CALL WHEN YOUR SHOES AND INSERTS HAVE ARRIVED

## 2014-09-02 NOTE — Progress Notes (Signed)
PATIENT PRESENTS FOR DIABETIC SHOE MEASUREMENT AND MOLDING

## 2014-10-13 ENCOUNTER — Ambulatory Visit: Payer: Medicare Other

## 2014-10-20 ENCOUNTER — Ambulatory Visit (INDEPENDENT_AMBULATORY_CARE_PROVIDER_SITE_OTHER): Payer: Medicare Other | Admitting: Podiatry

## 2014-10-20 DIAGNOSIS — B351 Tinea unguium: Secondary | ICD-10-CM | POA: Diagnosis not present

## 2014-10-20 DIAGNOSIS — M79676 Pain in unspecified toe(s): Secondary | ICD-10-CM

## 2014-10-20 NOTE — Progress Notes (Signed)
   Subjective:    Patient ID: Vickie Pham, female    DOB: 07/25/1936, 79 y.o.   MRN: [id number]  HPI  Pt presents for nail debridement  Review of Systems     Objective:   Physical Exam: I have reviewed her past medical history medications allergies surgeries social history and review of systems. Pulses are palpable bilateral. Nails are thick yellow dystrophic onychomycotic and painful palpation.        Assessment & Plan:  Assessment: Pain in limb secondary to onychomycosis 1 through 5 bilateral.  Plan: Debridement of nails 1 through 5 bilateral covered service secondary to pain. We had to resize for diabetic shoes and reorder. NO charges today for shoes.

## 2015-01-19 ENCOUNTER — Encounter (HOSPITAL_COMMUNITY): Payer: Self-pay | Admitting: Emergency Medicine

## 2015-01-19 ENCOUNTER — Emergency Department (HOSPITAL_COMMUNITY)
Admission: EM | Admit: 2015-01-19 | Discharge: 2015-01-20 | Disposition: A | Discharge status: Home / Self Care | Payer: Medicare Other | Attending: Emergency Medicine | Admitting: Emergency Medicine

## 2015-01-19 DIAGNOSIS — Z79899 Other long term (current) drug therapy: Secondary | ICD-10-CM | POA: Diagnosis not present

## 2015-01-19 DIAGNOSIS — M25511 Pain in right shoulder: Secondary | ICD-10-CM | POA: Diagnosis present

## 2015-01-19 DIAGNOSIS — M7581 Other shoulder lesions, right shoulder: Secondary | ICD-10-CM | POA: Insufficient documentation

## 2015-01-19 DIAGNOSIS — Z7901 Long term (current) use of anticoagulants: Secondary | ICD-10-CM | POA: Diagnosis not present

## 2015-01-19 DIAGNOSIS — I10 Essential (primary) hypertension: Secondary | ICD-10-CM | POA: Insufficient documentation

## 2015-01-19 DIAGNOSIS — E119 Type 2 diabetes mellitus without complications: Secondary | ICD-10-CM | POA: Insufficient documentation

## 2015-01-19 DIAGNOSIS — Z87891 Personal history of nicotine dependence: Secondary | ICD-10-CM | POA: Insufficient documentation

## 2015-01-19 DIAGNOSIS — Z86718 Personal history of other venous thrombosis and embolism: Secondary | ICD-10-CM | POA: Insufficient documentation

## 2015-01-19 DIAGNOSIS — M1990 Unspecified osteoarthritis, unspecified site: Secondary | ICD-10-CM | POA: Insufficient documentation

## 2015-01-19 NOTE — ED Notes (Signed)
Patient states she is having right shoulder pain radiates to wrist, onset Tuesday. Patient states she is now having pain in her right side. Patient pain started after a heavy cleaning day. Patient has not taken anything for pain.

## 2015-01-20 ENCOUNTER — Emergency Department (HOSPITAL_COMMUNITY): Payer: Medicare Other

## 2015-01-20 DIAGNOSIS — M7581 Other shoulder lesions, right shoulder: Secondary | ICD-10-CM | POA: Diagnosis not present

## 2015-01-20 MED ORDER — HYDROCODONE-ACETAMINOPHEN 5-325 MG PO TABS: 1.0000 | ORAL_TABLET | Freq: Four times a day (QID) | ORAL | Status: DC | PRN

## 2015-01-20 MED ORDER — HYDROCODONE-ACETAMINOPHEN 5-325 MG PO TABS
1.0000 | ORAL_TABLET | Freq: Once | ORAL | Status: AC
  Administered 2015-01-20: 1 via ORAL
  Filled 2015-01-20: qty 1

## 2015-01-20 MED ORDER — HYDROMORPHONE HCL 1 MG/ML IJ SOLN
1.0000 mg | Freq: Once | INTRAMUSCULAR | Status: AC
  Administered 2015-01-20: 1 mg via INTRAMUSCULAR
  Filled 2015-01-20: qty 1

## 2015-01-20 MED ORDER — DIAZEPAM 2 MG PO TABS
2.0000 mg | ORAL_TABLET | Freq: Once | ORAL | Status: AC
  Administered 2015-01-20: 2 mg via ORAL
  Filled 2015-01-20: qty 1

## 2015-01-20 MED ORDER — TIZANIDINE HCL 2 MG PO TABS: 2.0000 mg | ORAL_TABLET | Freq: Three times a day (TID) | ORAL | Status: DC

## 2015-01-20 NOTE — Discharge Instructions (Signed)
The Xray shows arthritis of the shoulder. Also rotator cuff injuries. Best to use RICE treatment (read below) and see the Orthopedist.   Rotator Cuff Tear The rotator cuff is four tendons that assist in the motion of the shoulder. A rotator cuff tear is a tear in one of these four tendons. It is characterized by pain and weakness of the shoulder. The rotator cuff tendons surround the shoulder ball and socket joint (humeral head). The rotator cuff tendons attach to the shoulder blade (scapula) on one side and the upper arm bone (humerus) on the other side. The rotator cuff is essential for shoulder stability and shoulder motion. SYMPTOMS   Pain around the shoulder, often at the outer portion of the upper arm.  Pain that is worse with shoulder function, especially when reaching overhead or lifting.  Weakness of the shoulder muscles.  Aching when not using your arm; often, pain awakens you at night, especially when sleeping on the affected side.  Tenderness, swelling, warmth, or redness over the outer aspect of the shoulder.  Loss of strength.  Limited motion of the shoulder, especially reaching behind (reaching into one's back pocket) or across your body.  A crackling sound (crepitation) when moving the shoulder.  Biceps tendon pain (in the front of the shoulder) and inflammation, worse with bending the elbow or lifting. CAUSES   Strain from sudden increase in amount or intensity of activity.  Direct blow or injury to the shoulder.  Aging, wear from from normal use.  Roof of the shoulder (acromial) spur. RISK INCREASES WITH:   Contact sports (football, wrestling, or boxing).  Throwing or hitting sports (baseball, tennis, or volleyball).  Weightlifting and bodybuilding.  Heavy labor.  Previous injury to rotator cuff.  Failure to warm up properly before activity.  Inadequate protective equipment.  Increasing age.  Spurring of the outer end of the scapula  (acromion).  Cortisone injections.  Poor shoulder strength and flexibility. PREVENTION  Warm up and stretch properly before activity.  Allow time for rest and recovery between practices and competition.  Maintain physical fitness:  Cardiovascular fitness.  Shoulder flexibility.  Strength and endurance of the rotator cuff muscles and muscles of the shoulder blade.  Learn and use proper technique when throwing or hitting. PROGNOSIS Surgery is often needed. Although, symptoms may go away by themselves. RELATED COMPLICATIONS   Persistent pain that may progress to constant pain.  Shoulder stiffness, frozen shoulder syndrome, or loss of motion.  Recurrence of symptoms, especially if treated without surgery.  Inability to return to same level of sports, even with surgery.  Persistent weakness.  Risks of surgery, including infection, bleeding, injury to nerves, shoulder stiffness, weakness, re-tearing of the rotator cuff tendon.  Deltoid detachment, acromial fracture, and persistent pain. TREATMENT Treatment involves the use of ice and medicine to reduce pain and inflammation. Strengthening and stretching exercise are usually recommended. These exercises may be completed at home or with a therapist. You may also be instructed to modify offending activities. Corticosteroid injections may be given to reduce inflammation. Surgery is usually recommended for athletes. Surgery has the best chance for a full recovery. Surgery involves:  Removal of an inflamed bursa.  Removal of an acromial spur if present.  Suturing the torn tendon back together. Rotator cuff surgeries may be preformed either arthroscopically or through an open incision. Recovery typically takes 6 to 12 months. MEDICATION  If pain medicine is necessary, then nonsteroidal anti-inflammatory medicines, such as aspirin and ibuprofen, or other minor pain  relievers, such as acetaminophen, are often recommended.  Do not  take pain medicine for 7 days before surgery.  Prescription pain relievers are usually only prescribed after surgery. Use only as directed and only as much as you need.  Corticosteroid injections may be given to reduce inflammation. However, there is a limited number of times the joint may be injected with these medicines. HEAT AND COLD  Cold treatment (icing) relieves pain and reduces inflammation. Cold treatment should be applied for 10 to 15 minutes every 2 to 3 hours for inflammation and pain and immediately after any activity that aggravates your symptoms. Use ice packs or massage the area with a piece of ice (ice massage).  Heat treatment may be used prior to performing the stretching and strengthening activities prescribed by your caregiver, physical therapist, or athletic trainer. Use a heat pack or soak the injury in warm water. SEEK MEDICAL CARE IF:   Symptoms get worse or do not improve in 4 to 6 weeks despite treatment.  You experience pain, numbness, or coldness in the hand.  Blue, gray, or dark color appears in the fingernails.  New, unexplained symptoms develop (drugs used in treatment may produce side effects). Document Released: 08/12/2005 Document Revised: 11/04/2011 Document Reviewed: 11/24/2008 [facility] Patient Information 2015 [place], [place]. This information is not intended to replace advice given to you by your health care provider. Make sure you discuss any questions you have with your health care provider. RICE: Routine Care for Injuries The routine care of many injuries includes Rest, Ice, Compression, and Elevation (RICE). HOME CARE INSTRUCTIONS  Rest is needed to allow your body to heal. Routine activities can usually be resumed when comfortable. Injured tendons and bones can take up to 6 weeks to heal. Tendons are the cord-like structures that attach muscle to bone.  Ice following an injury helps keep the swelling down and reduces pain.  Put ice in a plastic  bag.  Place a towel between your skin and the bag.  Leave the ice on for 15-20 minutes, 3-4 times a day, or as directed by your health care provider. Do this while awake, for the first 24 to 48 hours. After that, continue as directed by your caregiver.  Compression helps keep swelling down. It also gives support and helps with discomfort. If an elastic bandage has been applied, it should be removed and reapplied every 3 to 4 hours. It should not be applied tightly, but firmly enough to keep swelling down. Watch fingers or toes for swelling, bluish discoloration, coldness, numbness, or excessive pain. If any of these problems occur, remove the bandage and reapply loosely. Contact your caregiver if these problems continue.  Elevation helps reduce swelling and decreases pain. With extremities, such as the arms, hands, legs, and feet, the injured area should be placed near or above the level of the heart, if possible. SEEK IMMEDIATE MEDICAL CARE IF:  You have persistent pain and swelling.  You develop redness, numbness, or unexpected weakness.  Your symptoms are getting worse rather than improving after several days. These symptoms may indicate that further evaluation or further X-rays are needed. Sometimes, X-rays may not show a small broken bone (fracture) until 1 week or 10 days later. Make a follow-up appointment with your caregiver. Ask when your X-ray results will be ready. Make sure you get your X-ray results. Document Released: 11/24/2000 Document Revised: 08/17/2013 Document Reviewed: 01/11/2011 [facility] Patient Information 2015 [place], [place]. This information is not intended to replace advice given to  you by your health care provider. Make sure you discuss any questions you have with your health care provider.

## 2015-01-20 NOTE — ED Provider Notes (Signed)
CSN: [id number]     Arrival date & time 01/19/15  2227 History   First MD Initiated Contact with Patient 01/20/15 0103     Chief Complaint  Patient presents with  . Shoulder Pain    right, radiates to wrist     (Consider location/radiation/quality/duration/timing/severity/associated sxs/prior Treatment) HPI Comments: PT comes in with cc of R shoulder pain. She has no known arthritis. Reports that she did some heavy cleaning over the weekend and again on Tuesday - and starting Tuesday night she has been having significant pain. Pain is on the R side, shoulder, scapular region, and radiates down her forearm. No neck pain. Pain is worse with any movement of the shoulder, and her home tramadol is not helping. No hx of similar symptoms in the past. No hx of DVT. No n/v/f/c.  Patient is a 79 y.o. female presenting with shoulder pain. The history is provided by the patient.  Shoulder Pain Associated symptoms: no neck pain     Past Medical History  Diagnosis Date  . Hypertension   . Diabetes mellitus without complication   . Lymphedema of lower extremity   . DVT (deep venous thrombosis)   . Arthritis    Past Surgical History  Procedure Laterality Date  . Abdominal hysterectomy    . Cholecystectomy     History reviewed. No pertinent family history. History  Substance Use Topics  . Smoking status: Former Smoker    Types: Cigarettes  . Smokeless tobacco: Not on file  . Alcohol Use: No   OB History    No data available     Review of Systems  Constitutional: Positive for activity change.  Musculoskeletal: Positive for myalgias and arthralgias. Negative for neck pain and neck stiffness.  Skin: Negative for rash.  Neurological: Negative for numbness.      Allergies  Review of patient's allergies indicates no known allergies.  Home Medications   Prior to Admission medications   Medication Sig Start Date End Date Taking? Authorizing Provider  amLODipine (NORVASC) 10 MG  tablet Take 10 mg by mouth daily.   Yes Historical Provider, MD  atorvastatin (LIPITOR) 20 MG tablet Take 20 mg by mouth at bedtime.   Yes Historical Provider, MD  furosemide (LASIX) 40 MG tablet Take 40 mg by mouth 2 (two) times daily.   Yes Historical Provider, MD  glipiZIDE (GLUCOTROL) 5 MG tablet Take 5 mg by mouth 2 (two) times daily before a meal.   Yes Historical Provider, MD  hydrALAZINE (APRESOLINE) 10 MG tablet Take 25 mg by mouth 3 (three) times daily.    Yes Historical Provider, MD  metFORMIN (GLUCOPHAGE) 500 MG tablet Take 1 tablet by mouth 3 (three) times daily. 11/14/14  Yes Historical Provider, MD  potassium chloride SA (K-DUR,KLOR-CON) 20 MEQ tablet Take 20 mEq by mouth daily.   Yes Historical Provider, MD  traMADol (ULTRAM) 50 MG tablet Take 50 mg by mouth 3 (three) times daily as needed for pain.   Yes Historical Provider, MD  warfarin (COUMADIN) 5 MG tablet Take 10 mg by mouth daily.   Yes Historical Provider, MD  glyBURIDE-metformin (GLUCOVANCE) 5-500 MG per tablet TAKE TWO TABLETS BY MOUTH TWICE DAILY Patient not taking: Reported on 01/20/2015 08/29/11   Beatrice LecherScott T Weaver, PA-C  HYDROcodone-acetaminophen (NORCO/VICODIN) 5-325 MG per tablet Take 1 tablet by mouth every 6 (six) hours as needed. 01/20/15   Derwood KaplanAnkit Ernie Kasler, MD  terazosin (HYTRIN) 2 MG capsule TAKE THREE CAPSULES BY MOUTH EVERY DAY AT BEDTIME Patient  not taking: Reported on 01/20/2015 08/29/11   Beatrice Lecher, PA-C  tiZANidine (ZANAFLEX) 2 MG tablet Take 1 tablet (2 mg total) by mouth 3 (three) times daily. 01/20/15   Shaquna Geigle Rhunette Croft, MD   BP 171/83 mmHg  Pulse 52  Temp(Src) 97.6 F (36.4 C) (Oral)  Resp 18  Ht 5' 10.5" (1.791 m)  Wt 257 lb (116.574 kg)  BMI 36.34 kg/m2  SpO2 99% Physical Exam  Constitutional: She appears well-developed.  Eyes: Conjunctivae are normal.  Neck: Neck supple.  No midline c-spine tenderness, pt able to turn head to 45 degrees bilaterally without any pain and able to flex neck to the chest  and extend without any pain or neurologic symptoms.   Cardiovascular: Normal rate.   Pulmonary/Chest: Effort normal.  Musculoskeletal:  Tenderness over the R shoulder, scapular region, AC and GH joint with palpation. Pt has tenderness with simple passive ROM of the shoulder - particularly the abduction to the level of the shoulder plane and also internal and external rotation of the shoulder. There is no unilateral swelling, rubor or callor in the region.  Skin: Skin is warm.  Nursing note and vitals reviewed.   ED Course  Procedures (including critical care time) Labs Review Labs Reviewed - No data to display  Imaging Review Dg Shoulder Right  01/20/2015   CLINICAL DATA:  No known injury.  Right shoulder pain for 1 week.  EXAM: RIGHT SHOULDER - 2+ VIEW  COMPARISON:  None.  FINDINGS: There is no acute fracture, dislocation, or bone erosion. No evidence of aggressive bone lesion.  Subacromial space narrowing, likely from chronic rotator cuff tear. There is chronic appearing fragmentation of the acromion with acromioclavicular osteoarthritic narrowing and spurring.  No soft tissue ossification.  IMPRESSION: 1. No acute findings. 2. Subacromial narrowing compatible with chronic rotator cuff tear. 3. Acromioclavicular osteoarthritis.   Electronically Signed   By: Marnee Spring M.D.   On: 01/20/2015 02:34     EKG Interpretation None      MDM   Final diagnoses:  Rotator cuff tendinitis, right    Pt has unilateral shoulder pain, R side. Symptoms preceded by heavy cleaning. She is diabetic, but there are no signs of infection, and clinically appears to be related to OA or rotator cuff tendinitis. Will d.c with ortho f/u.   Derwood Kaplan, MD 01/20/15 0330

## 2015-01-26 ENCOUNTER — Encounter: Payer: Self-pay | Admitting: Podiatry

## 2015-01-26 ENCOUNTER — Ambulatory Visit (INDEPENDENT_AMBULATORY_CARE_PROVIDER_SITE_OTHER): Payer: Medicare Other | Admitting: Podiatry

## 2015-01-26 DIAGNOSIS — M2042 Other hammer toe(s) (acquired), left foot: Secondary | ICD-10-CM | POA: Diagnosis not present

## 2015-01-26 DIAGNOSIS — M2041 Other hammer toe(s) (acquired), right foot: Secondary | ICD-10-CM | POA: Diagnosis not present

## 2015-01-26 DIAGNOSIS — B351 Tinea unguium: Secondary | ICD-10-CM | POA: Diagnosis not present

## 2015-01-26 DIAGNOSIS — E1140 Type 2 diabetes mellitus with diabetic neuropathy, unspecified: Secondary | ICD-10-CM

## 2015-01-26 DIAGNOSIS — M2012 Hallux valgus (acquired), left foot: Secondary | ICD-10-CM

## 2015-01-26 DIAGNOSIS — M2011 Hallux valgus (acquired), right foot: Secondary | ICD-10-CM

## 2015-01-26 DIAGNOSIS — M79676 Pain in unspecified toe(s): Secondary | ICD-10-CM | POA: Diagnosis not present

## 2015-01-26 NOTE — Patient Instructions (Signed)

## 2015-01-26 NOTE — Progress Notes (Signed)
Subjective:     Patient ID: Vickie Pham, female   DOB: 08/17/1936, 79 y.o.   MRN: [id number]  HPI  She presents to the office for pereventive footcare services.  She says the nails have become painful walking and wearing her shoes.  She is also diabetic and is picking up her diabetic shoes this visit.   Review of Systems     Objective:   Physical ExamDorsalis pedis and posterior tibial pulses are palpable.  CR and TG WNL.  Thick disfigured discolored nails with subungual debris x 10.  No infections or paronychia noted.      Assessment:     Onychomycosis.   2. Pain due to nails   3.  Diabetes Mellitus     Plan:     Debridement of Mycotic nails.  Dispense diabetic shoes

## 2015-04-11 ENCOUNTER — Telehealth: Payer: Self-pay | Admitting: *Deleted

## 2015-04-11 NOTE — Telephone Encounter (Signed)
Pt states she received a call stating she has an appt in different office, with a different doctor and I have no phone number or address.

## 2015-04-13 ENCOUNTER — Ambulatory Visit (INDEPENDENT_AMBULATORY_CARE_PROVIDER_SITE_OTHER): Payer: Medicare Other | Admitting: Podiatry

## 2015-04-13 DIAGNOSIS — B351 Tinea unguium: Secondary | ICD-10-CM

## 2015-04-13 DIAGNOSIS — M79676 Pain in unspecified toe(s): Secondary | ICD-10-CM

## 2015-04-13 DIAGNOSIS — E1140 Type 2 diabetes mellitus with diabetic neuropathy, unspecified: Secondary | ICD-10-CM

## 2015-04-13 NOTE — Progress Notes (Signed)
Patient ID: Vickie Pham, female   DOB: [birth year], 79 y.o.   MRN: [id number] Complaint:  Visit Type: Patient returns to my office for continued preventative foot care services. Complaint: Patient states" my nails have grown long and thick and become painful to walk and wear shoes" Patient has been diagnosed with DM with neurological complications.. The patient presents for preventative foot care services. No changes to ROS  Podiatric Exam: Vascular: dorsalis pedis and posterior tibial pulses are not  palpable bilateral due to severe foot and ankle swelling.. Capillary return is immediate. Temperature gradient is WNL. Skin turgor WNL  Sensorium: Diminished Semmes Weinstein monofilament test. Normal tactile sensation bilaterally. Nail Exam: Pt has thick disfigured discolored nails with subungual debris noted bilateral entire nail hallux through fifth toenails Ulcer Exam: There is no evidence of ulcer or pre-ulcerative changes or infection. Orthopedic Exam: Muscle tone and strength are WNL. No limitations in general ROM. No crepitus or effusions noted. Foot type and digits show no abnormalities. Bony prominences are unremarkable. Skin: No Porokeratosis. No infection or ulcers  Diagnosis:  Onychomycosis, , Pain in right toe, pain in left toes  Treatment & Plan Procedures and Treatment: Consent by patient was obtained for treatment procedures. The patient understood the discussion of treatment and procedures well. All questions were answered thoroughly reviewed. Debridement of mycotic and hypertrophic toenails, 1 through 5 bilateral and clearing of subungual debris. No ulceration, no infection noted.  Return Visit-Office Procedure: Patient instructed to return to the office for a follow up visit 3 months for continued evaluation and treatment.

## 2015-08-04 ENCOUNTER — Other Ambulatory Visit: Payer: Self-pay

## 2015-08-04 DIAGNOSIS — Z1231 Encounter for screening mammogram for malignant neoplasm of breast: Secondary | ICD-10-CM

## 2015-09-08 ENCOUNTER — Ambulatory Visit
Admission: RE | Admit: 2015-09-08 | Discharge: 2015-09-08 | Disposition: A | Discharge status: Home / Self Care | Payer: Medicare Other | Source: Ambulatory Visit

## 2015-09-08 DIAGNOSIS — Z1231 Encounter for screening mammogram for malignant neoplasm of breast: Secondary | ICD-10-CM

## 2016-10-21 ENCOUNTER — Other Ambulatory Visit: Payer: Self-pay | Admitting: Internal Medicine

## 2016-10-21 DIAGNOSIS — Z1231 Encounter for screening mammogram for malignant neoplasm of breast: Secondary | ICD-10-CM

## 2016-11-05 ENCOUNTER — Ambulatory Visit: Payer: Medicare Other

## 2016-11-18 ENCOUNTER — Ambulatory Visit
Admission: RE | Admit: 2016-11-18 | Discharge: 2016-11-18 | Disposition: A | Discharge status: Home / Self Care | Payer: Medicare Other | Source: Ambulatory Visit | Attending: Internal Medicine | Admitting: Internal Medicine

## 2016-11-18 DIAGNOSIS — Z1231 Encounter for screening mammogram for malignant neoplasm of breast: Secondary | ICD-10-CM

## 2016-12-16 ENCOUNTER — Emergency Department (HOSPITAL_COMMUNITY)
Admission: EM | Admit: 2016-12-16 | Discharge: 2016-12-16 | Disposition: A | Discharge status: Home / Self Care | Payer: Medicare Other | Attending: Emergency Medicine | Admitting: Emergency Medicine

## 2016-12-16 ENCOUNTER — Encounter (HOSPITAL_COMMUNITY): Payer: Self-pay | Admitting: Emergency Medicine

## 2016-12-16 ENCOUNTER — Emergency Department (HOSPITAL_COMMUNITY): Payer: Medicare Other

## 2016-12-16 DIAGNOSIS — Z87891 Personal history of nicotine dependence: Secondary | ICD-10-CM | POA: Diagnosis not present

## 2016-12-16 DIAGNOSIS — I10 Essential (primary) hypertension: Secondary | ICD-10-CM | POA: Diagnosis not present

## 2016-12-16 DIAGNOSIS — Z7984 Long term (current) use of oral hypoglycemic drugs: Secondary | ICD-10-CM | POA: Insufficient documentation

## 2016-12-16 DIAGNOSIS — M25552 Pain in left hip: Secondary | ICD-10-CM | POA: Diagnosis present

## 2016-12-16 DIAGNOSIS — M1610 Unilateral primary osteoarthritis, unspecified hip: Secondary | ICD-10-CM

## 2016-12-16 DIAGNOSIS — E119 Type 2 diabetes mellitus without complications: Secondary | ICD-10-CM | POA: Insufficient documentation

## 2016-12-16 DIAGNOSIS — M1612 Unilateral primary osteoarthritis, left hip: Secondary | ICD-10-CM | POA: Insufficient documentation

## 2016-12-16 DIAGNOSIS — Z79899 Other long term (current) drug therapy: Secondary | ICD-10-CM | POA: Diagnosis not present

## 2016-12-16 DIAGNOSIS — Z7901 Long term (current) use of anticoagulants: Secondary | ICD-10-CM | POA: Insufficient documentation

## 2016-12-16 DIAGNOSIS — G8929 Other chronic pain: Secondary | ICD-10-CM | POA: Diagnosis not present

## 2016-12-16 MED ORDER — MELOXICAM 7.5 MG PO TABS
7.5000 mg | ORAL_TABLET | Freq: Every day | ORAL | Status: DC
  Administered 2016-12-16: 7.5 mg via ORAL
  Filled 2016-12-16 (×2): qty 1

## 2016-12-16 MED ORDER — TRAMADOL HCL 50 MG PO TABS
50.0000 mg | ORAL_TABLET | Freq: Once | ORAL | Status: AC
  Administered 2016-12-16: 50 mg via ORAL
  Filled 2016-12-16: qty 1

## 2016-12-16 MED ORDER — MELOXICAM 7.5 MG PO TABS: 7.5000 mg | ORAL_TABLET | Freq: Every day | ORAL | 0 refills | Status: DC

## 2016-12-16 NOTE — ED Triage Notes (Signed)
Patient reports left hip pain x3 years; pain has become progressively worse over the past week. Denies recent injury/trauma/fall.

## 2016-12-16 NOTE — Discharge Instructions (Signed)
Continue to use your home scooter, cane, and/or walker as needed for comfort to help with walking. Use heat to the area of pain throughout the day, using a heat pack for no more than 20 minutes every hour. Use your home tramadol as needed for pain. Do not drive or operate machinery with pain medication use. Also start taking mobic as directed to help with arthritis related pain. STOP TAKING MOBIC IMMEDIATELY IF YOU DEVELOP RECTAL BLEEDING, DARK TARRY STOOLS, GUM BLEEDING, OR ANY OTHER BLEEDING ELSEWHERE! If this occurs, call your regular doctor immediately, and stop taking the mobic. Call orthopedic follow up today or tomorrow to schedule followup appointment in 1-2 weeks for ongoing management of your hip pain and arthritis. Return to the ER for changes or worsening symptoms.
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
## 2017-01-03 ENCOUNTER — Ambulatory Visit: Payer: Medicare Other | Admitting: Physical Therapy

## 2017-01-08 ENCOUNTER — Ambulatory Visit: Payer: Medicare Other | Admitting: Physical Therapy

## 2017-01-09 ENCOUNTER — Ambulatory Visit: Payer: Medicare Other | Attending: Orthopedic Surgery | Admitting: Physical Therapy

## 2017-01-09 DIAGNOSIS — M6281 Muscle weakness (generalized): Secondary | ICD-10-CM | POA: Diagnosis present

## 2017-01-09 DIAGNOSIS — I890 Lymphedema, not elsewhere classified: Secondary | ICD-10-CM | POA: Insufficient documentation

## 2017-01-09 NOTE — Therapy (Signed)
[facility] [address] [place], [place], [address] Phone: [phone]   Fax:  [phone]  Physical Therapy Evaluation  Patient Details  Name: Vickie Pham MRN: [id number] Date of Birth: 1936-07-19 Referring Provider: Dr. Linna Caprice  Encounter Date: 01/09/2017      PT End of Session - 01/09/17 1756    Visit Number 1   Number of Visits 19   Date for PT Re-Evaluation 02/25/17   PT Start Time 1350   PT Stop Time 1430   PT Time Calculation (min) 40 min   Activity Tolerance Patient tolerated treatment well   Behavior During Therapy [facility] for tasks assessed/performed      Past Medical History:  Diagnosis Date  . Arthritis   . Diabetes mellitus without complication (HCC)   . DVT (deep venous thrombosis) (HCC)   . Hypertension   . Lymphedema of lower extremity     Past Surgical History:  Procedure Laterality Date  . ABDOMINAL HYSTERECTOMY    . CHOLECYSTECTOMY      There were no vitals filed for this visit.       Subjective Assessment - 01/09/17 1400    Subjective Pt plans to have a left total hip replacement "I don't have any choice"   She had treatment for lymphedema in years past and had bandaging with good results but had difficulty taking the stocking off so she stopped wearing them. She now has problems getting stockings on or off  because of limited motion in her hip  due to her hip arthritis.    Pertinent History pt needs a left total hip replacement and has had more difficulty in  past few weeks. 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  She has had lymphedema in both lower extremties since 2011 with history of blood clots.  She needs to have reduction of LE lymphedema prior to hip surgery.    Currently in Pain? Yes   Pain Score --  did not rate    Pain Location Hip   Pain Orientation Left   Pain Descriptors / Indicators Aching   Pain Type Chronic pain   Pain Onset More than a month ago    Pain Frequency Constant   Aggravating Factors  walking on her left hip    Effect of Pain on Daily Activities limits all mobility             [facility] PT Assessment - 01/09/17 0001      Assessment   Medical Diagnosis hip arthritis and lymphedema    Referring Provider Dr. Linna Caprice   Onset Date/Surgical Date 08/26/09  approximate date    Prior Therapy yes in 2011 in [facility]      Precautions   Precautions Fall     Restrictions   Weight Bearing Restrictions No  limited by pain in left hip      Balance Screen   Has the patient fallen in the past 6 months No   Has the patient had a decrease in activity level because of a fear of falling?  No   Is the patient reluctant to leave their home because of a fear of falling?  No     Home Environment   Living Environment Private residence   Living Arrangements Alone   Available Help at Discharge Personal care attendant  M-W-F for 2 hours each time    Type of Home Apartment     Prior Function   Level of Independence Requires assistive  device for independence;Needs assistance with ADLs;Needs assistance with transfers   Vocation Retired   Leisure social activities with friends, does not regularly exercise      Cognition   Overall Cognitive Status Within Functional Limits for tasks assessed     Observation/Other Assessments   Observations pt comes in walking with rollator.  She has regular socks that are not tight at the top with velcro slides for shoes She has visible lymphedema in feet, especailly at ankles, and at lower legs.  Her skin looks good with no open areas    Skin Integrity intact    Other Surveys  --  lymphedema Lift impact Scale 41 or 60% impaired      Observation/Other Assessments-Edema    Edema Circumferential     Circumferential Edema   Circumferential - Right yes in both lower legs    Circumferential - Left  yes in both lower legs      Sensation   Additional Comments decreased in left leg from neuropathy       Coordination   Gross Motor Movements are Fluid and Coordinated Yes  slow     Posture/Postural Control   Posture/Postural Control Postural limitations   Postural Limitations Rounded Shoulders;Forward head   Posture Comments pt is able to stand erect without holding onto walker      ROM / Strength   AROM / PROM / Strength AROM     AROM   Overall AROM  Deficits   Overall AROM Comments limited in left hip, not formally tested      Strength   Overall Strength Comments appears to have generalized deconditioning      Transfers   Transfers Sit to Stand;Stand to Sit   Sit to Stand 6: Modified independent (Device/Increase time)  pushes up with arms            LYMPHEDEMA/ONCOLOGY QUESTIONNAIRE - 01/09/17 1755      What other symptoms do you have   Are you Having Heaviness or Tightness Yes   Are you having Pain Yes   Are you having pitting edema Yes   Body Site both lower legs, ankles and feet    Is it Hard or Difficult finding clothes that fit Yes   Do you have infections No   Stemmer Sign Yes     Lymphedema Stage   Stage STAGE 2 SPONTANEOUSLY IRREVERSIBLE     Right Lower Extremity Lymphedema   30 cm Proximal to Floor at Lateral Plantar Foot 36.8 cm   20 cm Proximal to Floor at Lateral Plantar Foot 35.5 1   10  cm Proximal to Floor at Lateral Malleoli 33 cm   5 cm Proximal to 1st MTP Joint 25 cm   Around Proximal Great Toe 9.3 cm     Left Lower Extremity Lymphedema   30 cm Proximal to Floor at Lateral Plantar Foot 38 cm   20 cm Proximal to Floor at Lateral Plantar Foot 35 cm   10 cm Proximal to Floor at Lateral Malleoli 33.5 cm   5 cm Proximal to 1st MTP Joint 29.5 cm   Around Proximal Great Toe 9.3 cm                                Long Term Clinic Goals - 01/09/17 1812      CC Long Term Goal  #1   Title Pt will have decrease in left lower extremity circumference at  10 cm proximal to the floor to 30 cm    Baseline 33.5 cm    Time 6    Period Weeks   Status New     CC Long Term Goal  #2   Title Pt will have decrease in right lower extremity circumference at 10 cm proximal to the floor to 30 cm    Baseline 33 cm    Time 6   Period Weeks   Status New     CC Long Term Goal  #3   Title Pt will have ordered compression garments for maintenance of reduction of bilateral leg lymphedema    Time 6   Period Weeks   Status New     CC Long Term Goal  #4   Title Pt will be independent in home exercise for general strength    Time 6   Period Weeks   Status New            Plan - 02-03-2017 1757    Clinical Impression Statement Very pleasant 81 yo with bilateral lower extremity lymphedema who needs to have a left hip replacement. Past history includes diabetes and multiple blood clots. She lives alone but has a caregiver 2 hours a day 3 days a week.  She had good success with complete decongestive therapy in the past, but had diffiicutly following through with compression garments. She has pitting edema so feel she will improve with complete decongestive therapy. She has agreed to treatment including manual lymph drainage, compression bandaging and exercise with velcro garments for maintenance that should be easier for her and her caregiver to follow through with.  The Medicaid certified fitter comes from [facility] intermittently, so will forward demographics to get scheduled for garment fitting.  Garments may take several weeks to get here and pt hopes to have suregery in June.  I feel she should have good reduction by then and hopeful to have the garments for maintenance of reduction after surgery.  She will also benefit from UE and LE exercise to prepare for surgery. Because of multiple comorbidites and evolving nature of her condition this is a moderately complex eval.    Rehab Potential Good   Clinical Impairments Affecting Rehab Potential left hip arthritis with decreased range of motion  requiring THR   PT Frequency 3x / week    PT Duration 6 weeks   PT Treatment/Interventions ADLs/Self Care Home Management;Patient/family education;Orthotic Fit/Training;DME Instruction;Manual techniques;Manual lymph drainage;Compression bandaging;Therapeutic exercise;Therapeutic activities   PT Next Visit Plan begin complete decongestive therapy starting with left leg, and if tolerated progress to right leg. Instruct in elevation and exercise.  Add supine scapular series for UE strenthe with glute and quad sets. repetitve sit to stand to prehab for THR    Consulted and Agree with Plan of Care Patient      Patient will benefit from skilled therapeutic intervention in order to improve the following deficits and impairments:  Obesity, Increased edema, Decreased knowledge of precautions, Decreased knowledge of use of DME, Decreased strength, Pain, Difficulty walking, Postural dysfunction  Visit Diagnosis: Lymphedema, not elsewhere classified  Muscle weakness (generalized)      G-Codes - 03-Feb-2017 1442    Functional Assessment Tool Used (Outpatient Only) Lymphedema Life Impact Scale 41 or 60% impaired    Functional Limitation Self care   Self Care Current Status (N8295) At least 60 percent but less than 80 percent impaired, limited or restricted   Self Care Goal Status (A2130) At least 40 percent but  less than 60 percent impaired, limited or restricted       Problem List Patient Active Problem List   Diagnosis Date Noted  . DYSPNEA 04/04/2010  . DIABETIC  RETINOPATHY 02/02/2010  . ANEMIA 12/28/2009  . MICROALBUMINURIA 12/08/2009  . CERUMEN IMPACTION 12/07/2009  . VENTRAL HERNIA WITH INCARCERATION 11/08/2009  . VITAMIN D DEFICIENCY 09/01/2009  . PERIUMBILICAL HERNIA 09/01/2009  . KNEE PAIN 09/01/2009  . URINALYSIS, ABNORMAL 09/01/2009  . HEART MURMUR, SYSTOLIC 02/28/2009  . VIRAL INFECTION 08/16/2008  . LEG PAIN, BILATERAL 05/25/2008  . UNSPECIFIED VENOUS INSUFFICIENCY 09/09/2007  . DIABETES MELLITUS, TYPE II 04/10/2007   . DIABETES MELLITUS, TYPE II, WITH NEUROLOGICAL COMPLICATIONS 04/10/2007  . DYSLIPIDEMIA 04/10/2007  . RETINOPATHY, DIABETIC, BACKGROUND 04/10/2007  . HYPERTENSION 04/10/2007  . EDEMA 04/10/2007  . DVT, HX OF 04/10/2007  . GASTRIC POLYP, HX OF 04/10/2007  . HERNIORRHAPHY, HX OF 04/10/2007  . GI BLEEDING 07/25/2006   Bayard Huggereresa K. Manson PasseyBrown, PT  Donnetta HailBrown, Demari Gales Krall 01/09/2017, 6:16 PM  [facility] [address] [place], [place], [id number] Phone: [phone]   Fax:  [phone]  Name: [name] MRN: [id number] Date of Birth: 01/16/1936

## 2017-01-13 ENCOUNTER — Ambulatory Visit: Payer: Medicare Other

## 2017-01-13 DIAGNOSIS — M6281 Muscle weakness (generalized): Secondary | ICD-10-CM

## 2017-01-13 DIAGNOSIS — I890 Lymphedema, not elsewhere classified: Secondary | ICD-10-CM

## 2017-01-13 NOTE — Therapy (Signed)
[facility] [address] [place], [place], [address] Phone: [phone]   Fax:  [phone]  Physical Therapy Treatment  Patient Details  Name: Vickie Pham MRN: [id number] Date of Birth: 05/20/1936 Referring Provider: Dr. Linna CapriceSwinteck  Encounter Date: 01/13/2017      PT End of Session - 01/13/17 1102    Visit Number 2   Number of Visits 19   Date for PT Re-Evaluation 02/25/17   PT Start Time 0940   PT Stop Time 1104   PT Time Calculation (min) 84 min   Activity Tolerance Patient tolerated treatment well   Behavior [facility] tasks assessed/performed      Past Medical History:  Diagnosis Date  . Arthritis   . Diabetes mellitus without complication (HCC)   . DVT (deep venous thrombosis) (HCC)   . Hypertension   . Lymphedema of lower extremity     Past Surgical History:  Procedure Laterality Date  . ABDOMINAL HYSTERECTOMY    . CHOLECYSTECTOMY      There were no vitals filed for this visit.                       [facility] - 01/13/17 0001      Manual Therapy   Manual Therapy Manual Lymphatic Drainage (MLD);Compression Bandaging   Manual Lymphatic Drainage (MLD) In Supine: Short neck, superifical and deep abdominals, Lt axillary nodes, Lt inguino-axillary anastomosis, then Lt LE from dorsal foot to lateral thigh working from proximal to distal then retracing all steps; then same on Rt.    Compression Bandaging To Lt LE: Biotone lotion, thick stockinette, elastomull to first 4 toes, Artiflex x1, then 1-6, 1-8, 2-12 cm short stretch bandages from foot to knee.                 PT Education - 01/13/17 1210    Education provided Yes   Education Details Remedial exercises and self manual lymph drainage   Person(s) Educated Patient   Methods Explanation;Demonstration;Handout  handout issued for MLD   Comprehension Verbalized understanding;Returned  demonstration;Need further instruction                Long Term Clinic Goals - 01/09/17 1812      CC Long Term Goal  #1   Title Pt will have decrease in left lower extremity circumference at 10 cm proximal to the floor to 30 cm    Baseline 33.5 cm    Time 6   Period Weeks   Status New     CC Long Term Goal  #2   Title Pt will have decrease in right lower extremity circumference at 10 cm proximal to the floor to 30 cm    Baseline 33 cm    Time 6   Period Weeks   Status New     CC Long Term Goal  #3   Title Pt will have ordered compression garments for maintenance of reduction of bilateral leg lymphedema    Time 6   Period Weeks   Status New     CC Long Term Goal  #4   Title Pt will be independent in home exercise for general strength    Time 6   Period Weeks   Status New            Plan - 01/13/17 1205    Clinical Impression Statement Pt tolerated first session of manual lymph drainage with education on basics  of lymphatic system very well. Well engaged throughout asking appropriate questions and being able to verbalize understanding. She has an aide who she says is already massaging her feet and legs for her but she has been doing it much harder than she was instructed in today so issued handout to pt for self manual lymph drainage and instructed pt how to show her aide correct pressure on her hand first to allow her to understand the correct light pressure and to stretch skin, never slide. Pt also reported bandaging feeling good at end of session as well.    Rehab Potential Good   Clinical Impairments Affecting Rehab Potential left hip arthritis with decreased range of motion  requiring THR   PT Frequency 3x / week   PT Duration 6 weeks   PT Treatment/Interventions ADLs/Self Care Home Management;Patient/family education;Orthotic Fit/Training;DME Instruction;Manual techniques;Manual lymph drainage;Compression bandaging;Therapeutic exercise;Therapeutic activities    PT Next Visit Plan Cont complete decongestive therapy starting with left leg, and if tolerated progress to right leg. Instruct in elevation and exercise.  Add supine scapular series for UE strenthening with glute and quad sets. repetitve sit to stand to prehab for THR    Consulted and Agree with Plan of Care Patient      Patient will benefit from skilled therapeutic intervention in order to improve the following deficits and impairments:  Obesity, Increased edema, Decreased knowledge of precautions, Decreased knowledge of use of DME, Decreased strength, Pain, Difficulty walking, Postural dysfunction  Visit Diagnosis: Lymphedema, not elsewhere classified  Muscle weakness (generalized)     Problem List Patient Active Problem List   Diagnosis Date Noted  . DYSPNEA 04/04/2010  . DIABETIC  RETINOPATHY 02/02/2010  . ANEMIA 12/28/2009  . MICROALBUMINURIA 12/08/2009  . CERUMEN IMPACTION 12/07/2009  . VENTRAL HERNIA WITH INCARCERATION 11/08/2009  . VITAMIN D DEFICIENCY 09/01/2009  . PERIUMBILICAL HERNIA 09/01/2009  . KNEE PAIN 09/01/2009  . URINALYSIS, ABNORMAL 09/01/2009  . HEART MURMUR, SYSTOLIC 02/28/2009  . VIRAL INFECTION 08/16/2008  . LEG PAIN, BILATERAL 05/25/2008  . UNSPECIFIED VENOUS INSUFFICIENCY 09/09/2007  . DIABETES MELLITUS, TYPE II 04/10/2007  . DIABETES MELLITUS, TYPE II, WITH NEUROLOGICAL COMPLICATIONS 04/10/2007  . DYSLIPIDEMIA 04/10/2007  . RETINOPATHY, DIABETIC, BACKGROUND 04/10/2007  . HYPERTENSION 04/10/2007  . EDEMA 04/10/2007  . DVT, HX OF 04/10/2007  . GASTRIC POLYP, HX OF 04/10/2007  . HERNIORRHAPHY, HX OF 04/10/2007  . GI BLEEDING 07/25/2006    Vickie Pham, PTA 01/13/2017, 12:11 PM  [facility] [address] [place], [place], [address] Phone: [phone]   Fax:  [phone]  Name: Vickie Pham MRN: [id number] Date of Birth: 1936/02/08

## 2017-01-13 NOTE — Patient Instructions (Signed)
  Start with "hugging yourself" by doing circles near neck above collarbones 10 times.   Deep Effective Breath   Standing, sitting, or laying down place both hands on the belly. Take a deep breath IN, expanding the belly; then breath OUT, contracting the belly. Repeat __5__ times. Do __2-3__ sessions per day and before each self massage.  http://gt2.exer.us/866   Copyright  VHI. All rights reserved.  Inguinal Nodes to Axilla - Clear   On involved side, at armpit, make _5__ in-place circles. Then from hip proceed in sections to armpit with pumps _5_ times, this is your pathway. Do _1__ time per day.  Copyright  VHI. All rights reserved.  LEG: Knee to Hip - Clear   Pump up outer thigh of involved leg from knee to outer hip. Then do stationary circles from inner to outer thigh, then do outer thigh again. Next, interlace fingers behind knee IF ABLE and make in-place circles. Do _5_ times of each sequence. Retrace steps to pathway. Do _1__ time per day.  Copyright  VHI. All rights reserved.  LEG: Ankle to Hip Sweep   Hands on sides of ankle of involved leg, pump _5__ times up both sides of lower leg, then retrace steps up outer thigh to hip as before and back to pathway. Do _2-3_ times. Do __1_ time per day.  Copyright  VHI. All rights reserved.  FOOT: Dorsum of Foot and Toes Massage   One hand on top of foot make _5_ stationary circles or pumps, then either on top of toes or each individual toe do _5_ pumps. Then retrace all steps pumping back up both sides of lower leg, outer thigh, and then pathway. Finish with what you started with, _5_ circles at involved side arm pit. All _2-3_ times at each sequence. Do _1__ time per day.  Copyright  VHI. All rights reserved.

## 2017-01-15 ENCOUNTER — Ambulatory Visit: Payer: Medicare Other

## 2017-01-15 DIAGNOSIS — I890 Lymphedema, not elsewhere classified: Secondary | ICD-10-CM | POA: Diagnosis not present

## 2017-01-15 DIAGNOSIS — M6281 Muscle weakness (generalized): Secondary | ICD-10-CM

## 2017-01-15 NOTE — Therapy (Signed)
[facility] [address] [place], [place], [address] Phone: [phone]   Fax:  [phone]  Physical Therapy Treatment  Patient Details  Name: Vickie Pham MRN: [id number] Date of Birth: December 12, 1935 Referring Provider: Dr. Linna Caprice  Encounter Date: 01/15/2017      PT End of Session - 01/15/17 1200    Visit Number 3   Number of Visits 19   Date for PT Re-Evaluation 02/25/17   PT Start Time 1021   PT Stop Time 1150   PT Time Calculation (min) 89 min   Activity Tolerance Patient tolerated treatment well   Behavior During Therapy [facility] for tasks assessed/performed      Past Medical History:  Diagnosis Date  . Arthritis   . Diabetes mellitus without complication (HCC)   . DVT (deep venous thrombosis) (HCC)   . Hypertension   . Lymphedema of lower extremity     Past Surgical History:  Procedure Laterality Date  . ABDOMINAL HYSTERECTOMY    . CHOLECYSTECTOMY      There were no vitals filed for this visit.      Subjective Assessment - 01/15/17 1028    Subjective The bandage was toelrable, I left it on. Also was able to find some shoes to wear with them with tread on the bottom.    Pertinent History pt needs a left total hip replacement and has had more difficulty in  past few weeks. 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  She has had lymphedema in both lower extremties since 2011 with history of blood clots.  She needs to have reduction of LE lymphedema prior to hip surgery.    Currently in Pain? Yes   Pain Score 7   Has had any pain pills   Pain Location Hip   Pain Orientation Left   Pain Descriptors / Indicators Dull   Pain Type Chronic pain   Pain Onset More than a month ago   Pain Frequency Constant   Aggravating Factors  walking   Pain Relieving Factors rest               LYMPHEDEMA/ONCOLOGY QUESTIONNAIRE - 01/15/17 1030      Left Lower Extremity Lymphedema   30 cm Proximal to  Floor at Lateral Plantar Foot 34.8 cm   20 cm Proximal to Floor at Lateral Plantar Foot 30.1 cm   10 cm Proximal to Floor at Lateral Malleoli 29.6 cm   5 cm Proximal to 1st MTP Joint 26.2 cm   Around Proximal Great Toe 8.6 cm                  [facility] Adult PT Treatment/Exercise - 01/15/17 0001      Shoulder Exercises: Supine   Horizontal ABduction Strengthening;Both;10 reps;Theraband   Theraband Level (Shoulder Horizontal ABduction) Level 1 (Yellow)   Horizontal ABduction Limitations VC throughout for correct UE technique   External Rotation Strengthening;Both;10 reps;Theraband   Theraband Level (Shoulder External Rotation) Level 1 (Yellow)   External Rotation Limitations VC throughout for correct UE position   Flexion Strengthening;Both;5 reps;Theraband  Narrow and Wide Grip 5 times each   Theraband Level (Shoulder Flexion) Level 1 (Yellow)   Flexion Limitations Some pain in Lt shoulder towards end of each so reminded pt not to push into pain and stop at that point.    Other Supine Exercises Tried Rt D2 but htis was painful for pt and her Lt shoulder is even more limited so did not  add this one to HEP.      Manual Therapy   Manual Therapy Manual Lymphatic Drainage (MLD);Compression Bandaging   Manual therapy comments Removed pts bandages and washed Lt LE asessing skin which looked great. Also took circumference measurements.    Manual Lymphatic Drainage (MLD) In Supine: Short neck, superifical and deep abdominals, Lt axillary nodes, Lt inguino-axillary anastomosis, then Lt LE from dorsal foot to lateral thigh working from proximal to distal then retracing all steps; then same on Rt.    Compression Bandaging To Bil LE: Biotone lotion, thick stockinette, elastomull to first 4 toes, Artiflex x1, then 1-6, 1-8, 2-12 cm short stretch bandages from foot to knee.                 PT Education - 01/15/17 1149    Education provided Yes   Education Details Supine scapular series  and care of bandages   Person(s) Educated Patient   Methods Explanation;Demonstration;Handout;Verbal cues   Comprehension Verbalized understanding;Returned demonstration;Need further instruction                Long Term Clinic Goals - 01/09/17 1812      CC Long Term Goal  #1   Title Pt will have decrease in left lower extremity circumference at 10 cm proximal to the floor to 30 cm    Baseline 33.5 cm    Time 6   Period Weeks   Status New     CC Long Term Goal  #2   Title Pt will have decrease in right lower extremity circumference at 10 cm proximal to the floor to 30 cm    Baseline 33 cm    Time 6   Period Weeks   Status New     CC Long Term Goal  #3   Title Pt will have ordered compression garments for maintenance of reduction of bilateral leg lymphedema    Time 6   Period Weeks   Status New     CC Long Term Goal  #4   Title Pt will be independent in home exercise for general strength    Time 6   Period Weeks   Status New            Plan - 01/15/17 1200    Clinical Impression Statement Pt did very well with being bandages for first time. She came back in with bandages on and reported them feeling good/had no pain. She also had great reductions with her circumference measurements. So continued with treatment to Lt and added bandaging to Rt LE as done on Lt. Also added supine scapular series to HEP which pt will need review of at next visit as she struggled with correct technique.    Rehab Potential Good   Clinical Impairments Affecting Rehab Potential left hip arthritis with decreased range of motion  requiring THR   PT Frequency 3x / week   PT Duration 6 weeks   PT Treatment/Interventions ADLs/Self Care Home Management;Patient/family education;Orthotic Fit/Training;DME Instruction;Manual techniques;Manual lymph drainage;Compression bandaging;Therapeutic exercise;Therapeutic activities   PT Next Visit Plan Cont complete decongestive therapy starting with bil  legs. Instruct in elevation and exercise.  Review supine scapular series for UE strenthening with glute and quad sets. repetitve sit to stand to prehab for THR    Consulted and Agree with Plan of Care Patient      Patient will benefit from skilled therapeutic intervention in order to improve the following deficits and impairments:  Obesity, Increased edema, Decreased knowledge of  precautions, Decreased knowledge of use of DME, Decreased strength, Pain, Difficulty walking, Postural dysfunction  Visit Diagnosis: Lymphedema, not elsewhere classified  Muscle weakness (generalized)     Problem List Patient Active Problem List   Diagnosis Date Noted  . DYSPNEA 04/04/2010  . DIABETIC  RETINOPATHY 02/02/2010  . ANEMIA 12/28/2009  . MICROALBUMINURIA 12/08/2009  . CERUMEN IMPACTION 12/07/2009  . VENTRAL HERNIA WITH INCARCERATION 11/08/2009  . VITAMIN D DEFICIENCY 09/01/2009  . PERIUMBILICAL HERNIA 09/01/2009  . KNEE PAIN 09/01/2009  . URINALYSIS, ABNORMAL 09/01/2009  . HEART MURMUR, SYSTOLIC 02/28/2009  . VIRAL INFECTION 08/16/2008  . LEG PAIN, BILATERAL 05/25/2008  . UNSPECIFIED VENOUS INSUFFICIENCY 09/09/2007  . DIABETES MELLITUS, TYPE II 04/10/2007  . DIABETES MELLITUS, TYPE II, WITH NEUROLOGICAL COMPLICATIONS 04/10/2007  . DYSLIPIDEMIA 04/10/2007  . RETINOPATHY, DIABETIC, BACKGROUND 04/10/2007  . HYPERTENSION 04/10/2007  . EDEMA 04/10/2007  . DVT, HX OF 04/10/2007  . GASTRIC POLYP, HX OF 04/10/2007  . HERNIORRHAPHY, HX OF 04/10/2007  . GI BLEEDING 07/25/2006    Hermenia Bers, PTA 01/15/2017, 12:04 PM  [facility] [address] [place], [place], [address] Phone: [phone]   Fax:  [phone]  Name: Deshaun Schou MRN: [id number] Date of Birth: 12-Feb-1936

## 2017-01-15 NOTE — Patient Instructions (Addendum)
Over Head Pull: Narrow and Wide Grip   Cancer Rehab [phone]   On back, knees bent, feet flat, band across thighs, elbows straight but relaxed. Pull hands apart (start). Keeping elbows straight, bring arms up and over head, hands toward floor. Keep pull steady on band. Hold momentarily. Return slowly, keeping pull steady, back to start. Then do same with a wider grip on the band (past shoulder width) Repeat _5-10__ times. Band color __yellow____   Side Pull: Double Arm   On back, knees bent, feet flat. Arms perpendicular to body, shoulder level, elbows straight but relaxed. Pull arms out to sides, elbows straight. Resistance band comes across collarbones, hands toward floor. Hold momentarily. Slowly return to starting position. Repeat _5-10__ times. Band color _yellow____    Shoulder Rotation: Double Arm   On back, knees bent, feet flat, elbows tucked at sides, bent 90, hands palms up. Pull hands apart and down toward floor, keeping elbows near sides. Hold momentarily. Slowly return to starting position. Repeat _5-10__ times. Band color __yellow____    PLEASE KEEP YOUR BANDAGES ON AS LONG AS POSSIBLE TO GET THE BEST SWELLING REDUCTION. Should your bandages become uncomfortable or feel too tight, follow these steps: 1. Elevate your extremity higher than your heart.  2. Try to move your arm or leg joints against the firmness of the bandage to help with moving the fluid and allow the bandages to loosen a bit.  3. If the bandaging is still is too tight, it is ok to carefully remove the top layer.  There will still be more layers under it that can provide compression to your extremity. 4. Finally, if you STILL have significant pain after trying these steps, it is ok to take the bandage off.  Check your skin carefully for any signs of irritation  5. PLEASE bring ALL bandage materials back to your next appointment as we will reuse what we can TAKE CARE OF YOUR BANDAGES SO THEY WILL LAST LONGER  AND STAY IN BETTER CONDITION Washing bandages:  Wash periodically using a mild detergent in warm water.  Do not use fabric softener or bleach.  Place bandages in a mesh lingerie bag or in a tied off pillow case and use the gentle cycle of the washing machine or hand wash. If you hand wash, you may want to put them in the spin cycle of your washer to get the extra water out, but make sure you put them in a mesh bag first. Do not wring or stretch them while they are wet.  Drying bandages: Lay the bandages out smoothly on a towel away from direct sunlight or heating sources that can damage the fabric. Rolling bandages in a towel and gently squeezing the towel to remove excess water before laying them out can speed up the process.  If you use a drying rack, place a towel on top of the rack to lay the bandages on.  If they hang down to dry, they fabric could be stretched out and the bandage will lose its compression.   Or, keep bandages in the mesh bag and dry them in the dryer on the low or no heat cycle. Rolling bandages: Please roll your bandages after drying them so they are ready for your next treatment. If they are rolled too loose, they will be difficult to apply.  If rolled too tight, they can get stretched out.   TAKE CARE OF YOUR SKIN 1. Apply a low pH moisturizing lotion to your skin  daily 2. Avoid scratching your skin 3. Treat skin irritations quickly  4. Know the 5 warning signs of infection: redness, pain, warmth to touch, fever and increased swelling.  Call your physician immediately if you notice any of these signs of a possible infection.

## 2017-01-17 ENCOUNTER — Ambulatory Visit: Payer: Medicare Other | Admitting: Physical Therapy

## 2017-01-17 DIAGNOSIS — I890 Lymphedema, not elsewhere classified: Secondary | ICD-10-CM

## 2017-01-17 DIAGNOSIS — M6281 Muscle weakness (generalized): Secondary | ICD-10-CM

## 2017-01-17 NOTE — Therapy (Signed)
[facility] [address] [place], [place], [address] Phone: [phone]   Fax:  [phone]  Physical Therapy Treatment  Patient Details  Name: Vickie ClauseCarol Sculley MRN: [id number] Date of Birth: 11/12/1935 Referring Provider: Dr. Linna CapriceSwinteck  Encounter Date: 01/17/2017      PT End of Session - 01/17/17 1125    Visit Number 4   Number of Visits 19   Date for PT Re-Evaluation 02/25/17   PT Start Time 0940   PT Stop Time 1025   PT Time Calculation (min) 45 min   Activity Tolerance Patient tolerated treatment well   Behavior During Therapy [facility] for tasks assessed/performed      Past Medical History:  Diagnosis Date  . Arthritis   . Diabetes mellitus without complication (HCC)   . DVT (deep venous thrombosis) (HCC)   . Hypertension   . Lymphedema of lower extremity     Past Surgical History:  Procedure Laterality Date  . ABDOMINAL HYSTERECTOMY    . CHOLECYSTECTOMY      There were no vitals filed for this visit.      Subjective Assessment - 01/17/17 1122    Subjective Pt states she is doing ok once she got used to having something on her legs.    Pertinent History pt needs a left total hip replacement and has had more difficulty in  past few weeks. 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  She has had lymphedema in both lower extremties since 2011 with history of blood clots.  She needs to have reduction of LE lymphedema prior to hip surgery.    Currently in Pain? Yes   Pain Score 8    Pain Location Hip   Pain Descriptors / Indicators Dull   Pain Type Chronic pain   Pain Onset More than a month ago   Pain Frequency Constant   Aggravating Factors  walking    Pain Relieving Factors rest and pain med helps. but pt does not take her pain med when she has to come out to therapy                          [facility] - 01/17/17 0001      Knee/Hip Exercises: Supine    Bridges Left;5 reps   Bridges Limitations instructed in bridging to prepare for getting on bedpan and bed mobility after surgery      Manual Therapy   Manual therapy comments Removed pts bandages and washed Lt LE asessing skin which looked great.   Compression Bandaging To Bil LE: Biotone lotion, thick stockinette, elastomull to first 4 toes, Artiflex x1, then 1-6, 1-8, 2-12 cm short stretch bandages from foot to knee.                         Long Term Clinic Goals - 01/09/17 1812      CC Long Term Goal  #1   Title Pt will have decrease in left lower extremity circumference at 10 cm proximal to the floor to 30 cm    Baseline 33.5 cm    Time 6   Period Weeks   Status New     CC Long Term Goal  #2   Title Pt will have decrease in right lower extremity circumference at 10 cm proximal to the floor to 30 cm    Baseline 33 cm    Time 6  Period Weeks   Status New     CC Long Term Goal  #3   Title Pt will have ordered compression garments for maintenance of reduction of bilateral leg lymphedema    Time 6   Period Weeks   Status New     CC Long Term Goal  #4   Title Pt will be independent in home exercise for general strength    Time 6   Period Weeks   Status New            Plan - 01/17/17 1126    Clinical Impression Statement Pt is responding well to compression bandaging.  She will take bandages off before next session as she has a foot doctor appointment, then will be wrapped for potential garment measuring on Thursday    Clinical Impairments Affecting Rehab Potential left hip arthritis with decreased range of motion  requiring THR   PT Frequency 3x / week   PT Duration 6 weeks   PT Next Visit Plan Cont complete decongestive therapy starting with bil legs. Instruct in elevation and exercise.  Review supine scapular series for UE strenthening with glute and quad sets. repetitve sit to stand to prehab for THR    Consulted and Agree with Plan of Care Patient       Patient will benefit from skilled therapeutic intervention in order to improve the following deficits and impairments:  Obesity, Increased edema, Decreased knowledge of precautions, Decreased knowledge of use of DME, Decreased strength, Pain, Difficulty walking, Postural dysfunction  Visit Diagnosis: Lymphedema, not elsewhere classified  Muscle weakness (generalized)     Problem List Patient Active Problem List   Diagnosis Date Noted  . DYSPNEA 04/04/2010  . DIABETIC  RETINOPATHY 02/02/2010  . ANEMIA 12/28/2009  . MICROALBUMINURIA 12/08/2009  . CERUMEN IMPACTION 12/07/2009  . VENTRAL HERNIA WITH INCARCERATION 11/08/2009  . VITAMIN D DEFICIENCY 09/01/2009  . PERIUMBILICAL HERNIA 09/01/2009  . KNEE PAIN 09/01/2009  . URINALYSIS, ABNORMAL 09/01/2009  . HEART MURMUR, SYSTOLIC 02/28/2009  . VIRAL INFECTION 08/16/2008  . LEG PAIN, BILATERAL 05/25/2008  . UNSPECIFIED VENOUS INSUFFICIENCY 09/09/2007  . DIABETES MELLITUS, TYPE II 04/10/2007  . DIABETES MELLITUS, TYPE II, WITH NEUROLOGICAL COMPLICATIONS 04/10/2007  . DYSLIPIDEMIA 04/10/2007  . RETINOPATHY, DIABETIC, BACKGROUND 04/10/2007  . HYPERTENSION 04/10/2007  . EDEMA 04/10/2007  . DVT, HX OF 04/10/2007  . GASTRIC POLYP, HX OF 04/10/2007  . HERNIORRHAPHY, HX OF 04/10/2007  . GI BLEEDING 07/25/2006   Bayard Hugger. Manson Passey PT  Donnetta Hail 01/17/2017, 11:29 AM  [facility] [address] [place], [place], [address] Phone: [phone]   Fax:  [phone]  Name: Laiyla Slagel MRN: [id number] Date of Birth: 1936/07/07

## 2017-01-22 ENCOUNTER — Ambulatory Visit: Payer: Medicare Other

## 2017-01-22 DIAGNOSIS — I890 Lymphedema, not elsewhere classified: Secondary | ICD-10-CM | POA: Diagnosis not present

## 2017-01-22 DIAGNOSIS — M6281 Muscle weakness (generalized): Secondary | ICD-10-CM

## 2017-01-22 NOTE — Patient Instructions (Addendum)
Cancer Rehab [phone] Quad Set    With other leg bent, foot flat, slowly tighten muscles on thigh of straight leg while counting out loud to _5___. Repeat with other leg. Repeat __20__ times. Do __3-4__ sessions per day.  http://gt2.exer.us/276   Copyright  VHI. All rights reserved.    Quad Set    Slowly tighten thigh muscles of straight leg while counting out loud to _5___. Relax. Repeat __20__ times. Do _3-4___ sessions per day.  http://gt2.exer.us/706   Copyright  VHI. All rights reserved.   Sit to Stand / Stand to Sit / Transfers    Start doing these seated on edge of bed, can progress to sitting on edge of a solid chair with arms once easier, feet flat on floor. Lean forward over feet and stand up with hands on chair arms. Sit down slowly with hands on chair arms. Repeat _5-10___ times per session. Do __3-4__ sessions per day.  Copyright  VHI. All rights reserved.

## 2017-01-22 NOTE — Therapy (Signed)
[facility] [address] [place], [place], [address] Phone: [phone]   Fax:  [phone]  Physical Therapy Treatment  Patient Details  Name: Vickie Pham MRN: [id number] Date of Birth: 1935-09-10 Referring Provider: Dr. Linna Caprice  Encounter Date: 01/22/2017      PT End of Session - 01/22/17 1452    Visit Number 5   Number of Visits 19   Date for PT Re-Evaluation 02/25/17   PT Start Time 1303   PT Stop Time 1435   PT Time Calculation (min) 92 min   Activity Tolerance Patient tolerated treatment well   Behavior During Therapy [facility] for tasks assessed/performed      Past Medical History:  Diagnosis Date  . Arthritis   . Diabetes mellitus without complication (HCC)   . DVT (deep venous thrombosis) (HCC)   . Hypertension   . Lymphedema of lower extremity     Past Surgical History:  Procedure Laterality Date  . ABDOMINAL HYSTERECTOMY    . CHOLECYSTECTOMY      There were no vitals filed for this visit.      Subjective Assessment - 01/22/17 1310    Subjective I forgot I do have a compression pump that I was given in 2015 and I was told it was just for my blood clots so I didn't think I could use it for this. But I will now when I'm not in bandages. I left my bandages on until Monday and saw my foot doctor this morning to get my toenails cut, so my ankles are swollen again. My Lt hip really bothered me over the weekend, I didn't sleep well the past 3 nights.    Pertinent History pt needs a left total hip replacement and has had more difficulty in  past few weeks. 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  She has had lymphedema in both lower extremties since 2011 with history of blood clots.  She needs to have reduction of LE lymphedema prior to hip surgery.    Currently in Pain? Yes   Pain Score 10-Worst pain ever   Pain Location Hip   Pain Orientation Left   Pain Descriptors / Indicators Dull   Pain  Type Chronic pain   Pain Onset More than a month ago   Pain Frequency Constant   Aggravating Factors  walking and even laying down over past weekend   Pain Relieving Factors not much helped this weekend                         [facility] Adult PT Treatment/Exercise - 01/22/17 0001      Manual Therapy   Manual Lymphatic Drainage (MLD) In Supine: Short neck, superifical and deep abdominals, Lt axillary nodes, Lt inguino-axillary anastomosis, then Lt LE from dorsal foot to lateral thigh working from proximal to distal then retracing all steps; then same on Rt.    Compression Bandaging To Bil LE: Biotone lotion, thick stockinette, elastomull to first 4 toes, Artiflex x1, then 1-6, 1-8, 2-12 cm short stretch bandages from foot to knee.                 PT Education - 01/22/17 1436    Education provided Yes   Education Details Quad set in sitting or laying and sit-stand (demonstrated SLR in supine but pt didnot do this today)   Person(s) Educated Patient   Methods Explanation;Demonstration;Handout   Comprehension Verbalized understanding;Returned demonstration;Need further instruction  Long Term Clinic Goals - 01/09/17 1812      CC Long Term Goal  #1   Title Pt will have decrease in left lower extremity circumference at 10 cm proximal to the floor to 30 cm    Baseline 33.5 cm    Time 6   Period Weeks   Status New     CC Long Term Goal  #2   Title Pt will have decrease in right lower extremity circumference at 10 cm proximal to the floor to 30 cm    Baseline 33 cm    Time 6   Period Weeks   Status New     CC Long Term Goal  #3   Title Pt will have ordered compression garments for maintenance of reduction of bilateral leg lymphedema    Time 6   Period Weeks   Status New     CC Long Term Goal  #4   Title Pt will be independent in home exercise for general strength    Time 6   Period Weeks   Status New            Plan - 01/22/17  1452    Clinical Impression Statement Pt is doing very well with compression bandaging. She has been leaving bandages on between appts for extended periods of time. She does already have a compression pump but was under the impression it was for her blood clots from a few years ago so instructed her that when not in bandages she can resume use of this at home. She does not feel it needs to be adjusted as she had been using it until about January. Added prehab exercises for quad/glut strength for upcoming THR surgery.    Rehab Potential Good   Clinical Impairments Affecting Rehab Potential left hip arthritis with decreased range of motion  requiring THR   PT Frequency 3x / week   PT Duration 6 weeks   PT Treatment/Interventions ADLs/Self Care Home Management;Patient/family education;Orthotic Fit/Training;DME Instruction;Manual techniques;Manual lymph drainage;Compression bandaging;Therapeutic exercise;Therapeutic activities   PT Next Visit Plan Cont complete decongestive therapy starting with bil legs. Instruct in elevation and exercise.  Review supine scapular series for UE strenthening and glute and quad sets. repetitve sit to stand to prehab for THR    Consulted and Agree with Plan of Care Patient      Patient will benefit from skilled therapeutic intervention in order to improve the following deficits and impairments:  Obesity, Increased edema, Decreased knowledge of precautions, Decreased knowledge of use of DME, Decreased strength, Pain, Difficulty walking, Postural dysfunction  Visit Diagnosis: Lymphedema, not elsewhere classified  Muscle weakness (generalized)     Problem List Patient Active Problem List   Diagnosis Date Noted  . DYSPNEA 04/04/2010  . DIABETIC  RETINOPATHY 02/02/2010  . ANEMIA 12/28/2009  . MICROALBUMINURIA 12/08/2009  . CERUMEN IMPACTION 12/07/2009  . VENTRAL HERNIA WITH INCARCERATION 11/08/2009  . VITAMIN D DEFICIENCY 09/01/2009  . PERIUMBILICAL HERNIA  09/01/2009  . KNEE PAIN 09/01/2009  . URINALYSIS, ABNORMAL 09/01/2009  . HEART MURMUR, SYSTOLIC 02/28/2009  . VIRAL INFECTION 08/16/2008  . LEG PAIN, BILATERAL 05/25/2008  . UNSPECIFIED VENOUS INSUFFICIENCY 09/09/2007  . DIABETES MELLITUS, TYPE II 04/10/2007  . DIABETES MELLITUS, TYPE II, WITH NEUROLOGICAL COMPLICATIONS 04/10/2007  . DYSLIPIDEMIA 04/10/2007  . RETINOPATHY, DIABETIC, BACKGROUND 04/10/2007  . HYPERTENSION 04/10/2007  . EDEMA 04/10/2007  . DVT, HX OF 04/10/2007  . GASTRIC POLYP, HX OF 04/10/2007  . HERNIORRHAPHY, HX OF 04/10/2007  .  GI BLEEDING 07/25/2006    Hermenia Bersosenberger, Valerie Ann, PTA 01/22/2017, 2:56 PM  [facility] [address] [place], [place], [address] Phone: [phone]   Fax:  [phone]  Name: [name] MRN: [id number] Date of Birth: 06/19/1936

## 2017-01-24 ENCOUNTER — Ambulatory Visit: Payer: Medicare Other | Attending: Orthopedic Surgery | Admitting: Physical Therapy

## 2017-01-24 DIAGNOSIS — I890 Lymphedema, not elsewhere classified: Secondary | ICD-10-CM

## 2017-01-24 DIAGNOSIS — M6281 Muscle weakness (generalized): Secondary | ICD-10-CM

## 2017-01-24 NOTE — Therapy (Signed)
[facility] [address] [place], [place], [address] Phone: [phone]   Fax:  [phone]  Physical Therapy Treatment  Patient Details  Name: Vickie Pham MRN: [id number] Date of Birth: November 28, 1935 Referring Provider: Dr. Linna Caprice  Encounter Date: 01/24/2017      PT End of Session - 01/24/17 0923    Visit Number 6   Number of Visits 19   Date for PT Re-Evaluation 02/25/17   PT Start Time 0915   PT Stop Time 1012   PT Time Calculation (min) 57 min   Activity Tolerance Patient tolerated treatment well   Behavior During Therapy [facility] for tasks assessed/performed      Past Medical History:  Diagnosis Date  . Arthritis   . Diabetes mellitus without complication (HCC)   . DVT (deep venous thrombosis) (HCC)   . Hypertension   . Lymphedema of lower extremity     Past Surgical History:  Procedure Laterality Date  . ABDOMINAL HYSTERECTOMY    . CHOLECYSTECTOMY      There were no vitals filed for this visit.      Subjective Assessment - 01/24/17 0921    Subjective Pt states she knows how to do exercises at home to get stronger to prepare for surgery and she is trying to do them  Pt says she knows how to use her pump also    Pertinent History pt needs a left total hip replacement and has had more difficulty in  past few weeks. 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  She has had lymphedema in both lower extremties since 2011 with history of blood clots.  She needs to have reduction of LE lymphedema prior to hip surgery.    Currently in Pain? Yes   Pain Score 8    Pain Location Hip   Pain Orientation Left   Pain Descriptors / Indicators Dull   Pain Type Chronic pain   Pain Onset More than a month ago                         [facility] Adult PT Treatment/Exercise - 01/24/17 0001      Self-Care   Self-Care Other Self-Care Comments   Other Self-Care Comments  educated about elevation and  exercise with ankle for conservative treatment.  Reinforced ankle circles to try to get rid of the fullness around the ankle      Knee/Hip Exercises: Supine   Other Supine Knee/Hip Exercises small circles x 5 reps with hand pointed to ceiling with 2# in each hand      Shoulder Exercises: Supine   Flexion Strengthening;Both;10 reps;Weights   Flexion Limitations chest press with 2# on dowel rod    Other Supine Exercises small circels x 5 with shoulder at 90 degrees flexion with 2# in each hand    Other Supine Exercises 2# 10 reps fo elbow flexion with forearm supinated,neutral , pronated      Manual Therapy   Manual Lymphatic Drainage (MLD) Brief MLD to both ankles at congested area above malleoli    Compression Bandaging To Bil LE: Biotone lotion, thick stockinette, elastomull to first 3toes,thick foam wagon wheels to both malleoli and thin foam to top of left foot and posterior left ankle.  Artiflex x2, then 1-6, 1-8, 2-12 cm short stretch bandages from foot to knee.  Long Term Clinic Goals - 01/24/17 [phone]      CC Long Term Goal  #1   Title Pt will have decrease in left lower extremity circumference at 10 cm proximal to the floor to 30 cm    Baseline 33.5 cm     Status On-going     CC Long Term Goal  #2   Title Pt will have decrease in right lower extremity circumference at 10 cm proximal to the floor to 30 cm    Status On-going     CC Long Term Goal  #3   Title Pt will have ordered compression garments for maintenance of reduction of bilateral leg lymphedema    Baseline Bilateral circaids ordered on 01/23/2017    Time 6   Period Weeks   Status On-going     CC Long Term Goal  #4   Title Pt will be independent in home exercise for general strength    Baseline per pt report    Status Achieved            Plan - 01/24/17 0927    Clinical Impression Statement Pt continues to have boggy edema at ankles around malleoli.  Upgraded bandaging  to include foam to apply more compression to these areas. Pt able to do arm exercises while I was working on her legs.    PT Next Visit Plan Remeasure and address goals. focus treatment on  fullness above ankles anda continue with CDT until garments arrive.     Consulted and Agree with Plan of Care Patient      Patient will benefit from skilled therapeutic intervention in order to improve the following deficits and impairments:     Visit Diagnosis: Lymphedema, not elsewhere classified  Muscle weakness (generalized)     Problem List Patient Active Problem List   Diagnosis Date Noted  . DYSPNEA 04/04/2010  . DIABETIC  RETINOPATHY 02/02/2010  . ANEMIA 12/28/2009  . MICROALBUMINURIA 12/08/2009  . CERUMEN IMPACTION 12/07/2009  . VENTRAL HERNIA WITH INCARCERATION 11/08/2009  . VITAMIN D DEFICIENCY 09/01/2009  . PERIUMBILICAL HERNIA 09/01/2009  . KNEE PAIN 09/01/2009  . URINALYSIS, ABNORMAL 09/01/2009  . HEART MURMUR, SYSTOLIC 02/28/2009  . VIRAL INFECTION 08/16/2008  . LEG PAIN, BILATERAL 05/25/2008  . UNSPECIFIED VENOUS INSUFFICIENCY 09/09/2007  . DIABETES MELLITUS, TYPE II 04/10/2007  . DIABETES MELLITUS, TYPE II, WITH NEUROLOGICAL COMPLICATIONS 04/10/2007  . DYSLIPIDEMIA 04/10/2007  . RETINOPATHY, DIABETIC, BACKGROUND 04/10/2007  . HYPERTENSION 04/10/2007  . EDEMA 04/10/2007  . DVT, HX OF 04/10/2007  . GASTRIC POLYP, HX OF 04/10/2007  . HERNIORRHAPHY, HX OF 04/10/2007  . GI BLEEDING 07/25/2006   Bayard Huggereresa K. Manson PasseyBrown, PT  Donnetta HailBrown, Takyia Sindt Krall 01/24/2017, 12:51 PM  [facility] [address] [place], [place], [address] Phone: [phone]   Fax:  [phone]  Name: Vickie Pham MRN: [id number] Date of Birth: 02/26/1936

## 2017-01-27 ENCOUNTER — Ambulatory Visit: Payer: Medicare Other | Admitting: Physical Therapy

## 2017-01-27 ENCOUNTER — Encounter: Payer: Self-pay | Admitting: Physical Therapy

## 2017-01-27 DIAGNOSIS — I890 Lymphedema, not elsewhere classified: Secondary | ICD-10-CM

## 2017-01-27 NOTE — Therapy (Signed)
[facility] [address] [place], [place], [address] Phone: [phone]   Fax:  [phone]  Physical Therapy Treatment  Patient Details  Name: Vickie Pham MRN: [id number] Date of Birth: 05/18/1936 Referring Provider: Dr. Linna CapriceSwinteck  Encounter Date: 01/27/2017      Pham End of Session - 01/27/17 1706    Visit Number 7   Number of Visits 19   Date for Pham Re-Evaluation 02/25/17   Pham Start Time 1352  Pham arrived late   Pham Stop Time 1514   Pham Time Calculation (min) 82 min   Activity Tolerance Patient tolerated treatment well   Behavior During Therapy [facility] for tasks assessed/performed      Past Medical History:  Diagnosis Date  . Arthritis   . Diabetes mellitus without complication (HCC)   . DVT (deep venous thrombosis) (HCC)   . Hypertension   . Lymphedema of lower extremity     Past Surgical History:  Procedure Laterality Date  . ABDOMINAL HYSTERECTOMY    . CHOLECYSTECTOMY      There were no vitals filed for this visit.      Subjective Assessment - 01/27/17 1355    Subjective It is hard to sleep with all of these bandages on. I have muscles I never knew I had.    Pertinent History Pham needs a left total hip replacement and has had more difficulty in  past few weeks. 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  She has had lymphedema in both lower extremties since 2011 with history of blood clots.  She needs to have reduction of LE lymphedema prior to hip surgery.    Currently in Pain? No/denies   Pain Score 0-No pain               LYMPHEDEMA/ONCOLOGY QUESTIONNAIRE - 01/27/17 1418      Left Lower Extremity Lymphedema   20 cm Proximal to Floor at Lateral Plantar Foot 30.2 cm   10 cm Proximal to Floor at Lateral Malleoli 28 cm   5 cm Proximal to 1st MTP Joint 25 cm   Around Proximal Great Toe 8.3 cm                  [facility] Adult Pham Treatment/Exercise - 01/27/17 0001      Manual  Therapy   Manual Lymphatic Drainage (MLD) In Supine: Short neck, superifical and deep abdominals, Lt axillary nodes, Lt inguino-axillary anastomosis, then Lt LE from dorsal foot to lateral thigh working from proximal to distal then retracing all steps; then same on Rt.    Compression Bandaging To Bil LE: Biotone lotion, thick stockinette, elastomull to first 3toes,thick foam wagon wheels to both malleoli and thin foam to top of left foot and posterior left ankle.  Artiflex x2, then 1-6, 1-8, 2-12 cm short stretch bandages from foot to knee.                         Long Term Clinic Goals - 01/24/17 [id number]      CC Long Term Goal  #1   Title Pham will have decrease in left lower extremity circumference at 10 cm proximal to the floor to 30 cm    Baseline 33.5 cm     Status On-going     CC Long Term Goal  #2   Title Pham will have decrease in right lower extremity circumference at 10 cm proximal to the floor to 30  cm    Status On-going     CC Long Term Goal  #3   Title Pham will have ordered compression garments for maintenance of reduction of bilateral leg lymphedema    Baseline Bilateral circaids ordered on 01/23/2017    Time 6   Period Weeks   Status On-going     CC Long Term Goal  #4   Title Pham will be independent in home exercise for general strength    Baseline per Pham report    Status Achieved            Plan - 01/27/17 1707    Clinical Impression Statement The foam applied last session helped decrease edema around bilateral malleoli. Pham demonstrates good reduction in the area bilaterally. Continued today with foam when bandaging and performed MLD to bilateral LEs.    Clinical Impairments Affecting Rehab Potential left hip arthritis with decreased range of motion  requiring THR   Pham Frequency 3x / week   Pham Duration 6 weeks   Pham Treatment/Interventions ADLs/Self Care Home Management;Patient/family education;Orthotic Fit/Training;DME Instruction;Manual techniques;Manual  lymph drainage;Compression bandaging;Therapeutic exercise;Therapeutic activities   Pham Next Visit Plan Remeasure and address goals. focus treatment on  fullness above ankles anda continue with CDT until garments arrive.     Consulted and Agree with Plan of Care Patient      Patient will benefit from skilled therapeutic intervention in order to improve the following deficits and impairments:  Obesity, Increased edema, Decreased knowledge of precautions, Decreased knowledge of use of DME, Decreased strength, Pain, Difficulty walking, Postural dysfunction  Visit Diagnosis: Lymphedema, not elsewhere classified     Problem List Patient Active Problem List   Diagnosis Date Noted  . DYSPNEA 04/04/2010  . DIABETIC  RETINOPATHY 02/02/2010  . ANEMIA 12/28/2009  . MICROALBUMINURIA 12/08/2009  . CERUMEN IMPACTION 12/07/2009  . VENTRAL HERNIA WITH INCARCERATION 11/08/2009  . VITAMIN D DEFICIENCY 09/01/2009  . PERIUMBILICAL HERNIA 09/01/2009  . KNEE PAIN 09/01/2009  . URINALYSIS, ABNORMAL 09/01/2009  . HEART MURMUR, SYSTOLIC 02/28/2009  . VIRAL INFECTION 08/16/2008  . LEG PAIN, BILATERAL 05/25/2008  . UNSPECIFIED VENOUS INSUFFICIENCY 09/09/2007  . DIABETES MELLITUS, TYPE II 04/10/2007  . DIABETES MELLITUS, TYPE II, WITH NEUROLOGICAL COMPLICATIONS 04/10/2007  . DYSLIPIDEMIA 04/10/2007  . RETINOPATHY, DIABETIC, BACKGROUND 04/10/2007  . HYPERTENSION 04/10/2007  . EDEMA 04/10/2007  . DVT, HX OF 04/10/2007  . GASTRIC POLYP, HX OF 04/10/2007  . HERNIORRHAPHY, HX OF 04/10/2007  . GI BLEEDING 07/25/2006    Vickie Pham 01/27/2017, 5:09 PM  [facility] [address] [place], [place], [address] Phone: [phone]   Fax:  [phone]  Name: Vickie Pham MRN: [id number] Date of Birth: 16-Dec-1935  Vickie Pham 01/27/17 5:09 PM

## 2017-01-29 ENCOUNTER — Telehealth: Payer: Self-pay | Admitting: Physical Therapy

## 2017-01-29 ENCOUNTER — Ambulatory Visit: Payer: Medicare Other | Admitting: Physical Therapy

## 2017-01-29 NOTE — Telephone Encounter (Signed)
Called patient when she did not show for her 1 pm appointment. She stated she was not feeling well and started to get ready but just couldn't make it in. She said she was going to go lie down. She plans on attending her appointment on Friday. Reminded pt that her appointment is at 10:15 on Friday.  Vickie LollBlaire Breedlove [place], [place] 01/29/2017 1:31 PM

## 2017-01-31 ENCOUNTER — Ambulatory Visit: Payer: Medicare Other | Admitting: Physical Therapy

## 2017-01-31 ENCOUNTER — Encounter: Payer: Self-pay | Admitting: Physical Therapy

## 2017-01-31 DIAGNOSIS — I890 Lymphedema, not elsewhere classified: Secondary | ICD-10-CM

## 2017-01-31 NOTE — Therapy (Signed)
[place], [place], [address] Phone: [phone]   Fax:  [phone]  Physical Therapy Treatment  Patient Details  Name: Vickie Pham MRN: [id number] Date of Birth: July 13, 1936 Referring Provider: Dr. Lyla Glassing  Encounter Date: 01/31/2017      PT End of Session - 01/31/17 1229    Visit Number 8   Number of Visits 19   Date for PT Re-Evaluation 02/25/17   PT Start Time 1032  pt arrived late   PT Stop Time 1145   PT Time Calculation (min) 73 min   Activity Tolerance Patient tolerated treatment well   Behavior During Therapy [facility] for tasks assessed/performed      Past Medical History:  Diagnosis Date  . Arthritis   . Diabetes mellitus without complication ([place])   . DVT (deep venous thrombosis) ([place])   . Hypertension   . Lymphedema of lower extremity     Past Surgical History:  Procedure Laterality Date  . ABDOMINAL HYSTERECTOMY    . CHOLECYSTECTOMY      There were no vitals filed for this visit.      Subjective Assessment - 01/31/17 1035    Subjective I have left my bandages on since Monday. I could not come here on Wednesday because of my hip pain.    Pertinent History pt needs a left total hip replacement and has had more difficulty in  past few weeks. 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  She has had lymphedema in both lower extremties since 2011 with history of blood clots.  She needs to have reduction of LE lymphedema prior to hip surgery.    Currently in Pain? Yes   Pain Score 7    Pain Location Hip   Pain Orientation Left   Pain Descriptors / Indicators Aching   Pain Type Chronic pain   Pain Onset More than a month ago   Pain Frequency Intermittent   Aggravating Factors  moving and walking   Pain Relieving Factors take pain pills   Effect of Pain on Daily Activities limits mobility               LYMPHEDEMA/ONCOLOGY QUESTIONNAIRE - 01/31/17 1053       Right Lower Extremity Lymphedema   30 cm Proximal to Floor at Lateral Plantar Foot 33.6 cm   20 cm Proximal to Floor at Lateral Plantar Foot 27._0 cm Proximal to Floor at Lateral Malleoli 26.6 cm   5 cm Proximal to 1st MTP Joint 24.4 cm   Around Proximal Great Toe 8.5 cm     Left Lower Extremity Lymphedema   30 cm Proximal to Floor at Lateral Plantar Foot 34 cm   20 cm Proximal to Floor at Lateral Plantar Foot 29.5 cm   10 cm Proximal to Floor at Lateral Malleoli 28 cm   5 cm Proximal to 1st MTP Joint 24.9 cm   Around Proximal Great Toe 8.5 cm                  [facility] Adult PT Treatment/Exercise - 01/31/17 0001      Manual Therapy   Manual Lymphatic Drainage (MLD) In Supine: short neck, 5 diaphragmatic breaths, Lt axillary nodes, Lt inguino-axillary anastomosis, then Lt LE from dorsal foot to lateral thigh working from proximal to distal then retracing all steps; then same on Rt.    Compression Bandaging To Bil LE: Biotone lotion, thick stockinette, elastomull to first  3toes,thick foam wagon wheels to both malleoli and thin foam to top of left foot and posterior left ankle.  Artiflex x2, then 1-6, 1-8, 2-12 cm short stretch bandages from foot to knee.                         [facility] Goals - 01/31/17 1059      CC Long Term Goal  #1   Title Pt will have decrease in left lower extremity circumference at 10 cm proximal to the floor to 30 cm    Baseline 33.5 cm, 01/31/17- 28 cm   Time 6   Period Weeks   Status Achieved     CC Long Term Goal  #2   Title Pt will have decrease in right lower extremity circumference at 10 cm proximal to the floor to 30 cm    Baseline 33 cm, 01/31/17- 26.6 cm   Time 6   Period Weeks   Status Achieved     CC Long Term Goal  #3   Title Pt will have ordered compression garments for maintenance of reduction of bilateral leg lymphedema    Baseline Bilateral circaids ordered on 01/23/2017, 01/31/17- pt awaiting arrival of  compression garments   Time 6   Period Weeks   Status On-going     CC Long Term Goal  #4   Title Pt will be independent in home exercise for general strength    Baseline per pt report    Time 6   Period Weeks   Status Achieved            Plan - 01/31/17 1230    Clinical Impression Statement Assessed pt's progress towards goals in therapy today. Pt demonstrates great reduction of bilateral LE lymphedema. She has met all reduction goals but is still awaiting arrival of compression garments. They are expected to be here in the next week or so. Will continue to see pt to keep swelling controlled until arrival of compression garments since pt is unable to manage bandaging.    Rehab Potential Good   Clinical Impairments Affecting Rehab Potential left hip arthritis with decreased range of motion  requiring THR   PT Frequency 3x / week   PT Duration 6 weeks   PT Treatment/Interventions ADLs/Self Care Home Management;Patient/family education;Orthotic Fit/Training;DME Instruction;Manual techniques;Manual lymph drainage;Compression bandaging;Therapeutic exercise;Therapeutic activities   PT Next Visit Plan focus treatment on fullness above ankles and continue with CDT until garments arrive.     Consulted and Agree with Plan of Care Patient      Patient will benefit from skilled therapeutic intervention in order to improve the following deficits and impairments:  Obesity, Increased edema, Decreased knowledge of precautions, Decreased knowledge of use of DME, Decreased strength, Pain, Difficulty walking, Postural dysfunction  Visit Diagnosis: Lymphedema, not elsewhere classified     Problem List Patient Active Problem List   Diagnosis Date Noted  . DYSPNEA 04/04/2010  . DIABETIC  RETINOPATHY 02/02/2010  . ANEMIA 12/28/2009  . MICROALBUMINURIA 12/08/2009  . CERUMEN IMPACTION 12/07/2009  . VENTRAL HERNIA WITH INCARCERATION 11/08/2009  . VITAMIN D DEFICIENCY 09/01/2009  . PERIUMBILICAL  HERNIA 71/01/2693  . KNEE PAIN 09/01/2009  . URINALYSIS, ABNORMAL 09/01/2009  . HEART MURMUR, SYSTOLIC 85/46/2703  . VIRAL INFECTION 08/16/2008  . LEG PAIN, BILATERAL 05/25/2008  . UNSPECIFIED VENOUS INSUFFICIENCY 09/09/2007  . DIABETES MELLITUS, TYPE II 04/10/2007  . DIABETES MELLITUS, TYPE II, WITH NEUROLOGICAL COMPLICATIONS 50/04/3817  . DYSLIPIDEMIA 04/10/2007  .  RETINOPATHY, DIABETIC, BACKGROUND 04/10/2007  . HYPERTENSION 04/10/2007  . EDEMA 04/10/2007  . DVT, HX OF 04/10/2007  . GASTRIC POLYP, HX OF 04/10/2007  . HERNIORRHAPHY, HX OF 04/10/2007  . GI BLEEDING 07/25/2006    Allyson Sabal [facility] 01/31/2017, 12:32 PM  [place], [place], [address] Phone: [phone]   Fax:  [phone]  Name: Rayaan Lorah MRN: [id number] Date of Birth: 1935/10/28  Manus Gunning, PT 01/31/17 12:32 PM

## 2017-02-03 ENCOUNTER — Ambulatory Visit: Payer: Medicare Other | Admitting: Physical Therapy

## 2017-02-03 ENCOUNTER — Encounter: Payer: Self-pay | Admitting: Physical Therapy

## 2017-02-03 DIAGNOSIS — I890 Lymphedema, not elsewhere classified: Secondary | ICD-10-CM | POA: Diagnosis not present

## 2017-02-03 DIAGNOSIS — M6281 Muscle weakness (generalized): Secondary | ICD-10-CM

## 2017-02-03 NOTE — Therapy (Signed)
[facility] [address] [place], [place], [address] Phone: [phone]   Fax:  [phone]  Physical Therapy Evaluation  Patient Details  Name: Vickie Pham MRN: [id number] Date of Birth: Jul 13, 1936 Referring Provider: Dr. Linna Caprice  Encounter Date: 02/03/2017      PT End of Session - 02/03/17 1447    Visit Number 9   Number of Visits 19   Date for PT Re-Evaluation 02/25/17   PT Start Time 1321   PT Stop Time 1440   PT Time Calculation (min) 79 min   Activity Tolerance Patient tolerated treatment well   Behavior During Therapy [facility] for tasks assessed/performed      Past Medical History:  Diagnosis Date  . Arthritis   . Diabetes mellitus without complication (HCC)   . DVT (deep venous thrombosis) (HCC)   . Hypertension   . Lymphedema of lower extremity     Past Surgical History:  Procedure Laterality Date  . ABDOMINAL HYSTERECTOMY    . CHOLECYSTECTOMY      There were no vitals filed for this visit.       Subjective Assessment - 02/03/17 1328    Subjective Bandages have stayed on since Friday. I feel really good today. I see the doctor tomorrow and I'm hoping he will schedule my hip replacement when I'm there tomorrow.   Pertinent History pt needs a left total hip replacement and has had more difficulty in  past few weeks. 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  She has had lymphedema in both lower extremties since 2011 with history of blood clots.  She needs to have reduction of LE lymphedema prior to hip surgery.    Patient Stated Goals Be able to get my hip replaced.   Currently in Pain? No/denies  No pain at rest today                Objective measurements completed on examination: See above findings.          [facility] Adult PT Treatment/Exercise - 02/03/17 0001      Manual Therapy   Manual Lymphatic Drainage (MLD) In Supine: short neck, 5 diaphragmatic breaths, Lt  axillary nodes, Lt inguino-axillary anastomosis, then Lt LE from dorsal foot to lateral thigh working from proximal to distal then retracing all steps; then same on Rt.    Compression Bandaging To Bil LE: Biotone lotion, thick stockinette, elastomull to first 3 toes,thick gray foam was not placed today due to pt having significant reduction in those areas and requesting we leave that foam off for today.  Artiflex x2, then 1-6, 1-8, 1-12 cm short stretch bandages from foot to knee.  The other 12 cm bandage was not placed today due to not needing it as her legs have significantly reduced. This bandage application was applied to both legs and then covered with her pantyhose per her request.                        Long Term Clinic Goals - 01/31/17 1059      CC Long Term Goal  #1   Title Pt will have decrease in left lower extremity circumference at 10 cm proximal to the floor to 30 cm    Baseline 33.5 cm, 01/31/17- 28 cm   Time 6   Period Weeks   Status Achieved     CC Long Term Goal  #2   Title Pt will have decrease in  right lower extremity circumference at 10 cm proximal to the floor to 30 cm    Baseline 33 cm, 01/31/17- 26.6 cm   Time 6   Period Weeks   Status Achieved     CC Long Term Goal  #3   Title Pt will have ordered compression garments for maintenance of reduction of bilateral leg lymphedema    Baseline Bilateral circaids ordered on 01/23/2017, 01/31/17- pt awaiting arrival of compression garments   Time 6   Period Weeks   Status On-going     CC Long Term Goal  #4   Title Pt will be independent in home exercise for general strength    Baseline per pt report    Time 6   Period Weeks   Status Achieved             Plan - 02/03/17 1447    Clinical Impression Statement Photo taken of legs today so referring orthopedic physician would be able to see that since she will be bandaged when she sees him tomorrow. She has made significant reduction gains and is very  pleased with her progress. Legs appear to be near normal size and she is hopeful she will be able to undergo a left hip replacement as a result of treatment. She will benefit from continued treatment until hip surgery and then may require a few visits to reduce swelling after surgery. Patient is awaiting compression garments that were ordered.   Rehab Potential Good   Clinical Impairments Affecting Rehab Potential left hip arthritis with decreased range of motion  requiring THR   PT Frequency 3x / week   PT Duration 6 weeks   PT Treatment/Interventions ADLs/Self Care Home Management;Patient/family education;Orthotic Fit/Training;DME Instruction;Manual techniques;Manual lymph drainage;Compression bandaging;Therapeutic exercise;Therapeutic activities   PT Next Visit Plan G-Code next visit. Continue complete decongestive therapy until garments arrive.   Consulted and Agree with Plan of Care Patient      Patient will benefit from skilled therapeutic intervention in order to improve the following deficits and impairments:  Obesity, Increased edema, Decreased knowledge of precautions, Decreased knowledge of use of DME, Decreased strength, Pain, Difficulty walking, Postural dysfunction  Visit Diagnosis: Lymphedema, not elsewhere classified  Muscle weakness (generalized)     Document Information   Photos    02/03/2017 2:15 PM  Attached To:  Outpatient Rehab on 02/03/17 with Etheleen Sia, PT  Source Information   [facility]      Problem List Patient Active Problem List   Diagnosis Date Noted  . DYSPNEA 04/04/2010  . DIABETIC  RETINOPATHY 02/02/2010  . ANEMIA 12/28/2009  . MICROALBUMINURIA 12/08/2009  . CERUMEN IMPACTION 12/07/2009  . VENTRAL HERNIA WITH INCARCERATION 11/08/2009  . VITAMIN D DEFICIENCY 09/01/2009  . PERIUMBILICAL HERNIA 09/01/2009  . KNEE PAIN 09/01/2009  . URINALYSIS, ABNORMAL 09/01/2009  . HEART MURMUR, SYSTOLIC 02/28/2009  . VIRAL INFECTION 08/16/2008  . LEG  PAIN, BILATERAL 05/25/2008  . UNSPECIFIED VENOUS INSUFFICIENCY 09/09/2007  . DIABETES MELLITUS, TYPE II 04/10/2007  . DIABETES MELLITUS, TYPE II, WITH NEUROLOGICAL COMPLICATIONS 04/10/2007  . DYSLIPIDEMIA 04/10/2007  . RETINOPATHY, DIABETIC, BACKGROUND 04/10/2007  . HYPERTENSION 04/10/2007  . EDEMA 04/10/2007  . DVT, HX OF 04/10/2007  . GASTRIC POLYP, HX OF 04/10/2007  . HERNIORRHAPHY, HX OF 04/10/2007  . GI BLEEDING 07/25/2006    Bethann Punches, PT 02/03/17 2:55 PM  [facility] [address] [place], [place], [address] Phone: [phone]   Fax:  [phone]  Name: Vickie Pham  MRN: [id number] Date of Birth: 04/10/1936

## 2017-02-05 ENCOUNTER — Ambulatory Visit: Payer: Medicare Other

## 2017-02-05 DIAGNOSIS — I890 Lymphedema, not elsewhere classified: Secondary | ICD-10-CM

## 2017-02-05 NOTE — Therapy (Signed)
[facility] [address] [place], [place], [address] Phone: [phone]   Fax:  [phone]  Physical Therapy Treatment  Patient Details  Name: Vickie Pham MRN: [id number] Date of Birth: 01/20/1936 Referring Provider: Dr. Linna Pham  Encounter Date: 02/05/2017      PT End of Session - 02/05/17 1506    Visit Number 10   Number of Visits 19   Date for PT Re-Evaluation 02/25/17   PT Start Time 1302   PT Stop Time 1430   PT Time Calculation (min) 88 min   Activity Tolerance Patient tolerated treatment well   Behavior During Therapy [facility] for tasks assessed/performed      Past Medical History:  Diagnosis Date  . Arthritis   . Diabetes mellitus without complication (HCC)   . DVT (deep venous thrombosis) (HCC)   . Hypertension   . Lymphedema of lower extremity     Past Surgical History:  Procedure Laterality Date  . ABDOMINAL HYSTERECTOMY    . CHOLECYSTECTOMY      There were no vitals filed for this visit.      Subjective Assessment - 02/05/17 1329    Subjective My toes were really tender last night and I can't wait to get these bandages off. I'm supposed to have my garments delivered tomorrow.    Pertinent History pt needs a left total hip replacement and has had more difficulty in  past few weeks. 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  She has had lymphedema in both lower extremties since 2011 with history of blood clots.  She needs to have reduction of LE lymphedema prior to hip surgery.    Patient Stated Goals Be able to get my hip replaced.   Currently in Pain? No/denies  No pain at rest                          [facility]/Exercise - 02/05/17 0001      Manual Therapy   Manual therapy comments Removed bandages and washed legs checking toes where pt c/o of new tenderness. No areas of redness and by end of session pt reported toes feeling much better, especially  with 4th toes also wrapped.    Manual Lymphatic Drainage (MLD) In Supine: short neck, 5 diaphragmatic breaths, Lt axillary nodes, Lt inguino-axillary anastomosis, then Lt LE from dorsal foot to lateral thigh working from proximal to distal then retracing all steps; then same on Rt.    Compression Bandaging To Bil LE: Biotone lotion, thick stockinette, elastomull to first 4 toes ,thick gray foam was not placed again today due to pt continuing with significant reduction in those areas and requesting we leave that foam off for today.  Artiflex x1, then 1-6, 1-8, 1-12 cm short stretch bandages from foot to knee.  The other 12 cm bandage was not placed again today due to not needing it as her legs have significantly reduced. This bandage application was applied to both legs and then covered with her pantyhose per her request.                        Long Term Clinic Goals - 01/31/17 1059      CC Long Term Goal  #1   Title Pt will have decrease in left lower extremity circumference at 10 cm proximal to the floor to 30 cm    Baseline 33.5 cm, 01/31/17- 28  cm   Time 6   Period Weeks   Status Achieved     CC Long Term Goal  #2   Title Pt will have decrease in right lower extremity circumference at 10 cm proximal to the floor to 30 cm    Baseline 33 cm, 01/31/17- 26.6 cm   Time 6   Period Weeks   Status Achieved     CC Long Term Goal  #3   Title Pt will have ordered compression garments for maintenance of reduction of bilateral leg lymphedema    Baseline Bilateral circaids ordered on 01/23/2017, 01/31/17- pt awaiting arrival of compression garments   Time 6   Period Weeks   Status On-going     CC Long Term Goal  #4   Title Pt will be independent in home exercise for general strength    Baseline per pt report    Time 6   Period Weeks   Status Achieved            Plan - Feb 16, 2017 1508    Clinical Impression Statement Pts orthopedist was pleased with her results thus far and told  her she will be ready for surgery in July. Spoke with Vickie Pham from [employer] during session today and confirmed pts garment sdelivery appt for tomorrow, here at our clinic, at 72. Pt verbalized understanding this and is pleased. Did not measure circumference today as pt will be receiving her garment stomorrow and her legs are still well reduced visibly.    Rehab Potential Good   Clinical Impairments Affecting Rehab Potential left hip arthritis with decreased range of motion requiring THR   PT Frequency 3x / week   PT Duration 6 weeks   PT Treatment/Interventions ADLs/Self Care Home Management;Patient/family education;Orthotic Fit/Training;DME Instruction;Manual techniques;Manual lymph drainage;Compression bandaging;Therapeutic exercise;Therapeutic activities   PT Next Visit Plan G-code this visit. Garments to delivered to tomorrow so possible D/C next visit (Friday) unless pt unsure of proper application of donning garments, then possibly keep her next Friday visit as a follow up.    Consulted and Agree with Plan of Care Patient      Patient will benefit from skilled therapeutic intervention in order to improve the following deficits and impairments:  Obesity, Increased edema, Decreased knowledge of precautions, Decreased knowledge of use of DME, Decreased strength, Pain, Difficulty walking, Postural dysfunction  Visit Diagnosis: Lymphedema, not elsewhere classified       G-Codes - 02-16-2017 1730    Functional Assessment Tool Used (Outpatient Only) clinical judgement   Functional Limitation Self care   Self Care Current Status (Z6109) At least 40 percent but less than 60 percent impaired, limited or restricted   Self Care Goal Status (U0454) At least 40 percent but less than 60 percent impaired, limited or restricted      Problem List Patient Active Problem List   Diagnosis Date Noted  . DYSPNEA 04/04/2010  . DIABETIC  RETINOPATHY 02/02/2010  . ANEMIA 12/28/2009  . MICROALBUMINURIA  12/08/2009  . CERUMEN IMPACTION 12/07/2009  . VENTRAL HERNIA WITH INCARCERATION 11/08/2009  . VITAMIN D DEFICIENCY 09/01/2009  . PERIUMBILICAL HERNIA 09/01/2009  . KNEE PAIN 09/01/2009  . URINALYSIS, ABNORMAL 09/01/2009  . HEART MURMUR, SYSTOLIC 02/28/2009  . VIRAL INFECTION 08/16/2008  . LEG PAIN, BILATERAL 05/25/2008  . UNSPECIFIED VENOUS INSUFFICIENCY 09/09/2007  . DIABETES MELLITUS, TYPE II 04/10/2007  . DIABETES MELLITUS, TYPE II, WITH NEUROLOGICAL COMPLICATIONS 04/10/2007  . DYSLIPIDEMIA 04/10/2007  . RETINOPATHY, DIABETIC, BACKGROUND 04/10/2007  . HYPERTENSION 04/10/2007  .  EDEMA 04/10/2007  . DVT, HX OF 04/10/2007  . GASTRIC POLYP, HX OF 04/10/2007  . HERNIORRHAPHY, HX OF 04/10/2007  . GI BLEEDING 07/25/2006    SALISBURY,DONNA 02/05/2017, 5:32 PM  [facility] [address] [place], [place], [address] Phone: [phone]   Fax:  [phone]  Name: Vickie Pham MRN: [id number] Date of Birth: 11/10/1935  Micheline Maze, PT 02/05/17 5:32 PM

## 2017-02-05 NOTE — Therapy (Signed)
[facility] [address] [place], [place], [address] Phone: [phone]   Fax:  [phone]  Physical Therapy Treatment  Patient Details  Name: Vickie ClauseCarol Erion MRN: [id number] Date of Birth: 01/20/1936 Referring Provider: Dr. Linna CapriceSwinteck  Encounter Date: 02/05/2017      PT End of Session - 02/05/17 1506    Visit Number 10   Number of Visits 19   Date for PT Re-Evaluation 02/25/17   PT Start Time 1302   PT Stop Time 1430   PT Time Calculation (min) 88 min   Activity Tolerance Patient tolerated treatment well   Behavior During Therapy [facility] for tasks assessed/performed      Past Medical History:  Diagnosis Date  . Arthritis   . Diabetes mellitus without complication (HCC)   . DVT (deep venous thrombosis) (HCC)   . Hypertension   . Lymphedema of lower extremity     Past Surgical History:  Procedure Laterality Date  . ABDOMINAL HYSTERECTOMY    . CHOLECYSTECTOMY      There were no vitals filed for this visit.      Subjective Assessment - 02/05/17 1329    Subjective My toes were really tender last night and I can't wait to get these bandages off. I'm supposed to have my garments delivered tomorrow.    Pertinent History pt needs a left total hip replacement and has had more difficulty in  past few weeks. 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  She has had lymphedema in both lower extremties since 2011 with history of blood clots.  She needs to have reduction of LE lymphedema prior to hip surgery.    Patient Stated Goals Be able to get my hip replaced.   Currently in Pain? No/denies  No pain at rest                          [facility] Adult PT Treatment/Exercise - 02/05/17 0001      Manual Therapy   Manual therapy comments Removed bandages and washed legs checking toes where pt c/o of new tenderness. No areas of redness and by end of session pt reported toes feeling much better, especially  with 4th toes also wrapped.    Manual Lymphatic Drainage (MLD) In Supine: short neck, 5 diaphragmatic breaths, Lt axillary nodes, Lt inguino-axillary anastomosis, then Lt LE from dorsal foot to lateral thigh working from proximal to distal then retracing all steps; then same on Rt.    Compression Bandaging To Bil LE: Biotone lotion, thick stockinette, elastomull to first 4 toes ,thick gray foam was not placed again today due to pt continuing with significant reduction in those areas and requesting we leave that foam off for today.  Artiflex x1, then 1-6, 1-8, 1-12 cm short stretch bandages from foot to knee.  The other 12 cm bandage was not placed again today due to not needing it as her legs have significantly reduced. This bandage application was applied to both legs and then covered with her pantyhose per her request.                        Long Term Clinic Goals - 01/31/17 1059      CC Long Term Goal  #1   Title Pt will have decrease in left lower extremity circumference at 10 cm proximal to the floor to 30 cm    Baseline 33.5 cm, 01/31/17- 28  cm   Time 6   Period Weeks   Status Achieved     CC Long Term Goal  #2   Title Pt will have decrease in right lower extremity circumference at 10 cm proximal to the floor to 30 cm    Baseline 33 cm, 01/31/17- 26.6 cm   Time 6   Period Weeks   Status Achieved     CC Long Term Goal  #3   Title Pt will have ordered compression garments for maintenance of reduction of bilateral leg lymphedema    Baseline Bilateral circaids ordered on 01/23/2017, 01/31/17- pt awaiting arrival of compression garments   Time 6   Period Weeks   Status On-going     CC Long Term Goal  #4   Title Pt will be independent in home exercise for general strength    Baseline per pt report    Time 6   Period Weeks   Status Achieved            Plan - 02/05/17 1508    Clinical Impression Statement Pts orthopedist was pleased with her results thus far and told  her she will be ready for surgery in July. Spoke with Herma Ardathy Rubel from [employer] during session today and confirmed pts garment sdelivery appt for tomorrow, here at our clinic, at 6811. Pt verbalized understanding this and is pleased. Did not measure circumference today as pt will be receiving her garment stomorrow and her legs are still well reduced visibly.    Rehab Potential Good   Clinical Impairments Affecting Rehab Potential left hip arthritis with decreased range of motion requiring THR   PT Frequency 3x / week   PT Duration 6 weeks   PT Treatment/Interventions ADLs/Self Care Home Management;Patient/family education;Orthotic Fit/Training;DME Instruction;Manual techniques;Manual lymph drainage;Compression bandaging;Therapeutic exercise;Therapeutic activities   PT Next Visit Plan G-code this visit. Garments to delivered to tomorrow so possible D/C next visit (Friday) unless pt unsure of proper application of donning garments, then possibly keep her next Friday visit as a follow up.    Consulted and Agree with Plan of Care Patient      Patient will benefit from skilled therapeutic intervention in order to improve the following deficits and impairments:  Obesity, Increased edema, Decreased knowledge of precautions, Decreased knowledge of use of DME, Decreased strength, Pain, Difficulty walking, Postural dysfunction  Visit Diagnosis: Lymphedema, not elsewhere classified     Problem List Patient Active Problem List   Diagnosis Date Noted  . DYSPNEA 04/04/2010  . DIABETIC  RETINOPATHY 02/02/2010  . ANEMIA 12/28/2009  . MICROALBUMINURIA 12/08/2009  . CERUMEN IMPACTION 12/07/2009  . VENTRAL HERNIA WITH INCARCERATION 11/08/2009  . VITAMIN D DEFICIENCY 09/01/2009  . PERIUMBILICAL HERNIA 09/01/2009  . KNEE PAIN 09/01/2009  . URINALYSIS, ABNORMAL 09/01/2009  . HEART MURMUR, SYSTOLIC 02/28/2009  . VIRAL INFECTION 08/16/2008  . LEG PAIN, BILATERAL 05/25/2008  . UNSPECIFIED VENOUS INSUFFICIENCY  09/09/2007  . DIABETES MELLITUS, TYPE II 04/10/2007  . DIABETES MELLITUS, TYPE II, WITH NEUROLOGICAL COMPLICATIONS 04/10/2007  . DYSLIPIDEMIA 04/10/2007  . RETINOPATHY, DIABETIC, BACKGROUND 04/10/2007  . HYPERTENSION 04/10/2007  . EDEMA 04/10/2007  . DVT, HX OF 04/10/2007  . GASTRIC POLYP, HX OF 04/10/2007  . HERNIORRHAPHY, HX OF 04/10/2007  . GI BLEEDING 07/25/2006    Hermenia Bersosenberger, Valerie Ann, PTA 02/05/2017, 3:17 PM  [facility] [address] [place], [place], [address] Phone: [phone]   Fax:  [phone]  Name: Vickie ClauseCarol Wilinski MRN: [id number] Date of Birth: 11/17/1935

## 2017-02-07 ENCOUNTER — Ambulatory Visit: Payer: Medicare Other | Admitting: Physical Therapy

## 2017-02-07 DIAGNOSIS — I890 Lymphedema, not elsewhere classified: Secondary | ICD-10-CM | POA: Diagnosis not present

## 2017-02-07 DIAGNOSIS — M6281 Muscle weakness (generalized): Secondary | ICD-10-CM

## 2017-02-07 NOTE — Therapy (Addendum)
[place], [place], [address] Phone: [phone]   Fax:  [phone]  Physical Therapy Treatment  Patient Details  Name: Vickie Pham MRN: [id number] Date of Birth: Jan 18, 1936 Referring Provider: Dr. Lyla Glassing  Encounter Date: 02/07/2017      PT End of Session - 02/07/17 1055    Visit Number 11   Number of Visits 19   Date for PT Re-Evaluation 02/25/17   PT Start Time 1055   PT Stop Time 1140   PT Time Calculation (min) 45 min   Activity Tolerance Patient tolerated treatment well      Past Medical History:  Diagnosis Date  . Arthritis   . Diabetes mellitus without complication ([place])   . DVT (deep venous thrombosis) ([place])   . Hypertension   . Lymphedema of lower extremity     Past Surgical History:  Procedure Laterality Date  . ABDOMINAL HYSTERECTOMY    . CHOLECYSTECTOMY      There were no vitals filed for this visit.      Subjective Assessment - 02/07/17 1056    Subjective "I cannot believe how good they feel"  Pt feels that she will be able to get her home health aide or neighbors to help her get her garments on and off.    Pertinent History pt needs a left total hip replacement and has had more difficulty in  past few weeks. 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  She has had lymphedema in both lower extremties since 2011 with history of blood clots.  She needs to have reduction of LE lymphedema prior to hip surgery.    Patient Stated Goals Be able to get my hip replaced.   Currently in Pain? No/denies               LYMPHEDEMA/ONCOLOGY QUESTIONNAIRE - 02/07/17 1108      Right Lower Extremity Lymphedema   30 cm Proximal to Floor at Lateral Plantar Foot 32.5 cm   20 cm Proximal to Floor at Lateral Plantar Foot 27.2 1   10  cm Proximal to Floor at Lateral Malleoli 25.9 cm   5 cm Proximal to 1st MTP Joint 24.8 cm   Around Proximal Great Toe 8.5 cm     Left  Lower Extremity Lymphedema   30 cm Proximal to Floor at Lateral Plantar Foot 33.9 cm   20 cm Proximal to Floor at Lateral Plantar Foot 28 cm   10 cm Proximal to Floor at Lateral Malleoli 27.1 cm   5 cm Proximal to 1st MTP Joint 26 cm   Around Proximal Great Toe 8.5 cm                  [facility] Adult PT Treatment/Exercise - 02/07/17 0001      Self-Care   Self-Care Other Self-Care Comments   Other Self-Care Comments  pt was able to direct me to apply her circaids. She should be able to verbally instruct her cargivers to apply them.  Reinforced that pt check her skin daily. Pt reported she should be able to do that without difficulty      Manual Therapy   Compression Bandaging remeasured legs.  Applied circaid garments to both legs with patient direction                              [place] - 02/07/17 1149  CC Long Term Goal  #1   Title Pt will have decrease in left lower extremity circumference at 10 cm proximal to the floor to 30 cm    Baseline 33.5 cm, 01/31/17- 28 cm, February 21, 2017 - 27.1   Status Achieved     CC Long Term Goal  #2   Title Pt will have decrease in right lower extremity circumference at 10 cm proximal to the floor to 30 cm    Baseline 33 cm, 01/31/17- 26.6 cm, 2017-02-21 - 25.9   Status Achieved     CC Long Term Goal  #3   Title Pt will have ordered compression garments for maintenance of reduction of bilateral leg lymphedema    Baseline Pt has circaid garments for both legs and will be getting Relax nighttime garments too   Status Achieved     CC Long Term Goal  #4   Title Pt will be independent in home exercise for general strength    Baseline per pt report    Status Achieved            Plan - 21-Feb-2017 1153    Clinical Impression Statement Pt has met all goals and is very pleased with her circaid garments to use to maintain the reduction of lymphedema.  She feels that she will be able to follow through at home.   She will be receiving another set of Circaids ( fitter error as original set is a little too long) and she will be receiving the Relax garment which will be softer for her to wear at night and after surgery.  She verbalizes a plan to follow through with her compression garments and pump to control her lymphedema with assist of caregivers. Pt is ready to be discharged from this episode but knows that she can return as needed in the future.    Rehab Potential Good   Clinical Impairments Affecting Rehab Potential left hip arthritis with decreased range of motion requiring THR   PT Next Visit Plan Discharge this episode of PT   Consulted and Agree with Plan of Care Patient      Patient will benefit from skilled therapeutic intervention in order to improve the following deficits and impairments:  Obesity, Increased edema, Decreased knowledge of precautions, Decreased knowledge of use of DME, Decreased strength, Pain, Difficulty walking, Postural dysfunction  Visit Diagnosis: Lymphedema, not elsewhere classified  Muscle weakness (generalized)       G-Codes - Feb 21, 2017 1151    Functional Assessment Tool Used (Outpatient Only) clincial judgement    Functional Limitation Self care   Self Care Goal Status (G2836) At least 40 percent but less than 60 percent impaired, limited or restricted   Self Care Discharge Status [phone]) At least 40 percent but less than 60 percent impaired, limited or restricted      Problem List Patient Active Problem List   Diagnosis Date Noted  . DYSPNEA 04/04/2010  . DIABETIC  RETINOPATHY 02/02/2010  . ANEMIA 12/28/2009  . MICROALBUMINURIA 12/08/2009  . CERUMEN IMPACTION 12/07/2009  . VENTRAL HERNIA WITH INCARCERATION 11/08/2009  . VITAMIN D DEFICIENCY 09/01/2009  . PERIUMBILICAL HERNIA 65/46/5035  . KNEE PAIN 09/01/2009  . URINALYSIS, ABNORMAL 09/01/2009  . HEART MURMUR, SYSTOLIC 46/56/8127  . VIRAL INFECTION 08/16/2008  . LEG PAIN, BILATERAL 05/25/2008  .  UNSPECIFIED VENOUS INSUFFICIENCY 09/09/2007  . DIABETES MELLITUS, TYPE II 04/10/2007  . DIABETES MELLITUS, TYPE II, WITH NEUROLOGICAL COMPLICATIONS 51/70/0174  . DYSLIPIDEMIA 04/10/2007  . RETINOPATHY, DIABETIC, BACKGROUND 04/10/2007  .  HYPERTENSION 04/10/2007  . EDEMA 04/10/2007  . DVT, HX OF 04/10/2007  . GASTRIC POLYP, HX OF 04/10/2007  . HERNIORRHAPHY, HX OF 04/10/2007  . GI BLEEDING 07/25/2006   PHYSICAL THERAPY DISCHARGE SUMMARY  Visits from Start of Care: 11  Current functional level related to goals / functional outcomes: Pt knows how to manage her lymphedema at home    Remaining deficits: Hip pain; pt scheduled for surgery    Education / Equipment: Home exercise, use of compression  Plan: Patient agrees to discharge.  Patient goals were met. Patient is being discharged due to being pleased with the current functional level.  ?????    Donato Heinz. Owens Shark PT  Norwood Levo 02/07/2017, 11:58 AM  [place], [place], [address] Phone: [phone]   Fax:  [phone]  Name: Shara Hartis MRN: [id number] Date of Birth: 13-Dec-1935

## 2017-02-10 ENCOUNTER — Ambulatory Visit: Payer: Medicare Other | Admitting: Physical Therapy

## 2017-02-14 ENCOUNTER — Ambulatory Visit: Payer: Medicare Other | Admitting: Physical Therapy

## 2017-02-19 ENCOUNTER — Ambulatory Visit: Payer: Self-pay | Admitting: Orthopedic Surgery

## 2017-02-20 ENCOUNTER — Ambulatory Visit: Payer: Self-pay | Admitting: Orthopedic Surgery

## 2017-02-20 NOTE — H&P (Signed)
TOTAL HIP ADMISSION H&P  Patient is admitted for left total hip arthroplasty.  Subjective:  Chief Complaint: left hip pain  HPI: Vickie Pham, 81 y.o. female, has a history of pain and functional disability in the left hip(s) due to arthritis and patient has failed non-surgical conservative treatments for greater than 12 weeks to include NSAID's and/or analgesics, flexibility and strengthening excercises, use of assistive devices, weight reduction as appropriate and activity modification.  Onset of symptoms was gradual starting >10 years ago with rapidlly worsening course since that time.The patient noted no past surgery on the left hip(s).  Patient currently rates pain in the left hip at 10 out of 10 with activity. Patient has night pain, worsening of pain with activity and weight bearing, pain that interfers with activities of daily living, pain with passive range of motion and crepitus. Patient has evidence of subchondral cysts, subchondral sclerosis, periarticular osteophytes, joint subluxation and joint space narrowing by imaging studies. This condition presents safety issues increasing the risk of falls.  There is no current active infection.  Patient Active Problem List   Diagnosis Date Noted  . DYSPNEA 04/04/2010  . DIABETIC  RETINOPATHY 02/02/2010  . ANEMIA 12/28/2009  . MICROALBUMINURIA 12/08/2009  . CERUMEN IMPACTION 12/07/2009  . VENTRAL HERNIA WITH INCARCERATION 11/08/2009  . VITAMIN D DEFICIENCY 09/01/2009  . PERIUMBILICAL HERNIA 09/01/2009  . KNEE PAIN 09/01/2009  . URINALYSIS, ABNORMAL 09/01/2009  . HEART MURMUR, SYSTOLIC 02/28/2009  . VIRAL INFECTION 08/16/2008  . LEG PAIN, BILATERAL 05/25/2008  . UNSPECIFIED VENOUS INSUFFICIENCY 09/09/2007  . DIABETES MELLITUS, TYPE II 04/10/2007  . DIABETES MELLITUS, TYPE II, WITH NEUROLOGICAL COMPLICATIONS 04/10/2007  . DYSLIPIDEMIA 04/10/2007  . RETINOPATHY, DIABETIC, BACKGROUND 04/10/2007  . HYPERTENSION 04/10/2007  . EDEMA  04/10/2007  . DVT, HX OF 04/10/2007  . GASTRIC POLYP, HX OF 04/10/2007  . HERNIORRHAPHY, HX OF 04/10/2007  . GI BLEEDING 07/25/2006   Past Medical History:  Diagnosis Date  . Arthritis   . Diabetes mellitus without complication (HCC)   . DVT (deep venous thrombosis) (HCC)   . Hypertension   . Lymphedema of lower extremity     Past Surgical History:  Procedure Laterality Date  . ABDOMINAL HYSTERECTOMY    . CHOLECYSTECTOMY       (Not in a hospital admission) No Known Allergies  Social History  Substance Use Topics  . Smoking status: Former Smoker    Types: Cigarettes  . Smokeless tobacco: Never Used  . Alcohol use No    Family History  Problem Relation Age of Onset  . Breast cancer Neg Hx      Review of Systems  Constitutional: Positive for malaise/fatigue.  HENT: Negative.   Eyes: Negative.   Respiratory: Positive for shortness of breath.   Cardiovascular: Negative.   Gastrointestinal: Negative.   Genitourinary: Negative.   Musculoskeletal: Positive for joint pain.  Skin: Negative.   Neurological: Negative.   Endo/Heme/Allergies: Negative.   Psychiatric/Behavioral: Negative.     Objective:  Physical Exam  Vitals reviewed. Constitutional: She is oriented to person, place, and time. She appears well-developed and well-nourished.  HENT:  Head: Normocephalic and atraumatic.  Eyes: Conjunctivae and EOM are normal. Pupils are equal, round, and reactive to light.  Neck: Normal range of motion. Neck supple.  Cardiovascular: Normal rate, regular rhythm and intact distal pulses.   Respiratory: Effort normal. No respiratory distress.  GI: Soft. She exhibits no distension.  Genitourinary:  Genitourinary Comments: deferred  Musculoskeletal:       Left  hip: She exhibits decreased range of motion, decreased strength and crepitus.  Neurological: She is alert and oriented to person, place, and time. She has normal reflexes.  Skin: Skin is warm and dry.  Psychiatric:  She has a normal mood and affect. Her behavior is normal. Judgment and thought content normal.    Vital signs in last 24 hours: @VSRANGES @  Labs:   Estimated body mass index is 35.58 kg/m as calculated from the following:   Height as of 12/16/16: 5\' 10"  (1.778 m).   Weight as of 12/16/16: 112.5 kg (248 lb).   Imaging Review Plain radiographs demonstrate severe degenerative joint disease of the left hip(s). The bone quality appears to be adequate for age and reported activity level.  Assessment/Plan:  End stage arthritis, left hip(s)  The patient history, physical examination, clinical judgement of the provider and imaging studies are consistent with end stage degenerative joint disease of the left hip(s) and total hip arthroplasty is deemed medically necessary. The treatment options including medical management, injection therapy, arthroscopy and arthroplasty were discussed at length. The risks and benefits of total hip arthroplasty were presented and reviewed. The risks due to aseptic loosening, infection, stiffness, dislocation/subluxation,  thromboembolic complications and other imponderables were discussed.  The patient acknowledged the explanation, agreed to proceed with the plan and consent was signed. Patient is being admitted for inpatient treatment for surgery, pain control, PT, OT, prophylactic antibiotics, VTE prophylaxis, progressive ambulation and ADL's and discharge planning.The patient is planning to be discharged home with HEP. Chronic coumadin

## 2017-03-04 NOTE — Patient Instructions (Addendum)
Vickie Pham  03/04/2017   Your procedure is scheduled on: 03-13-17  [facility]  Entrance [place]  elevators to 3rd floor to  Short Stay Center at 10:45 AM.   Call this number if you have problems the morning of surgery [phone]    Remember: ONLY 1 PERSON MAY GO WITH YOU TO SHORT STAY TO GET  READY MORNING OF YOUR SURGERY.  Do not eat food or drink liquids :After Midnight.      Take these medicines the morning of surgery with A SIP OF WATER: Hydralazine (Apresoline), Isosorbide Mononitrate (Imdur) and Clonidine (Catapres) DO NOT TAKE ANY DIABETIC MEDICATIONS DAY OF YOUR SURGERY                               You may not have any metal on your body including hair pins and              piercings  Do not wear jewelry, make-up, lotions, powders or perfumes, deodorant             Do not wear nail polish.  Do not shave  48 hours prior to surgery.     Do not bring valuables to the hospital. [facility] IS NOT             RESPONSIBLE   FOR VALUABLES.  Contacts, dentures or bridgework may not be worn into surgery.  Leave suitcase in the car. After surgery it may be brought to your room.                  Please read over the following fact sheets you were given: _____________________________________________________________________             How to Manage Your Diabetes Before and After Surgery  Why is it important to control my blood sugar before and after surgery? . Improving blood sugar levels before and after surgery helps healing and can limit problems. . A way of improving blood sugar control is eating a healthy diet by: o  Eating less sugar and carbohydrates o  Increasing activity/exercise o  Talking with your doctor about reaching your blood sugar goals . High blood sugars (greater than 180 mg/dL) can raise your risk of infections and slow your recovery, so you will need to focus on controlling your diabetes during the weeks before  surgery. . Make sure that the doctor who takes care of your diabetes knows about your planned surgery including the date and location.  How do I manage my blood sugar before surgery? . Check your blood sugar at least 4 times a day, starting 2 days before surgery, to make sure that the level is not too high or low. o Check your blood sugar the morning of your surgery when you wake up and every 2 hours until you get to the Short Stay unit. . If your blood sugar is less than 70 mg/dL, you will need to treat for low blood sugar: o Do not take insulin. o Treat a low blood sugar (less than 70 mg/dL) with  cup of clear juice (cranberry or apple), 4 glucose tablets, OR glucose gel. o Recheck blood sugar in 15 minutes after treatment (to make sure it is greater than 70 mg/dL). If your blood sugar is not greater than 70 mg/dL on recheck, call [phone]  for further instructions. . Report your blood sugar to the short stay nurse when you get to Short Stay.  . If you are admitted to the hospital after surgery: o Your blood sugar will be checked by the staff and you will probably be given insulin after surgery (instead of oral diabetes medicines) to make sure you have good blood sugar levels. o The goal for blood sugar control after surgery is 80-180 mg/dL.   WHAT DO I DO ABOUT MY DIABETES MEDICATION?  Marland Kitchen Do not take oral diabetes medicines (pills) the morning of surgery.  . THE DAY BEFORE SURGERY, take your usual dose of Metformin, and ONLY your morning dose of Glipizide               Patient Signature:  Date:   Nurse Signature:  Date:   Reviewed and Endorsed by [facility] Patient Education Committee, August 2015  [facility] - Preparing for Surgery Before surgery, you can play an important role.  Because skin is not sterile, your skin needs to be as free of germs as possible.  You can reduce the number of germs on your skin by washing with CHG (chlorahexidine gluconate) soap before surgery.   CHG is an antiseptic cleaner which kills germs and bonds with the skin to continue killing germs even after washing. Please DO NOT use if you have an allergy to CHG or antibacterial soaps.  If your skin becomes reddened/irritated stop using the CHG and inform your nurse when you arrive at Short Stay. Do not shave (including legs and underarms) for at least 48 hours prior to the first CHG shower.  You may shave your face/neck. Please follow these instructions carefully:  1.  Shower with CHG Soap the night before surgery and the  morning of Surgery.  2.  If you choose to wash your hair, wash your hair first as usual with your  normal  shampoo.  3.  After you shampoo, rinse your hair and body thoroughly to remove the  shampoo.                           4.  Use CHG as you would any other liquid soap.  You can apply chg directly  to the skin and wash                       Gently with a scrungie or clean washcloth.  5.  Apply the CHG Soap to your body ONLY FROM THE NECK DOWN.   Do not use on face/ open                           Wound or open sores. Avoid contact with eyes, ears mouth and genitals (private parts).                       Wash face,  Genitals (private parts) with your normal soap.             6.  Wash thoroughly, paying special attention to the area where your surgery  will be performed.  7.  Thoroughly rinse your body with warm water from the neck down.  8.  DO NOT shower/wash with your normal soap after using and rinsing off  the CHG Soap.                9.  Dennie Bible  yourself dry with a clean towel.            10.  Wear clean pajamas.            11.  Place clean sheets on your bed the night of your first shower and do not  sleep with pets. Day of Surgery : Do not apply any lotions/deodorants the morning of surgery.  Please wear clean clothes to the hospital/surgery center.  FAILURE TO FOLLOW THESE INSTRUCTIONS MAY RESULT IN THE CANCELLATION OF YOUR SURGERY PATIENT  SIGNATURE_________________________________  NURSE SIGNATURE__________________________________  ________________________________________________________________________   Vickie Pham  An incentive spirometer is a tool that can help keep your lungs clear and active. This tool measures how well you are filling your lungs with each breath. Taking long deep breaths may help reverse or decrease the chance of developing breathing (pulmonary) problems (especially infection) following:  A long period of time when you are unable to move or be active. BEFORE THE PROCEDURE   If the spirometer includes an indicator to show your best effort, your nurse or respiratory therapist will set it to a desired goal.  If possible, sit up straight or lean slightly forward. Try not to slouch.  Hold the incentive spirometer in an upright position. INSTRUCTIONS FOR USE  1. Sit on the edge of your bed if possible, or sit up as far as you can in bed or on a chair. 2. Hold the incentive spirometer in an upright position. 3. Breathe out normally. 4. Place the mouthpiece in your mouth and seal your lips tightly around it. 5. Breathe in slowly and as deeply as possible, raising the piston or the ball toward the top of the column. 6. Hold your breath for 3-5 seconds or for as long as possible. Allow the piston or ball to fall to the bottom of the column. 7. Remove the mouthpiece from your mouth and breathe out normally. 8. Rest for a few seconds and repeat Steps 1 through 7 at least 10 times every 1-2 hours when you are awake. Take your time and take a few normal breaths between deep breaths. 9. The spirometer may include an indicator to show your best effort. Use the indicator as a goal to work toward during each repetition. 10. After each set of 10 deep breaths, practice coughing to be sure your lungs are clear. If you have an incision (the cut made at the time of surgery), support your incision when coughing  by placing a pillow or rolled up towels firmly against it. Once you are able to get out of bed, walk around indoors and cough well. You may stop using the incentive spirometer when instructed by your caregiver.  RISKS AND COMPLICATIONS  Take your time so you do not get dizzy or light-headed.  If you are in pain, you may need to take or ask for pain medication before doing incentive spirometry. It is harder to take a deep breath if you are having pain. AFTER USE  Rest and breathe slowly and easily.  It can be helpful to keep track of a log of your progress. Your caregiver can provide you with a simple table to help with this. If you are using the spirometer at home, follow these instructions: [place] IF:   You are having difficultly using the spirometer.  You have trouble using the spirometer as often as instructed.  Your pain medication is not giving enough relief while using the spirometer.  You develop fever of  100.5 F (38.1 C) or higher. SEEK IMMEDIATE MEDICAL CARE IF:   You cough up bloody sputum that had not been present before.  You develop fever of 102 F (38.9 C) or greater.  You develop worsening pain at or near the incision site. MAKE SURE YOU:   Understand these instructions.  Will watch your condition.  Will get help right away if you are not doing well or get worse. Document Released: 12/23/2006 Document Revised: 11/04/2011 Document Reviewed: 02/23/2007 [facility] Patient Information 2014 [place], [place].   ________________________________________________________________________

## 2017-03-05 ENCOUNTER — Encounter (HOSPITAL_COMMUNITY)
Admission: RE | Admit: 2017-03-05 | Discharge: 2017-03-05 | Disposition: A | Discharge status: Home / Self Care | Payer: Medicare Other | Source: Ambulatory Visit | Attending: Orthopedic Surgery | Admitting: Orthopedic Surgery

## 2017-03-05 ENCOUNTER — Encounter (HOSPITAL_COMMUNITY): Payer: Self-pay

## 2017-03-05 DIAGNOSIS — R9431 Abnormal electrocardiogram [ECG] [EKG]: Secondary | ICD-10-CM | POA: Insufficient documentation

## 2017-03-05 DIAGNOSIS — Z01818 Encounter for other preprocedural examination: Secondary | ICD-10-CM | POA: Insufficient documentation

## 2017-03-05 DIAGNOSIS — Z01812 Encounter for preprocedural laboratory examination: Secondary | ICD-10-CM | POA: Insufficient documentation

## 2017-03-05 DIAGNOSIS — M1612 Unilateral primary osteoarthritis, left hip: Secondary | ICD-10-CM | POA: Diagnosis not present

## 2017-03-05 DIAGNOSIS — E119 Type 2 diabetes mellitus without complications: Secondary | ICD-10-CM | POA: Insufficient documentation

## 2017-03-05 DIAGNOSIS — I10 Essential (primary) hypertension: Secondary | ICD-10-CM | POA: Insufficient documentation

## 2017-03-05 LAB — CBC
HCT: 33.4 % — ABNORMAL LOW (ref 36.0–46.0)
Hemoglobin: 10.8 g/dL — ABNORMAL LOW (ref 12.0–15.0)
MCH: 27.6 pg (ref 26.0–34.0)
MCHC: 32.3 g/dL (ref 30.0–36.0)
MCV: 85.4 fL (ref 78.0–100.0)
Platelets: 299 10*3/uL (ref 150–400)
RBC: 3.91 MIL/uL (ref 3.87–5.11)
RDW: 15.4 % (ref 11.5–15.5)
WBC: 7.7 10*3/uL (ref 4.0–10.5)

## 2017-03-05 LAB — BASIC METABOLIC PANEL
Anion gap: 12 (ref 5–15)
BUN: 34 mg/dL — AB (ref 6–20)
CALCIUM: 9.2 mg/dL (ref 8.9–10.3)
CHLORIDE: 103 mmol/L (ref 101–111)
CO2: 25 mmol/L (ref 22–32)
CREATININE: 1.46 mg/dL — AB (ref 0.44–1.00)
GFR, EST AFRICAN AMERICAN: 38 mL/min — AB (ref >60)
GFR, EST NON AFRICAN AMERICAN: 33 mL/min — AB (ref >60)
Glucose, Bld: 148 mg/dL — ABNORMAL HIGH (ref 65–99)
Potassium: 4.4 mmol/L (ref 3.5–5.1)
SODIUM: 140 mmol/L (ref 135–145)

## 2017-03-05 LAB — GLUCOSE, CAPILLARY: GLUCOSE-CAPILLARY: 127 mg/dL — AB (ref 65–99)

## 2017-03-05 LAB — SURGICAL PCR SCREEN
MRSA, PCR: NEGATIVE
STAPHYLOCOCCUS AUREUS: NEGATIVE

## 2017-03-05 LAB — NO BLOOD PRODUCTS

## 2017-03-05 NOTE — Progress Notes (Addendum)
03-05-17 BMP and HGA1C results, routed to Dr. Linna CapriceSwinteck for review.

## 2017-03-06 LAB — HEMOGLOBIN A1C
Hgb A1c MFr Bld: 8.2 % — ABNORMAL HIGH (ref 4.8–5.6)
MEAN PLASMA GLUCOSE: 189 mg/dL

## 2017-03-07 NOTE — Progress Notes (Signed)
02-17-17 Surgical clearance from Dr. Mikeal HawthorneGarba on chart. This was received after PAT appointment. No blood consent form faxed to MD and Blood Bank with 'successful confirmation received' on chart.

## 2017-03-13 ENCOUNTER — Encounter (HOSPITAL_COMMUNITY): Payer: Self-pay

## 2017-03-13 ENCOUNTER — Ambulatory Visit (HOSPITAL_COMMUNITY): Payer: Medicare Other

## 2017-03-13 ENCOUNTER — Encounter (HOSPITAL_COMMUNITY)
Admission: RE | Disposition: A | Discharge status: Home / Self Care | Payer: Self-pay | Source: Ambulatory Visit | Attending: Orthopedic Surgery

## 2017-03-13 ENCOUNTER — Inpatient Hospital Stay (HOSPITAL_COMMUNITY): Payer: Medicare Other | Admitting: Anesthesiology

## 2017-03-13 ENCOUNTER — Ambulatory Visit (HOSPITAL_COMMUNITY)
Admission: RE | Admit: 2017-03-13 | Discharge: 2017-03-13 | Disposition: A | Discharge status: Home / Self Care | Payer: Medicare Other | Source: Ambulatory Visit | Attending: Orthopedic Surgery | Admitting: Orthopedic Surgery

## 2017-03-13 DIAGNOSIS — R791 Abnormal coagulation profile: Secondary | ICD-10-CM | POA: Diagnosis not present

## 2017-03-13 DIAGNOSIS — Z5309 Procedure and treatment not carried out because of other contraindication: Secondary | ICD-10-CM | POA: Insufficient documentation

## 2017-03-13 DIAGNOSIS — Z6836 Body mass index (BMI) 36.0-36.9, adult: Secondary | ICD-10-CM | POA: Diagnosis not present

## 2017-03-13 DIAGNOSIS — Z79899 Other long term (current) drug therapy: Secondary | ICD-10-CM | POA: Diagnosis not present

## 2017-03-13 DIAGNOSIS — E1151 Type 2 diabetes mellitus with diabetic peripheral angiopathy without gangrene: Secondary | ICD-10-CM | POA: Insufficient documentation

## 2017-03-13 DIAGNOSIS — Z7984 Long term (current) use of oral hypoglycemic drugs: Secondary | ICD-10-CM | POA: Insufficient documentation

## 2017-03-13 DIAGNOSIS — Z419 Encounter for procedure for purposes other than remedying health state, unspecified: Secondary | ICD-10-CM

## 2017-03-13 DIAGNOSIS — Z01812 Encounter for preprocedural laboratory examination: Secondary | ICD-10-CM | POA: Diagnosis not present

## 2017-03-13 DIAGNOSIS — D649 Anemia, unspecified: Secondary | ICD-10-CM | POA: Insufficient documentation

## 2017-03-13 DIAGNOSIS — Z7901 Long term (current) use of anticoagulants: Secondary | ICD-10-CM | POA: Insufficient documentation

## 2017-03-13 DIAGNOSIS — Z87891 Personal history of nicotine dependence: Secondary | ICD-10-CM | POA: Insufficient documentation

## 2017-03-13 DIAGNOSIS — M1990 Unspecified osteoarthritis, unspecified site: Secondary | ICD-10-CM | POA: Diagnosis not present

## 2017-03-13 DIAGNOSIS — I10 Essential (primary) hypertension: Secondary | ICD-10-CM | POA: Diagnosis not present

## 2017-03-13 LAB — PROTIME-INR
INR: 1.32
PROTHROMBIN TIME: 16.4 s — AB (ref 11.4–15.2)

## 2017-03-13 LAB — GLUCOSE, CAPILLARY: GLUCOSE-CAPILLARY: 179 mg/dL — AB (ref 65–99)

## 2017-03-13 SURGERY — ARTHROPLASTY, HIP, TOTAL, ANTERIOR APPROACH
Anesthesia: Spinal | Site: Hip | Laterality: Left

## 2017-03-13 MED ORDER — LACTATED RINGERS IV SOLN
INTRAVENOUS | Status: DC
  Administered 2017-03-13: 1000 mL via INTRAVENOUS

## 2017-03-13 MED ORDER — FENTANYL CITRATE (PF) 100 MCG/2ML IJ SOLN
INTRAMUSCULAR | Status: AC
  Filled 2017-03-13: qty 2

## 2017-03-13 MED ORDER — ISOPROPYL ALCOHOL 70 % SOLN
Status: AC
  Filled 2017-03-13: qty 480

## 2017-03-13 MED ORDER — BUPIVACAINE-EPINEPHRINE (PF) 0.25% -1:200000 IJ SOLN
INTRAMUSCULAR | Status: AC
  Filled 2017-03-13: qty 30

## 2017-03-13 MED ORDER — TRANEXAMIC ACID 1000 MG/10ML IV SOLN
1000.0000 mg | INTRAVENOUS | Status: DC
  Filled 2017-03-13: qty 10

## 2017-03-13 MED ORDER — CEFAZOLIN SODIUM-DEXTROSE 2-4 GM/100ML-% IV SOLN
2.0000 g | INTRAVENOUS | Status: AC
  Filled 2017-03-13: qty 100

## 2017-03-13 MED ORDER — POVIDONE-IODINE 10 % EX SWAB
2.0000 "application " | Freq: Once | CUTANEOUS | Status: AC
  Administered 2017-03-13: 2 via TOPICAL

## 2017-03-13 MED ORDER — CHLORHEXIDINE GLUCONATE 4 % EX LIQD: 60.0000 mL | Freq: Once | CUTANEOUS | Status: DC

## 2017-03-13 MED ORDER — PROPOFOL 10 MG/ML IV BOLUS
INTRAVENOUS | Status: AC
  Filled 2017-03-13: qty 20

## 2017-03-13 MED ORDER — SODIUM CHLORIDE 0.9 % IJ SOLN
INTRAMUSCULAR | Status: AC
  Filled 2017-03-13: qty 50

## 2017-03-13 MED ORDER — ACETAMINOPHEN 10 MG/ML IV SOLN
1000.0000 mg | INTRAVENOUS | Status: DC
  Filled 2017-03-13: qty 100

## 2017-03-13 MED ORDER — KETOROLAC TROMETHAMINE 30 MG/ML IJ SOLN
INTRAMUSCULAR | Status: AC
  Filled 2017-03-13: qty 1

## 2017-03-13 MED ORDER — SODIUM CHLORIDE 0.9 % IV SOLN: INTRAVENOUS | Status: DC

## 2017-03-13 NOTE — Progress Notes (Signed)
Patient took coumadin on Monday. INR today is 1.32, PT elevated at 16. Patient is anemic baseline and will not accept blood transfusion. Therefore, after thorough discussion with Dr. Sampson GoonFitzgerald, we will have to reschedule once she is medically optimized. Patient understands.

## 2017-03-13 NOTE — Progress Notes (Signed)
Coag results available and still elevated.  Patient also starting surgical preparation with lowered Hgb of 10.8 and elevated HGB A1C.  After discussing lab values and patient designation of Jehovah's Witness requesting no blood or blood products to be administered,  Dr. Lyla Glassing decided to cancel patient at this time.  Patient directed to follow up with primary medical doctor for management of anemia and blood sugar.  To start back on Coumadin till surgery rescheduled when labs resolved.  To be off Coumadin for at least 5 days and have coags. Redrawn prior to surgical date to make sure acceptable for surgery risk.  Patient discharged to home .  To car via wheelchair, accompanied by son.  Son has also discussed situation with Dr. Lyla Glassing and voices understanding of discharge instructions and requirements to be met for rescheduling surgery.

## 2017-03-13 NOTE — Anesthesia Preprocedure Evaluation (Addendum)
Anesthesia Evaluation  Patient identified by MRN, date of birth, ID band Patient awake    Reviewed: Allergy & Precautions, NPO status , Patient's Chart, lab work & pertinent test results  Airway        Dental   Pulmonary former smoker,           Cardiovascular hypertension, Pt. on medications + Peripheral Vascular Disease       Neuro/Psych negative neurological ROS     GI/Hepatic negative GI ROS, Neg liver ROS,   Endo/Other  diabetes, Type 2, Oral Hypoglycemic AgentsMorbid obesity  Renal/GU CRFRenal disease     Musculoskeletal  (+) Arthritis ,   Abdominal   Peds  Hematology  (+) Blood dyscrasia, anemia , On Coumadin   Anesthesia Other Findings   Reproductive/Obstetrics                             Anesthesia Physical Anesthesia Plan  ASA: III  Anesthesia Plan:    Post-op Pain Management:    Induction:   PONV Risk Score and Plan:   Airway Management Planned:   Additional Equipment:   Intra-op Plan:   Post-operative Plan:   Informed Consent:   Plan Discussed with:   Anesthesia Plan Comments: (Case discussed with Dr. Linna CapriceSwinteck. Pt is anemic with Hgb 10.8 with potential for large volume of blood loss. Pt discontinued her coumadin 3 or 4 days ago but is unsure. INR 1.32 and PT still elevated. Pt is Jehovah's witness and refuses all blood products. Since this is an elective surgery case is cancelled to further optimize pt. Plan to place pt on iron supplementation, ensure discontinuation of coumadin at least 5 days prior to surgery, and have hemoglobin normalized prior surgery. )       Anesthesia Quick Evaluation

## 2017-03-13 NOTE — OR Nursing (Signed)
13 March 2017 Case cancelled due to abnormal labs per anesthesia

## 2017-04-01 ENCOUNTER — Encounter (HOSPITAL_COMMUNITY): Payer: Self-pay | Admitting: Family Medicine

## 2017-04-01 ENCOUNTER — Observation Stay (HOSPITAL_COMMUNITY)
Admission: EM | Admit: 2017-04-01 | Discharge: 2017-04-02 | Disposition: A | Discharge status: Home / Self Care | Payer: Medicare Other | Attending: Internal Medicine | Admitting: Internal Medicine

## 2017-04-01 DIAGNOSIS — I160 Hypertensive urgency: Secondary | ICD-10-CM | POA: Diagnosis not present

## 2017-04-01 DIAGNOSIS — D649 Anemia, unspecified: Secondary | ICD-10-CM | POA: Diagnosis present

## 2017-04-01 DIAGNOSIS — E11649 Type 2 diabetes mellitus with hypoglycemia without coma: Principal | ICD-10-CM | POA: Diagnosis present

## 2017-04-01 DIAGNOSIS — N183 Chronic kidney disease, stage 3 unspecified: Secondary | ICD-10-CM | POA: Diagnosis present

## 2017-04-01 DIAGNOSIS — E1122 Type 2 diabetes mellitus with diabetic chronic kidney disease: Secondary | ICD-10-CM | POA: Insufficient documentation

## 2017-04-01 DIAGNOSIS — Z79899 Other long term (current) drug therapy: Secondary | ICD-10-CM | POA: Diagnosis not present

## 2017-04-01 DIAGNOSIS — E162 Hypoglycemia, unspecified: Secondary | ICD-10-CM | POA: Diagnosis not present

## 2017-04-01 DIAGNOSIS — Z86718 Personal history of other venous thrombosis and embolism: Secondary | ICD-10-CM | POA: Diagnosis not present

## 2017-04-01 DIAGNOSIS — E119 Type 2 diabetes mellitus without complications: Secondary | ICD-10-CM

## 2017-04-01 DIAGNOSIS — Z7901 Long term (current) use of anticoagulants: Secondary | ICD-10-CM | POA: Insufficient documentation

## 2017-04-01 DIAGNOSIS — I10 Essential (primary) hypertension: Secondary | ICD-10-CM | POA: Diagnosis not present

## 2017-04-01 DIAGNOSIS — I130 Hypertensive heart and chronic kidney disease with heart failure and stage 1 through stage 4 chronic kidney disease, or unspecified chronic kidney disease: Secondary | ICD-10-CM | POA: Insufficient documentation

## 2017-04-01 DIAGNOSIS — Z794 Long term (current) use of insulin: Secondary | ICD-10-CM | POA: Diagnosis not present

## 2017-04-01 DIAGNOSIS — D631 Anemia in chronic kidney disease: Secondary | ICD-10-CM | POA: Diagnosis not present

## 2017-04-01 DIAGNOSIS — I5032 Chronic diastolic (congestive) heart failure: Secondary | ICD-10-CM | POA: Diagnosis present

## 2017-04-01 LAB — COMPREHENSIVE METABOLIC PANEL
ALT: 17 U/L (ref 14–54)
AST: 22 U/L (ref 15–41)
Albumin: 4.2 g/dL (ref 3.5–5.0)
Alkaline Phosphatase: 61 U/L (ref 38–126)
Anion gap: 11 (ref 5–15)
BUN: 42 mg/dL — AB (ref 6–20)
CHLORIDE: 108 mmol/L (ref 101–111)
CO2: 22 mmol/L (ref 22–32)
Calcium: 9.3 mg/dL (ref 8.9–10.3)
Creatinine, Ser: 1.47 mg/dL — ABNORMAL HIGH (ref 0.44–1.00)
GFR calc Af Amer: 38 mL/min — ABNORMAL LOW (ref >60)
GFR, EST NON AFRICAN AMERICAN: 33 mL/min — AB (ref >60)
Glucose, Bld: 55 mg/dL — ABNORMAL LOW (ref 65–99)
Potassium: 3.9 mmol/L (ref 3.5–5.1)
Sodium: 141 mmol/L (ref 135–145)
Total Bilirubin: 0.4 mg/dL (ref 0.3–1.2)
Total Protein: 7.6 g/dL (ref 6.5–8.1)

## 2017-04-01 LAB — GLUCOSE, CAPILLARY
GLUCOSE-CAPILLARY: 66 mg/dL (ref 65–99)
GLUCOSE-CAPILLARY: 50 mg/dL — AB (ref 65–99)
GLUCOSE-CAPILLARY: 147 mg/dL — AB (ref 65–99)
GLUCOSE-CAPILLARY: 143 mg/dL — AB (ref 65–99)
GLUCOSE-CAPILLARY: 161 mg/dL — AB (ref 65–99)
Glucose-Capillary: 110 mg/dL — ABNORMAL HIGH (ref 65–99)
Glucose-Capillary: 175 mg/dL — ABNORMAL HIGH (ref 65–99)

## 2017-04-01 LAB — URINALYSIS, ROUTINE W REFLEX MICROSCOPIC
Bacteria, UA: NONE SEEN
Bilirubin Urine: NEGATIVE
GLUCOSE, UA: NEGATIVE mg/dL
HGB URINE DIPSTICK: NEGATIVE
KETONES UR: NEGATIVE mg/dL
LEUKOCYTES UA: NEGATIVE
NITRITE: NEGATIVE
PH: 5 (ref 5.0–8.0)
Protein, ur: 30 mg/dL — AB
Specific Gravity, Urine: 1.01 (ref 1.005–1.030)

## 2017-04-01 LAB — CBC
HEMATOCRIT: 34.6 % — AB (ref 36.0–46.0)
HEMOGLOBIN: 11.2 g/dL — AB (ref 12.0–15.0)
MCH: 27.7 pg (ref 26.0–34.0)
MCHC: 32.4 g/dL (ref 30.0–36.0)
MCV: 85.4 fL (ref 78.0–100.0)
Platelets: 272 10*3/uL (ref 150–400)
RBC: 4.05 MIL/uL (ref 3.87–5.11)
RDW: 16.5 % — AB (ref 11.5–15.5)
WBC: 8.1 10*3/uL (ref 4.0–10.5)

## 2017-04-01 LAB — CBG MONITORING, ED
GLUCOSE-CAPILLARY: 47 mg/dL — AB (ref 65–99)
Glucose-Capillary: 95 mg/dL (ref 65–99)

## 2017-04-01 LAB — PROTIME-INR
INR: 1.44
PROTHROMBIN TIME: 17.7 s — AB (ref 11.4–15.2)

## 2017-04-01 LAB — LIPASE, BLOOD: LIPASE: 42 U/L (ref 11–51)

## 2017-04-01 MED ORDER — ACETAMINOPHEN 325 MG PO TABS: 650.0000 mg | ORAL_TABLET | Freq: Four times a day (QID) | ORAL | Status: DC | PRN

## 2017-04-01 MED ORDER — POTASSIUM CHLORIDE CRYS ER 20 MEQ PO TBCR
20.0000 meq | EXTENDED_RELEASE_TABLET | Freq: Every day | ORAL | Status: DC
  Administered 2017-04-01 – 2017-04-02 (×2): 20 meq via ORAL
  Filled 2017-04-01 (×2): qty 1

## 2017-04-01 MED ORDER — CLONIDINE HCL 0.1 MG PO TABS
0.2000 mg | ORAL_TABLET | Freq: Three times a day (TID) | ORAL | Status: DC
  Administered 2017-04-01 – 2017-04-02 (×4): 0.2 mg via ORAL
  Filled 2017-04-01 (×4): qty 2

## 2017-04-01 MED ORDER — ATORVASTATIN CALCIUM 20 MG PO TABS
20.0000 mg | ORAL_TABLET | Freq: Every day | ORAL | Status: DC
  Administered 2017-04-01: 20 mg via ORAL
  Filled 2017-04-01: qty 2
  Filled 2017-04-01: qty 1

## 2017-04-01 MED ORDER — SODIUM CHLORIDE 0.9% FLUSH
3.0000 mL | Freq: Two times a day (BID) | INTRAVENOUS | Status: DC
  Administered 2017-04-01: 3 mL via INTRAVENOUS

## 2017-04-01 MED ORDER — HYDRALAZINE HCL 50 MG PO TABS
100.0000 mg | ORAL_TABLET | Freq: Four times a day (QID) | ORAL | Status: DC
  Administered 2017-04-01 – 2017-04-02 (×5): 100 mg via ORAL
  Filled 2017-04-01 (×6): qty 2

## 2017-04-01 MED ORDER — FUROSEMIDE 40 MG PO TABS
80.0000 mg | ORAL_TABLET | Freq: Every day | ORAL | Status: DC
  Administered 2017-04-01: 80 mg via ORAL
  Filled 2017-04-01: qty 2

## 2017-04-01 MED ORDER — ACETAMINOPHEN 650 MG RE SUPP: 650.0000 mg | Freq: Four times a day (QID) | RECTAL | Status: DC | PRN

## 2017-04-01 MED ORDER — WARFARIN - PHARMACIST DOSING INPATIENT: Freq: Every day | Status: DC

## 2017-04-01 MED ORDER — DEXTROSE 50 % IV SOLN
1.0000 | Freq: Once | INTRAVENOUS | Status: AC
  Administered 2017-04-01: 50 mL via INTRAVENOUS
  Filled 2017-04-01: qty 50

## 2017-04-01 MED ORDER — INSULIN ASPART 100 UNIT/ML ~~LOC~~ SOLN
0.0000 [IU] | Freq: Three times a day (TID) | SUBCUTANEOUS | Status: DC
  Administered 2017-04-01: 1 [IU] via SUBCUTANEOUS
  Administered 2017-04-02: 2 [IU] via SUBCUTANEOUS
  Administered 2017-04-02: 1 [IU] via SUBCUTANEOUS

## 2017-04-01 MED ORDER — SODIUM CHLORIDE 0.9% FLUSH: 3.0000 mL | INTRAVENOUS | Status: DC | PRN

## 2017-04-01 MED ORDER — SENNOSIDES-DOCUSATE SODIUM 8.6-50 MG PO TABS
1.0000 | ORAL_TABLET | Freq: Every day | ORAL | Status: DC
  Administered 2017-04-01 – 2017-04-02 (×2): 1 via ORAL
  Filled 2017-04-01 (×2): qty 1

## 2017-04-01 MED ORDER — WARFARIN SODIUM 5 MG PO TABS
5.0000 mg | ORAL_TABLET | Freq: Once | ORAL | Status: AC
  Administered 2017-04-01: 5 mg via ORAL
  Filled 2017-04-01: qty 1

## 2017-04-01 MED ORDER — HYDRALAZINE HCL 20 MG/ML IJ SOLN
10.0000 mg | INTRAMUSCULAR | Status: DC | PRN
  Administered 2017-04-01 (×2): 10 mg via INTRAVENOUS
  Filled 2017-04-01: qty 0.5
  Filled 2017-04-01: qty 1
  Filled 2017-04-01: qty 0.5

## 2017-04-01 MED ORDER — SODIUM CHLORIDE 0.9 % IV SOLN
INTRAVENOUS | Status: DC
  Administered 2017-04-01 – 2017-04-02 (×2): via INTRAVENOUS

## 2017-04-01 MED ORDER — ISOSORBIDE MONONITRATE ER 30 MG PO TB24
30.0000 mg | ORAL_TABLET | Freq: Every day | ORAL | Status: DC
  Administered 2017-04-01 – 2017-04-02 (×2): 30 mg via ORAL
  Filled 2017-04-01 (×2): qty 1

## 2017-04-01 MED ORDER — DEXTROSE-NACL 5-0.9 % IV SOLN
INTRAVENOUS | Status: DC
  Administered 2017-04-01: 06:00:00 via INTRAVENOUS

## 2017-04-01 MED ORDER — DEXTROSE 50 % IV SOLN
1.0000 | Freq: Once | INTRAVENOUS | Status: AC
Start: 2017-04-01 — End: 2017-04-01
  Administered 2017-04-01: 50 mL via INTRAVENOUS

## 2017-04-01 MED ORDER — LISINOPRIL 20 MG PO TABS
40.0000 mg | ORAL_TABLET | Freq: Every day | ORAL | Status: DC
  Administered 2017-04-01: 40 mg via ORAL
  Filled 2017-04-01: qty 2

## 2017-04-01 MED ORDER — ONDANSETRON HCL 4 MG/2ML IJ SOLN: 4.0000 mg | Freq: Four times a day (QID) | INTRAMUSCULAR | Status: DC | PRN

## 2017-04-01 MED ORDER — ASPIRIN EC 81 MG PO TBEC
81.0000 mg | DELAYED_RELEASE_TABLET | Freq: Every day | ORAL | Status: DC
  Administered 2017-04-01 – 2017-04-02 (×2): 81 mg via ORAL
  Filled 2017-04-01 (×2): qty 1

## 2017-04-01 MED ORDER — FERROUS SULFATE 325 (65 FE) MG PO TABS
325.0000 mg | ORAL_TABLET | Freq: Every day | ORAL | Status: DC
  Administered 2017-04-01 – 2017-04-02 (×3): 325 mg via ORAL
  Filled 2017-04-01 (×2): qty 1

## 2017-04-01 MED ORDER — HYDROCODONE-ACETAMINOPHEN 5-325 MG PO TABS
1.0000 | ORAL_TABLET | Freq: Four times a day (QID) | ORAL | Status: DC | PRN
  Administered 2017-04-01: 1 via ORAL
  Filled 2017-04-01: qty 1

## 2017-04-01 MED ORDER — SODIUM CHLORIDE 0.9 % IV SOLN
250.0000 mL | INTRAVENOUS | Status: DC | PRN
Start: 2017-04-01 — End: 2017-04-02

## 2017-04-01 MED ORDER — INSULIN ASPART 100 UNIT/ML ~~LOC~~ SOLN: 0.0000 [IU] | SUBCUTANEOUS | Status: DC

## 2017-04-01 MED ORDER — ONDANSETRON HCL 4 MG PO TABS: 4.0000 mg | ORAL_TABLET | Freq: Four times a day (QID) | ORAL | Status: DC | PRN

## 2017-04-01 MED ORDER — DEXTROSE 50 % IV SOLN
INTRAVENOUS | Status: AC
  Administered 2017-04-01: 50 mL via INTRAVENOUS
  Filled 2017-04-01: qty 50

## 2017-04-01 NOTE — ED Provider Notes (Signed)
Patient reports she was supposed to have a hip replacement done on July 19 however her blood work was very abnormal. She states she was anemic and hyperglycemic. She states for the first time she started following a diabetic diet about 10 days ago. She also was started on Januvia on August 1. She reports she was told to eat beef to increase the iron in her diet and the beef makes her nauseated and had vomiting. She states she has been getting hypoglycemic with her blood sugars in the 50s and it makes her feels shaky and sweaty.  Patient is pleasant, awake and alert and able to give her full history. She is noted to be walking with a cane.  Medical screening examination/treatment/procedure(s) were conducted as a shared visit with non-physician practitioner(s) and myself.  I personally evaluated the patient during the encounter.   EKG Interpretation None       Devoria AlbeIva Gal Feldhaus, MD, Concha PyoFACEP    Trentin Knappenberger, MD 04/01/17 [phone]

## 2017-04-01 NOTE — Care Management Note (Signed)
Case Management Note  Patient Details  Name: Vickie Pham MRN: [id number] Date of Birth: 06/22/1936  Subjective/Objective:      Hypoglycemia in known diabetic patient.              Action/Plan: Date:  April 01, 2017 Chart reviewed for concurrent status and case management needs. Will continue to follow patient progress. Discharge Planning: following for needs Expected discharge date: [id number] Vickie SmilingRhonda Pham, BSN, [place]3, [place]M   [phone]  Expected Discharge Date:                  Expected Discharge Plan:     In-House Referral:     Discharge planning Services     Post Acute Care Choice:    Choice offered to:     DME Arranged:    DME Agency:     HH Arranged:    HH Agency:     Status of Service:     If discussed at Long Length of Stay Meetings, dates discussed:    Additional Comments:  Vickie Pham, Vickie Lynn, RN 04/01/2017, 9:00 AM

## 2017-04-01 NOTE — H&P (Signed)
History and Physical    Vickie Pham RUE:[id number] DOB: Feb 06, 1936 DOA: 04/01/2017  PCP: Rometta Emery, MD   Patient coming from: Home  Chief Complaint: Hypoglycemia   HPI: Vickie Pham is a 81 y.o. female with medical history significant for type 2 diabetes mellitus, hypertension, history of DVT on warfarin, and chronic diastolic CHF, now presenting to the emergency department for evaluation of tremors, sweats, and hypoglycemia. Patient reports that she was scheduled for an elective hip replacement 3 weeks ago, but she was reportedly too hyperglycemic for this and the surgery was canceled. Her diabetes medications were adjusted and she was started on Januvia proximately one week ago. She also began following a diabetic diet. Over the past couple days, she has been developing tremors and sweats, and reports repeated CBGs in the 50s at home. She did not take her Januvia today. She has continued to take her glipizide. Denies recent fevers or chills, chest pain or palpitations, headache, change in vision or hearing, or focal numbness or weakness.  ED Course: Upon arrival to the ED, patient is found to be afebrile, saturating well on room air, hypertensive to 200/100, and with vitals otherwise stable. Chemistry panels notable for a BUN of 42 and creatinine 1.47, similar to prior. Serum glucose is 55. CBC features a stable normocytic anemia with hemoglobin of 11.2. Patient was treated with 1 ampule of 50% dextrose in the ED with improvement in her symptoms and glucose. She remained hypertensive in the ED, but otherwise stable, and will be observed on medical-surgical unit for ongoing evaluation and management of hypoglycemia and a type II diabetic.  Review of Systems:  All other systems reviewed and apart from HPI, are negative.  Past Medical History:  Diagnosis Date  . Arthritis   . Diabetes mellitus without complication (HCC)   . DVT (deep venous thrombosis) (HCC)   . Hypertension   .  Lymphedema of lower extremity     Past Surgical History:  Procedure Laterality Date  . ABDOMINAL HYSTERECTOMY    . CHOLECYSTECTOMY       reports that she quit smoking about 47 years ago. Her smoking use included Cigarettes. She has never used smokeless tobacco. She reports that she does not drink alcohol or use drugs.  Allergies  Allergen Reactions  . Blood-Group Specific Substance     Pt refuses blood    Family History  Problem Relation Age of Onset  . Breast cancer Neg Hx      Prior to Admission medications   Medication Sig Start Date End Date Taking? Authorizing Provider  aspirin EC 81 MG tablet Take 81 mg by mouth daily.   Yes [provider]  atorvastatin (LIPITOR) 20 MG tablet Take 20 mg by mouth at bedtime.   Yes [provider]  cloNIDine (CATAPRES) 0.2 MG tablet Take 0.2 mg by mouth 3 (three) times daily.   Yes [provider]  ferrous sulfate 325 (65 FE) MG tablet Take 325 mg by mouth daily with breakfast.   Yes [provider]  furosemide (LASIX) 40 MG tablet Take 80 mg by mouth daily.    Yes [provider]  glipiZIDE (GLUCOTROL) 10 MG tablet Take 10 mg by mouth 2 (two) times daily before a meal.   Yes [provider]  hydrALAZINE (APRESOLINE) 100 MG tablet Take 100 mg by mouth 4 (four) times daily.   Yes [provider]  HYDROcodone-acetaminophen (NORCO/VICODIN) 5-325 MG tablet Take 1 tablet by mouth every 6 (six) hours  as needed for moderate pain.  03/25/17  Yes [provider]  isosorbide mononitrate (IMDUR) 30 MG 24 hr tablet Take 30 mg by mouth daily.   Yes [provider]  lisinopril (PRINIVIL,ZESTRIL) 40 MG tablet Take 40 mg by mouth daily.   Yes [provider]  metFORMIN (GLUCOPHAGE) 1000 MG tablet Take 1,000 mg by mouth 2 (two) times daily with a meal.  03/21/17  Yes [provider]  potassium chloride SA (K-DUR,KLOR-CON) 20 MEQ tablet Take 20 mEq by mouth daily.    Yes [provider]  senna-docusate (SENOKOT-S) 8.6-50 MG tablet Take 1 tablet by mouth daily.   Yes [provider]  sitaGLIPtin (JANUVIA) 100 MG tablet Take 100 mg by mouth daily.   Yes [provider]  warfarin (COUMADIN) 5 MG tablet Take 5-10 mg by mouth every evening. Alternates taking one tablet and two tablets each day.   Yes [provider]    Physical Exam: Vitals:   04/01/17 0126  BP: (!) 204/103  Pulse: 60  Resp: 18  Temp: 98.1 F (36.7 C)  TempSrc: Oral  SpO2: 94%      Constitutional: NAD, calm, comfortable Eyes: PERTLA, lids and conjunctivae normal ENMT: Mucous membranes are moist. Posterior pharynx clear of any exudate or lesions.   Neck: normal, supple, no masses, no thyromegaly Respiratory: clear to auscultation bilaterally, no wheezing, no crackles. Normal respiratory effort.   Cardiovascular: S1 & S2 heard, regular rate and rhythm. No significant JVD. Abdomen: No distension, no tenderness, no masses palpated. Bowel sounds normal.  Musculoskeletal: no clubbing / cyanosis. No joint deformity upper and lower extremities.  Skin: no significant rashes, lesions, ulcers. Warm, dry, well-perfused. Neurologic: CN 2-12 grossly intact. Sensation intact, DTR normal. Strength 5/5 in all 4 limbs.  Psychiatric: Alert and oriented x 3. Calm, cooperative.     Labs on Admission: I have personally reviewed following labs and imaging studies  CBC:  Recent Labs Lab 04/01/17 0150  WBC 8.1  HGB 11.2*  HCT 34.6*  MCV 85.4  PLT 272   Basic Metabolic Panel:  Recent Labs Lab 04/01/17 0150  NA 141  K 3.9  CL 108  CO2 22  GLUCOSE 55*  BUN 42*  CREATININE 1.47*  CALCIUM 9.3   GFR: CrCl cannot be calculated (Unknown ideal weight.). Liver Function Tests:  Recent Labs Lab 04/01/17 0150  AST 22  ALT 17  ALKPHOS 61  BILITOT 0.4  PROT 7.6  ALBUMIN 4.2    Recent Labs Lab 04/01/17 0150  LIPASE 42   No results for  input(s): AMMONIA in the last 168 hours. Coagulation Profile: No results for input(s): INR, PROTIME in the last 168 hours. Cardiac Enzymes: No results for input(s): CKTOTAL, CKMB, CKMBINDEX, TROPONINI in the last 168 hours. BNP (last 3 results) No results for input(s): PROBNP in the last 8760 hours. HbA1C: No results for input(s): HGBA1C in the last 72 hours. CBG:  Recent Labs Lab 04/01/17 0125 04/01/17 0245  GLUCAP 47* 95   Lipid Profile: No results for input(s): CHOL, HDL, LDLCALC, TRIG, CHOLHDL, LDLDIRECT in the last 72 hours. Thyroid Function Tests: No results for input(s): TSH, T4TOTAL, FREET4, T3FREE, THYROIDAB in the last 72 hours. Anemia Panel: No results for input(s): VITAMINB12, FOLATE, FERRITIN, TIBC, IRON, RETICCTPCT in the last 72 hours. Urine analysis:    Component Value Date/Time   COLORURINE STRAW (A) 04/01/2017 0318   APPEARANCEUR CLEAR 04/01/2017 0318   LABSPEC 1.010 04/01/2017 0318   PHURINE 5.0 04/01/2017  0318   GLUCOSEU NEGATIVE 04/01/2017 0318   HGBUR NEGATIVE 04/01/2017 0318   HGBUR trace-intact 09/01/2009 1138   BILIRUBINUR NEGATIVE 04/01/2017 0318   KETONESUR NEGATIVE 04/01/2017 0318   PROTEINUR 30 (A) 04/01/2017 0318   UROBILINOGEN 0.2 09/01/2009 1138   NITRITE NEGATIVE 04/01/2017 0318   LEUKOCYTESUR NEGATIVE 04/01/2017 0318   Sepsis Labs: @LABRCNTIP (procalcitonin:4,lacticidven:4) )No results found for this or any previous visit (from the past 240 hour(s)).   Radiological Exams on Admission: No results found.  EKG: Not performed.   Assessment/Plan  1. Hypoglycemia, type II DM  - Pt presents with sweats, tremor, and persistently low CBG's at home  - She had adjustments made to her medications 1 wk ago and changed her diet after an elective surgery was canceled due to hyperglycemia  - A1c was 8.2% in July 2018  - She is now taking glipizide, Januvia, metformin, and prn insulin  - She has been symptomatic the last 2-3 days and reports  CBG's consistently in 50's  - She was treated with 1 ampule 50% dextrose in ED for a glucose of 47  - Plan to check frequent CBG's, hold her anti-diabetic medication, treat hypoglycemia prn  - May need to stop glipizide, or have dose-reductions    2. Hypertension with hypertensive urgency  - BP 200/100 in ED without sxs  - Continue lisinopril, hydralazine, clonidine, and Lasix  - Use hydralazine IVP's prn    3. Chronic diastolic CHF  - Appears euvolemic on admission  - Continue Lasix 80 mg qD, lisinopril   - Follow daily wts and I/O's    4. CKD stage III  - SCr is 1.47, similar to prior  - Avoid dehydration, renally-dose medications as needed  5. History of DVT  - No evidence for acute VTE  - INR pending - Continue warfarin with pharmacy assistance    DVT prophylaxis: warfarin  Code Status: Full  Family Communication: Discussed with patient Disposition Plan: Observe on med-surg Consults called: None Admission status: Observation    Briscoe Deutscherimothy S Shoua Ressler, MD [facility] Pager [phone]  If 7PM-7AM, please contact night-coverage [email or web address] Password [email or web address]  04/01/2017, 3:34 AM

## 2017-04-01 NOTE — Progress Notes (Signed)
ANTICOAGULATION CONSULT NOTE - Initial Consult  Pharmacy Consult for warfarin Indication: DVT  Allergies  Allergen Reactions  . Blood-Group Specific Substance     Pt refuses blood      Vital Signs: Temp: 98.1 F (36.7 C) (08/07 0126) Temp Source: Oral (08/07 0126) BP: 204/103 (08/07 0126) Pulse Rate: 60 (08/07 0126)  Labs:  Recent Labs  04/01/17 0150  HGB 11.2*  HCT 34.6*  PLT 272  CREATININE 1.47*    CrCl cannot be calculated (Unknown ideal weight.).   Medical History: Past Medical History:  Diagnosis Date  . Arthritis   . Diabetes mellitus without complication (HCC)   . DVT (deep venous thrombosis) (HCC)   . Hypertension   . Lymphedema of lower extremity     Medications:  Scheduled:  . aspirin EC  81 mg Oral Daily  . atorvastatin  20 mg Oral QHS  . cloNIDine  0.2 mg Oral TID  . ferrous sulfate  325 mg Oral Q breakfast  . furosemide  80 mg Oral Daily  . hydrALAZINE  100 mg Oral QID  . insulin aspart  0-5 Units Subcutaneous Q4H  . isosorbide mononitrate  30 mg Oral Daily  . lisinopril  40 mg Oral Daily  . potassium chloride SA  20 mEq Oral Daily  . senna-docusate  1 tablet Oral Daily  . sodium chloride flush  3 mL Intravenous Q12H   Infusions:  . sodium chloride      Assessment: [age] yoM with hx of DVT admitted with low blood sugar.  Warfarin HD= 5 mg alternating with 10 mg.  LD 8/5 > 10 mg.  INR=1.44 on admission.   Goal of Therapy:  INR 2-3    Plan:  Daily PT/INR Warfarin 5 mg x1 this am   Lorenza EvangelistGreen, Toia Micale R 04/01/2017,3:45 AM

## 2017-04-01 NOTE — ED Notes (Signed)
Admitting MD at the bedside.  

## 2017-04-01 NOTE — ED Provider Notes (Signed)
[facility]-EMERGENCY DEPT Provider Note   CSN: [id number] Arrival date & time: 04/01/17  0112     History   Chief Complaint Chief Complaint  Patient presents with  . Hypoglycemia    HPI Vickie Pham is a 81 y.o. female.  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 She reports her blood sugar started to trend down 4 days ago and has continued to get to lower and lower levels, she reports into the 40's. She has been lightheaded, and having nausea without vomiting. No syncope, chest pain, SOB. She reports minimal diarrhea. She reports that her diabetic medications have been changed recently after hyperglycemia prevented an elective hip replacement last month. She states she started Januvia last Wednesday (6 days ago) and has taken it daily until yesterday (03/31/17).    The history is provided by the patient. No language interpreter was used.    Past Medical History:  Diagnosis Date  . Arthritis   . Diabetes mellitus without complication (HCC)   . DVT (deep venous thrombosis) (HCC)   . Hypertension   . Lymphedema of lower extremity     Patient Active Problem List   Diagnosis Date Noted  . DYSPNEA 04/04/2010  . DIABETIC  RETINOPATHY 02/02/2010  . ANEMIA 12/28/2009  . MICROALBUMINURIA 12/08/2009  . CERUMEN IMPACTION 12/07/2009  . VENTRAL HERNIA WITH INCARCERATION 11/08/2009  . VITAMIN D DEFICIENCY 09/01/2009  . PERIUMBILICAL HERNIA 09/01/2009  . KNEE PAIN 09/01/2009  . URINALYSIS, ABNORMAL 09/01/2009  . HEART MURMUR, SYSTOLIC 02/28/2009  . VIRAL INFECTION 08/16/2008  . LEG PAIN, BILATERAL 05/25/2008  . UNSPECIFIED VENOUS INSUFFICIENCY 09/09/2007  . DIABETES MELLITUS, TYPE II 04/10/2007  . DIABETES MELLITUS, TYPE II, WITH NEUROLOGICAL COMPLICATIONS 04/10/2007  . DYSLIPIDEMIA 04/10/2007  . RETINOPATHY, DIABETIC, BACKGROUND 04/10/2007  . HYPERTENSION 04/10/2007  . EDEMA 04/10/2007  . DVT, HX OF 04/10/2007  . GASTRIC  POLYP, HX OF 04/10/2007  . HERNIORRHAPHY, HX OF 04/10/2007  . GI BLEEDING 07/25/2006    Past Surgical History:  Procedure Laterality Date  . ABDOMINAL HYSTERECTOMY    . CHOLECYSTECTOMY      OB History    No data available       Home Medications    Prior to Admission medications   Medication Sig Start Date End Date Taking? Authorizing Provider  aspirin EC 81 MG tablet Take 81 mg by mouth daily.   Yes [provider]  atorvastatin (LIPITOR) 20 MG tablet Take 20 mg by mouth at bedtime.   Yes [provider]  cloNIDine (CATAPRES) 0.2 MG tablet Take 0.2 mg by mouth 3 (three) times daily.   Yes [provider]  ferrous sulfate 325 (65 FE) MG tablet Take 325 mg by mouth daily with breakfast.   Yes [provider]  furosemide (LASIX) 40 MG tablet Take 80 mg by mouth daily.    Yes [provider]  glipiZIDE (GLUCOTROL) 10 MG tablet Take 10 mg by mouth 2 (two) times daily before a meal.   Yes [provider]  hydrALAZINE (APRESOLINE) 100 MG tablet Take 100 mg by mouth 4 (four) times daily.   Yes [provider]  HYDROcodone-acetaminophen (NORCO/VICODIN) 5-325 MG tablet Take 1 tablet by mouth every 6 (six) hours as needed for moderate pain.  03/25/17  Yes [provider]  isosorbide mononitrate (IMDUR) 30 MG 24 hr tablet Take 30 mg by mouth daily.   Yes [provider]  lisinopril (PRINIVIL,ZESTRIL) 40 MG tablet Take  40 mg by mouth daily.   Yes [provider]  metFORMIN (GLUCOPHAGE) 1000 MG tablet Take 1,000 mg by mouth 2 (two) times daily with a meal.  03/21/17  Yes [provider]  potassium chloride SA (K-DUR,KLOR-CON) 20 MEQ tablet Take 20 mEq by mouth daily.   Yes [provider]  senna-docusate (SENOKOT-S) 8.6-50 MG tablet Take 1 tablet by mouth daily.   Yes [provider]  sitaGLIPtin (JANUVIA) 100 MG tablet Take 100 mg by mouth daily.   Yes [provider]    warfarin (COUMADIN) 5 MG tablet Take 5-10 mg by mouth every evening. Alternates taking one tablet and two tablets each day.   Yes [provider]    Family History Family History  Problem Relation Age of Onset  . Breast cancer Neg Hx     Social History Social History  Substance Use Topics  . Smoking status: Former Smoker    Types: Cigarettes    Quit date: 12/30/1969  . Smokeless tobacco: Never Used  . Alcohol use No     Allergies   Blood-group specific substance   Review of Systems Review of Systems  Constitutional: Positive for diaphoresis. Negative for chills and fever.  HENT: Negative.   Respiratory: Negative.  Negative for shortness of breath.   Cardiovascular: Negative.  Negative for chest pain.  Gastrointestinal: Positive for nausea. Negative for vomiting.  Musculoskeletal: Negative.   Skin: Negative.   Neurological: Positive for weakness and light-headedness.     Physical Exam Updated Vital Signs BP (!) 204/103 (BP Location: Right Arm)   Pulse 60   Temp 98.1 F (36.7 C) (Oral)   Resp 18   SpO2 94%   Physical Exam  Constitutional: She is oriented to person, place, and time. She appears well-developed and well-nourished.  HENT:  Head: Normocephalic.  Neck: Normal range of motion. Neck supple.  Cardiovascular: Normal rate and regular rhythm.   Pulmonary/Chest: Effort normal and breath sounds normal. She has no wheezes. She has no rales.  Abdominal: Soft. Bowel sounds are normal. There is no tenderness. There is no rebound and no guarding.  Musculoskeletal: Normal range of motion. She exhibits no edema.  Neurological: She is alert and oriented to person, place, and time.  Skin: Skin is warm and dry. No rash noted. She is not diaphoretic.  Psychiatric: She has a normal mood and affect.     ED Treatments / Results  Labs (all labs ordered are listed, but only abnormal results are displayed) Labs Reviewed  COMPREHENSIVE METABOLIC PANEL -  Abnormal; Notable for the following:       Result Value   Glucose, Bld 55 (*)    BUN 42 (*)    Creatinine, Ser 1.47 (*)    GFR calc non Af Amer 33 (*)    GFR calc Af Amer 38 (*)    All other components within normal limits  CBC - Abnormal; Notable for the following:    Hemoglobin 11.2 (*)    HCT 34.6 (*)    RDW 16.5 (*)    All other components within normal limits  CBG MONITORING, ED - Abnormal; Notable for the following:    Glucose-Capillary 47 (*)    All other components within normal limits  LIPASE, BLOOD  CBG MONITORING, ED    EKG  EKG Interpretation None       Radiology No results found.  Procedures Procedures (including critical care time)  Medications Ordered in ED Medications  dextrose 50 %  solution 50 mL (50 mLs Intravenous Given 04/01/17 0200)     Initial Impression / Assessment and Plan / ED Course  I have reviewed the triage vital signs and the nursing notes.  Pertinent labs & imaging results that were available during my care of the patient were reviewed by me and considered in my medical decision making (see chart for details).     Patient with hypoglycemia in the setting of recent diabetic medication regimen. She started Januvia 5-6 days ago and noticed her blood sugar trending downward 4 days ago. She continued this medication until yesterday.   Per pharmacy, Januvia has a 12 hour half-life, up to 19% higher in elderly. Also discussed was her dose of 100 mg daily, which is high when considering her decreased creatinine clearance.   Blood sugar will require observation over the next 24 hours and medications will need adjustment. Discussed with [facility], Dr. Antionette Char, who accepts the patient for admission.  Final Clinical Impressions(s) / ED Diagnoses   Final diagnoses:  None   1. Hypoglycemia   New Prescriptions New Prescriptions   No medications on file     Danne Harbor 04/01/17 2326    Devoria Albe, MD 04/04/17 2255

## 2017-04-01 NOTE — Progress Notes (Signed)
  PROGRESS NOTE  Patient admitted earlier this morning. See H&P. Patient with history of uncontrolled diabetes, recently medications were adjusted. For the past couple of days, her blood sugar has been in the 50s at home with tremors, sweats, clamminess. Patient was admitted secondary to hypoglycemia, treated with dextrose. Her blood sugars this morning are much improved. She states that her symptoms have now resolved.   Discontinue D5 and monitor blood sugar, will need medication adjustment prior to discharge BP much improved this morning, monitor  IVF for AKI. Hold lasix and lisinopril  If blood sugars remain controlled without hypoglycemic or hyperglycemic episodes, plan for discharge home tomorrow  Noralee StainJennifer Nester Bachus, DO [facility] [email or web address] Password [email or web address] 04/01/2017, 12:31 PM

## 2017-04-01 NOTE — ED Triage Notes (Signed)
Pt presented to triage w/ BSG of 47. Pt reports multiple CBG readings at home in the 50s. Pt A+OX4, w/ tremors.

## 2017-04-01 NOTE — Progress Notes (Signed)
ANTICOAGULATION CONSULT NOTE  Pharmacy Consult for warfarin Indication: DVT  Allergies  Allergen Reactions  . Blood-Group Specific Substance     Pt refuses blood      Vital Signs: Temp: 98.3 F (36.8 C) (08/07 0405) Temp Source: Oral (08/07 0405) BP: 148/70 (08/07 0530) Pulse Rate: 83 (08/07 0530)  Labs:  Recent Labs  04/01/17 0150 04/01/17 0330  HGB 11.2*  --   HCT 34.6*  --   PLT 272  --   LABPROT  --  17.7*  INR  --  1.44  CREATININE 1.47*  --     Estimated Creatinine Clearance: 40.3 mL/min (A) (by C-G formula based on SCr of 1.47 mg/dL (H)).  Medications:  Scheduled:  . aspirin EC  81 mg Oral Daily  . atorvastatin  20 mg Oral QHS  . cloNIDine  0.2 mg Oral TID  . ferrous sulfate  325 mg Oral Q breakfast  . furosemide  80 mg Oral Daily  . hydrALAZINE  100 mg Oral QID  . insulin aspart  0-5 Units Subcutaneous Q4H  . isosorbide mononitrate  30 mg Oral Daily  . lisinopril  40 mg Oral Daily  . potassium chloride SA  20 mEq Oral Daily  . senna-docusate  1 tablet Oral Daily  . sodium chloride flush  3 mL Intravenous Q12H  . Warfarin - Pharmacist Dosing Inpatient   Does not apply q1800   Infusions:  . sodium chloride    . dextrose 5 % and 0.9% NaCl 50 mL/hr at 04/01/17 0553    Assessment: [age] yoM with hx of DVT admitted with low blood sugar.  Warfarin HD= 5 mg alternating with 10 mg.  LD 8/5 > 10 mg.  INR=1.44 on admission.    Baseline INR subtherapeutic  Prior anticoagulation: warfarin 5 mg alternating with 10 mg, last dose 8/5 was 10 mg  Significant events:  Today, 04/01/2017:  CBC: hgb slightly low, Plt wnl  INR subtherapeutic  Major drug interactions: none noted  No bleeding issues per nursing  Carb diet ordered  Warfarin already given this AM per Rx consult  Goal of Therapy: INR 2-3  Plan:  Will give an additional 5 mg of warfarin this afternoon for a total dose of 10 mg today, since INR subtherapeutic on admit, no evidence of reduced  intake or vomiting/malnutrition, and missed yesterday's dose  Daily INR  CBC at least q72 hr while on warfarin  Monitor for signs of bleeding or thrombosis   Bernadene Personrew Cordarrel Stiefel, PharmD Pager: [phone] 04/01/2017, 11:14 AM

## 2017-04-01 NOTE — ED Notes (Signed)
Bed: WA14 Expected date:  Expected time:  Means of arrival:  Comments: 

## 2017-04-02 DIAGNOSIS — E11649 Type 2 diabetes mellitus with hypoglycemia without coma: Secondary | ICD-10-CM | POA: Diagnosis not present

## 2017-04-02 LAB — CBC
HEMATOCRIT: 32.3 % — AB (ref 36.0–46.0)
Hemoglobin: 10.3 g/dL — ABNORMAL LOW (ref 12.0–15.0)
MCH: 27 pg (ref 26.0–34.0)
MCHC: 31.9 g/dL (ref 30.0–36.0)
MCV: 84.8 fL (ref 78.0–100.0)
PLATELETS: 254 10*3/uL (ref 150–400)
RBC: 3.81 MIL/uL — AB (ref 3.87–5.11)
RDW: 16.6 % — AB (ref 11.5–15.5)
WBC: 5.5 10*3/uL (ref 4.0–10.5)

## 2017-04-02 LAB — BASIC METABOLIC PANEL
Anion gap: 10 (ref 5–15)
BUN: 40 mg/dL — AB (ref 6–20)
CO2: 23 mmol/L (ref 22–32)
Calcium: 9 mg/dL (ref 8.9–10.3)
Chloride: 107 mmol/L (ref 101–111)
Creatinine, Ser: 1.42 mg/dL — ABNORMAL HIGH (ref 0.44–1.00)
GFR calc Af Amer: 40 mL/min — ABNORMAL LOW (ref >60)
GFR, EST NON AFRICAN AMERICAN: 34 mL/min — AB (ref >60)
GLUCOSE: 201 mg/dL — AB (ref 65–99)
POTASSIUM: 4 mmol/L (ref 3.5–5.1)
Sodium: 140 mmol/L (ref 135–145)

## 2017-04-02 LAB — GLUCOSE, CAPILLARY
Glucose-Capillary: 192 mg/dL — ABNORMAL HIGH (ref 65–99)
Glucose-Capillary: 241 mg/dL — ABNORMAL HIGH (ref 65–99)

## 2017-04-02 LAB — PROTIME-INR
INR: 1.45
Prothrombin Time: 17.8 seconds — ABNORMAL HIGH (ref 11.4–15.2)

## 2017-04-02 LAB — URINE CULTURE: CULTURE: NO GROWTH

## 2017-04-02 MED ORDER — SITAGLIPTIN PHOSPHATE 50 MG PO TABS: 50.0000 mg | ORAL_TABLET | Freq: Every day | ORAL | 0 refills | Status: AC

## 2017-04-02 MED ORDER — METFORMIN HCL 500 MG PO TABS: 500.0000 mg | ORAL_TABLET | Freq: Two times a day (BID) | ORAL | 0 refills | Status: AC

## 2017-04-02 NOTE — Discharge Summary (Signed)
[facility]  Physician Discharge Summary   Patient ID: Vickie Pham MRN: [id number] DOB/AGE: 1935/10/25 81 y.o.  Admit date: 04/01/2017 Discharge date: 04/02/2017  PCP: Rometta Emery, MD  DISCHARGE DIAGNOSES:  Principal Problem:   Type 2 diabetes mellitus with hypoglycemia without coma (HCC) Active Problems:   Diabetes mellitus, type II, insulin dependent (HCC)   ANEMIA   Essential hypertension   DVT, HX OF   Chronic diastolic CHF (congestive heart failure) (HCC)   CKD (chronic kidney disease), stage III   Hypoglycemia   Hypertensive urgency   RECOMMENDATIONS FOR OUTPATIENT FOLLOW UP: 1. Close monitoring of CBGs at home. 2. Please note medication changes.   DISCHARGE CONDITION: fair  Diet recommendation: Mod Carb  Filed Weights   04/01/17 0405  Weight: 110 kg (242 lb 8.1 oz)    INITIAL HISTORY: 81 year old with a past medical history of Diabetes, hypertension, history of DVT on warfarin, chronic diastolic CHF, presented with hypoglycemia. Her medication regimen was adjusted a few days ago and she started adhering to mod carbohydrate diet.  Consultations:  None  Procedures:  None   HOSPITAL COURSE:   Hypoglycemia This could likely due to adjustments to her medications about a week ago. She also changed her diet recently. At home, patient was taking glipizide and metformin previously. Januvia and when necessary insulin was added recently. Her last HbA1c was 8.2 in July. Patient was given dextrose in the emergency department with improvement. She was initially kept on a D5 infusion, which was discontinued. CBGs have been stable for the last 24 hours. Glipizide will be discontinued. She will be asked to resume half dose of Januvia and metformin. Monitor CBGs closely and then increase to her previous doses depending on her CBGs. She will need to discuss this further with her PCP. Patient understands these instructions.  Blood  pressure was poorly controlled  when she was admitted. She was started back on her home medications. Blood pressures have improved. Will need further monitoring as outpatient.  Rest of medical issues including chronic diastolic CHF, chronic kidney disease stage III , are stable.  She has a remote history of DVT and remains on warfarin. INR is subtherapeutic. Continue warfarin and follow up with primary Provider. No clear indication for bridging treatment as this was a remote DVT.  Overall, stable. Wants to go home. Okay for discharge.    PERTINENT LABS:  The results of significant diagnostics from this hospitalization (including imaging, microbiology, ancillary and laboratory) are listed below for reference.    Microbiology: Recent Results (from the past 240 hour(s))  Urine culture     Status: None   Collection Time: 04/01/17  3:18 AM  Result Value Ref Range Status   Specimen Description URINE, CLEAN CATCH  Final   Special Requests NONE  Final   Culture   Final    NO GROWTH Performed at [facility], [address], [place], [place] [address]    Report Status 04/02/2017 FINAL  Final     Labs: Basic Metabolic Panel:  Recent Labs Lab 04/01/17 0150 04/02/17 0720  NA 141 140  K 3.9 4.0  CL 108 107  CO2 22 23  GLUCOSE 55* 201*  BUN 42* 40*  CREATININE 1.47* 1.42*  CALCIUM 9.3 9.0   Liver Function Tests:  Recent Labs Lab 04/01/17 0150  AST 22  ALT 17  ALKPHOS 61  BILITOT 0.4  PROT 7.6  ALBUMIN 4.2    Recent Labs Lab 04/01/17 0150  LIPASE  42   CBC:  Recent Labs Lab 04/01/17 0150 04/02/17 0720  WBC 8.1 5.5  HGB 11.2* 10.3*  HCT 34.6* 32.3*  MCV 85.4 84.8  PLT 272 254   CBG:  Recent Labs Lab 04/01/17 1146 04/01/17 1648 04/01/17 2041 04/02/17 0739 04/02/17 1156  GLUCAP 143* 161* 175* 192* 241*     IMAGING STUDIES No results found.  DISCHARGE EXAMINATION: Vitals:   04/01/17 1429 04/01/17 2043 04/02/17 0757 04/02/17 1144  BP: (!) 191/64 (!) 176/75 (!) 187/67 (!)  173/64  Pulse: (!) 59 (!) 50  (!) 50  Resp: 20 18 16 16   Temp: 97.9 F (36.6 C) 97.9 F (36.6 C)    TempSrc: Oral Oral    SpO2: 96% 96%  100%  Weight:      Height:       General appearance: alert, cooperative, appears stated age and no distress Resp: clear to auscultation bilaterally Cardio: regular rate and rhythm, S1, S2 normal, no murmur, click, rub or gallop GI: soft, non-tender; bowel sounds normal; no masses,  no organomegaly  DISPOSITION: Home  Discharge Instructions    Call MD for:  difficulty breathing, headache or visual disturbances    Complete by:  As directed    Call MD for:  extreme fatigue    Complete by:  As directed    Call MD for:  persistant dizziness or light-headedness    Complete by:  As directed    Call MD for:  persistant nausea and vomiting    Complete by:  As directed    Call MD for:  severe uncontrolled pain    Complete by:  As directed    Call MD for:  temperature >100.4    Complete by:  As directed    Diet Carb Modified    Complete by:  As directed    Discharge instructions    Complete by:  As directed    Please note changes made to your diabetes medications. Monitor your blood glucose levels closely. Keep a log for your primary care physician. Check it 3 times a day before each meal. If your blood glucose levels stay above 180 consistently, then you may increase metformin back to usual home dose. And then further instructions per your primary care provider when you see him at follow-up the week.  You were cared for by a hospitalist during your hospital stay. If you have any questions about your discharge medications or the care you received while you were in the hospital after you are discharged, you can call the unit and asked to speak with the hospitalist on call if the hospitalist that took care of you is not available. Once you are discharged, your primary care physician will handle any further medical issues. Please note that NO REFILLS for any  discharge medications will be authorized once you are discharged, as it is imperative that you return to your primary care physician (or establish a relationship with a primary care physician if you do not have one) for your aftercare needs so that they can reassess your need for medications and monitor your lab values. If you do not have a primary care physician, you can call [phone] for a physician referral.   Increase activity slowly    Complete by:  As directed       ALLERGIES:  Allergies  Allergen Reactions  . Blood-Group Specific Substance     Pt refuses blood     Discharge Medication List as of 04/02/2017 12:51 PM  CONTINUE these medications which have CHANGED   Details  metFORMIN (GLUCOPHAGE) 500 MG tablet Take 1 tablet (500 mg total) by mouth 2 (two) times daily with a meal., Starting Wed 04/02/2017, Print    sitaGLIPtin (JANUVIA) 50 MG tablet Take 1 tablet (50 mg total) by mouth daily., Starting Wed 04/02/2017, Print      CONTINUE these medications which have NOT CHANGED   Details  aspirin EC 81 MG tablet Take 81 mg by mouth daily., Historical Med    atorvastatin (LIPITOR) 20 MG tablet Take 20 mg by mouth at bedtime., Until Discontinued, Historical Med    cloNIDine (CATAPRES) 0.2 MG tablet Take 0.2 mg by mouth 3 (three) times daily., Historical Med    ferrous sulfate 325 (65 FE) MG tablet Take 325 mg by mouth daily with breakfast., Historical Med    furosemide (LASIX) 40 MG tablet Take 80 mg by mouth daily. , Historical Med    hydrALAZINE (APRESOLINE) 100 MG tablet Take 100 mg by mouth 4 (four) times daily., Historical Med    HYDROcodone-acetaminophen (NORCO/VICODIN) 5-325 MG tablet Take 1 tablet by mouth every 6 (six) hours as needed for moderate pain. , Starting Tue 03/25/2017, Historical Med    isosorbide mononitrate (IMDUR) 30 MG 24 hr tablet Take 30 mg by mouth daily., Historical Med    lisinopril (PRINIVIL,ZESTRIL) 40 MG tablet Take 40 mg by mouth daily.,  Historical Med    potassium chloride SA (K-DUR,KLOR-CON) 20 MEQ tablet Take 20 mEq by mouth daily., Until Discontinued, Historical Med    senna-docusate (SENOKOT-S) 8.6-50 MG tablet Take 1 tablet by mouth daily., Historical Med    warfarin (COUMADIN) 5 MG tablet Take 5-10 mg by mouth every evening. Alternates taking one tablet and two tablets each day., Historical Med      STOP taking these medications     glipiZIDE (GLUCOTROL) 10 MG tablet          Follow-up Information    Rometta Emery, MD. Schedule an appointment as soon as possible for a visit in 1 week(s).   Specialty:  Internal Medicine Contact information: [address] [place] [address] [phone]           TOTAL DISCHARGE TIME: 35 mins  [facility]  [facility] Pager [phone]  04/02/2017, 6:40 PM

## 2017-04-02 NOTE — Discharge Instructions (Signed)
Hypoglycemia Hypoglycemia is when the sugar (glucose) level in the blood is too low. Symptoms of low blood sugar may include:  Feeling: ? Hungry. ? Worried or nervous (anxious). ? Sweaty and clammy. ? Confused. ? Dizzy. ? Sleepy. ? Sick to your stomach (nauseous).  Having: ? A fast heartbeat. ? A headache. ? A change in your vision. ? Jerky movements that you cannot control (seizure). ? Nightmares. ? Tingling or no feeling (numbness) around the mouth, lips, or tongue.  Having trouble with: ? Talking. ? Paying attention (concentrating). ? Moving (coordination). ? Sleeping.  Shaking.  Passing out (fainting).  Getting upset easily (irritability).  Low blood sugar can happen to people who have diabetes and people who do not have diabetes. Low blood sugar can happen quickly, and it can be an emergency. Treating Low Blood Sugar Low blood sugar is often treated by eating or drinking something sugary right away. If you can think clearly and swallow safely, follow the 15:15 rule:  Take 15 grams of a fast-acting carb (carbohydrate). Some fast-acting carbs are: ? 1 tube of glucose gel. ? 3 sugar tablets (glucose pills). ? 6-8 pieces of hard candy. ? 4 oz (120 mL) of fruit juice. ? 4 oz (120 mL) of regular (not diet) soda.  Check your blood sugar 15 minutes after you take the carb.  If your blood sugar is still at or below 70 mg/dL (3.9 mmol/L), take 15 grams of a carb again.  If your blood sugar does not go above 70 mg/dL (3.9 mmol/L) after 3 tries, get help right away.  After your blood sugar goes back to normal, eat a meal or a snack within 1 hour.  Treating Very Low Blood Sugar If your blood sugar is at or below 54 mg/dL (3 mmol/L), you have very low blood sugar (severe hypoglycemia). This is an emergency. Do not wait to see if the symptoms will go away. Get medical help right away. Call your local emergency services (911 in the [place] Do not drive yourself to the  hospital. If you have very low blood sugar and you cannot eat or drink, you may need a glucagon shot (injection). A family member or friend should learn how to check your blood sugar and how to give you a glucagon shot. Ask your doctor if you need to have a glucagon shot kit at home. Follow these instructions at home: General instructions  Avoid any diets that cause you to not eat enough food. Talk with your doctor before you start any new diet.  Take over-the-counter and prescription medicines only as told by your doctor.  Limit alcohol to no more than 1 drink per day for nonpregnant women and 2 drinks per day for men. One drink equals 12 oz of beer, 5 oz of wine, or 1 oz of hard liquor.  Keep all follow-up visits as told by your doctor. This is important. If You Have Diabetes:   Make sure you know the symptoms of low blood sugar.  Always keep a source of sugar with you, such as: ? Sugar. ? Sugar tablets. ? Glucose gel. ? Fruit juice. ? Regular soda (not diet soda). ? Milk. ? Hard candy. ? Honey.  Take your medicines as told.  Follow your exercise and meal plan. ? Eat on time. Do not skip meals. ? Follow your sick day plan when you cannot eat or drink normally. Make this plan ahead of time with your doctor.  Check your blood sugar as often  as told by your doctor. Always check before and after exercise.  Share your diabetes care plan with: ? Your work or school. ? People you live with.  Check your pee (urine) for ketones: ? When you are sick. ? As told by your doctor.  Carry a card or wear jewelry that says you have diabetes. If You Have Low Blood Sugar From Other Causes:   Check your blood sugar as often as told by your doctor.  Follow instructions from your doctor about what you cannot eat or drink. Contact a doctor if:  You have trouble keeping your blood sugar in your target range.  You have low blood sugar often. Get help right away if:  You still have  symptoms after you eat or drink something sugary.  Your blood sugar is at or below 54 mg/dL (3 mmol/L).  You have jerky movements that you cannot control.  You pass out. These symptoms may be an emergency. Do not wait to see if the symptoms will go away. Get medical help right away. Call your local emergency services (911 in the [place] Do not drive yourself to the hospital. This information is not intended to replace advice given to you by your health care provider. Make sure you discuss any questions you have with your health care provider. Document Released: 11/06/2009 Document Revised: 01/18/2016 Document Reviewed: 09/15/2015 Elsevier Interactive Patient Education  [employer].

## 2017-04-02 NOTE — Progress Notes (Signed)
PT Screen Note  Patient Details Name: Mertie ClauseCarol Peeks MRN: [id number] DOB: 12/21/1935   Cancelled Treatment:    Reason Eval/Treat Not Completed: PT screened, no needs identified, will sign off   Rebeca AlertJannie Lamyah Creed, MPT Pager: [phone]

## 2017-04-02 NOTE — Progress Notes (Signed)
Date: April 01, 2017 Chart reviewed for discharge orders: None found for case management. Rhonda Davis,BSN,RN3, CCM/[phone] 

## 2017-07-08 ENCOUNTER — Emergency Department (HOSPITAL_COMMUNITY): Payer: Medicare Other

## 2017-07-08 ENCOUNTER — Emergency Department (HOSPITAL_COMMUNITY)
Admission: EM | Admit: 2017-07-08 | Discharge: 2017-07-09 | Disposition: A | Discharge status: Home / Self Care | Payer: Medicare Other | Attending: Emergency Medicine | Admitting: Emergency Medicine

## 2017-07-08 ENCOUNTER — Encounter (HOSPITAL_COMMUNITY): Payer: Self-pay | Admitting: Emergency Medicine

## 2017-07-08 DIAGNOSIS — Z87891 Personal history of nicotine dependence: Secondary | ICD-10-CM | POA: Diagnosis not present

## 2017-07-08 DIAGNOSIS — Z7984 Long term (current) use of oral hypoglycemic drugs: Secondary | ICD-10-CM | POA: Diagnosis not present

## 2017-07-08 DIAGNOSIS — M79604 Pain in right leg: Secondary | ICD-10-CM | POA: Insufficient documentation

## 2017-07-08 DIAGNOSIS — N183 Chronic kidney disease, stage 3 (moderate): Secondary | ICD-10-CM | POA: Insufficient documentation

## 2017-07-08 DIAGNOSIS — I5032 Chronic diastolic (congestive) heart failure: Secondary | ICD-10-CM | POA: Insufficient documentation

## 2017-07-08 DIAGNOSIS — Z79899 Other long term (current) drug therapy: Secondary | ICD-10-CM | POA: Insufficient documentation

## 2017-07-08 DIAGNOSIS — I130 Hypertensive heart and chronic kidney disease with heart failure and stage 1 through stage 4 chronic kidney disease, or unspecified chronic kidney disease: Secondary | ICD-10-CM | POA: Diagnosis not present

## 2017-07-08 DIAGNOSIS — E1122 Type 2 diabetes mellitus with diabetic chronic kidney disease: Secondary | ICD-10-CM | POA: Diagnosis not present

## 2017-07-08 DIAGNOSIS — M25551 Pain in right hip: Secondary | ICD-10-CM | POA: Diagnosis not present

## 2017-07-08 DIAGNOSIS — Z7982 Long term (current) use of aspirin: Secondary | ICD-10-CM | POA: Insufficient documentation

## 2017-07-08 DIAGNOSIS — M7918 Myalgia, other site: Secondary | ICD-10-CM

## 2017-07-08 DIAGNOSIS — Z7901 Long term (current) use of anticoagulants: Secondary | ICD-10-CM | POA: Insufficient documentation

## 2017-07-08 DIAGNOSIS — W19XXXA Unspecified fall, initial encounter: Secondary | ICD-10-CM

## 2017-07-08 MED ORDER — ACETAMINOPHEN 500 MG PO TABS
1000.0000 mg | ORAL_TABLET | Freq: Once | ORAL | Status: AC
  Administered 2017-07-08: 1000 mg via ORAL
  Filled 2017-07-08: qty 2

## 2017-07-08 MED ORDER — LIDOCAINE 5 % EX PTCH
2.0000 | MEDICATED_PATCH | CUTANEOUS | Status: DC
  Administered 2017-07-08: 2 via TRANSDERMAL
  Filled 2017-07-08 (×2): qty 2

## 2017-07-08 NOTE — ED Provider Notes (Signed)
[facility]-EMERGENCY DEPT Provider Note   CSN: [id number] Arrival date & time: 07/08/17  1809     History   Chief Complaint Chief Complaint  Patient presents with  . Hip Pain    right   . Leg Pain    HPI Vickie Pham is a 81 y.o. female.  The history is provided by the patient.  Hip Pain  This is a chronic problem. The current episode started more than 1 week ago. The problem occurs constantly. The problem has been rapidly worsening (since fall at 7 am ). Pertinent negatives include no chest pain, no abdominal pain, no headaches and no shortness of breath. Nothing aggravates the symptoms. Nothing relieves the symptoms. Treatments tried: tramadol. The treatment provided no relief.  Leg Pain    Fell onto bottom did not strike head no LOC.  No rotation, no foreshortening.  Has chronic pain and uses walker and now pain is worse.    Past Medical History:  Diagnosis Date  . Arthritis   . Diabetes mellitus without complication (HCC)   . DVT (deep venous thrombosis) (HCC)   . Hypertension   . Lymphedema of lower extremity     Patient Active Problem List   Diagnosis Date Noted  . Chronic diastolic CHF (congestive heart failure) (HCC) 04/01/2017  . CKD (chronic kidney disease), stage III (HCC) 04/01/2017  . Hypoglycemia 04/01/2017  . Hypertensive urgency   . DYSPNEA 04/04/2010  . DIABETIC  RETINOPATHY 02/02/2010  . ANEMIA 12/28/2009  . MICROALBUMINURIA 12/08/2009  . CERUMEN IMPACTION 12/07/2009  . VENTRAL HERNIA WITH INCARCERATION 11/08/2009  . VITAMIN D DEFICIENCY 09/01/2009  . PERIUMBILICAL HERNIA 09/01/2009  . KNEE PAIN 09/01/2009  . URINALYSIS, ABNORMAL 09/01/2009  . HEART MURMUR, SYSTOLIC 02/28/2009  . VIRAL INFECTION 08/16/2008  . LEG PAIN, BILATERAL 05/25/2008  . UNSPECIFIED VENOUS INSUFFICIENCY 09/09/2007  . Type 2 diabetes mellitus with hypoglycemia without coma (HCC) 04/10/2007  . Diabetes mellitus, type II, insulin dependent (HCC)  04/10/2007  . DYSLIPIDEMIA 04/10/2007  . RETINOPATHY, DIABETIC, BACKGROUND 04/10/2007  . Essential hypertension 04/10/2007  . EDEMA 04/10/2007  . DVT, HX OF 04/10/2007  . GASTRIC POLYP, HX OF 04/10/2007  . HERNIORRHAPHY, HX OF 04/10/2007  . GI BLEEDING 07/25/2006    Past Surgical History:  Procedure Laterality Date  . ABDOMINAL HYSTERECTOMY    . CHOLECYSTECTOMY      OB History    No data available       Home Medications    Prior to Admission medications   Medication Sig Start Date End Date Taking? Authorizing Provider  aspirin EC 81 MG tablet Take 81 mg by mouth daily.   Yes [provider]  atorvastatin (LIPITOR) 20 MG tablet Take 20 mg by mouth at bedtime.   Yes [provider]  cloNIDine (CATAPRES) 0.2 MG tablet Take 0.2 mg by mouth 3 (three) times daily.   Yes [provider]  furosemide (LASIX) 40 MG tablet Take 80 mg by mouth daily.    Yes [provider]  hydrALAZINE (APRESOLINE) 100 MG tablet Take 100 mg by mouth 4 (four) times daily.   Yes [provider]  isosorbide mononitrate (IMDUR) 30 MG 24 hr tablet Take 30 mg at bedtime by mouth.    Yes [provider]  lisinopril (PRINIVIL,ZESTRIL) 40 MG tablet Take 40 mg by mouth daily.   Yes [provider]  metFORMIN (GLUCOPHAGE) 500 MG tablet Take 1 tablet (500 mg total) by mouth 2 (two) times daily with  a meal. 04/02/17  Yes Osvaldo Shipper, MD  potassium chloride SA (K-DUR,KLOR-CON) 20 MEQ tablet Take 20 mEq by mouth daily.   Yes [provider]  senna-docusate (SENOKOT-S) 8.6-50 MG tablet Take 1 tablet at bedtime as needed by mouth for mild constipation or moderate constipation.    Yes [provider]  sitaGLIPtin (JANUVIA) 50 MG tablet Take 1 tablet (50 mg total) by mouth daily. 04/02/17  Yes Osvaldo Shipper, MD  traMADol (ULTRAM) 50 MG tablet Take 50 mg 3 (three) times daily as needed by mouth for moderate pain.  06/12/17  Yes [provider]  warfarin (COUMADIN) 5 MG tablet Take 5-10 mg as directed by mouth. Take 2 tablets (10 mg) Daily for 7 Days Then Alternate Taking 1 tablet (5 mg) Daily for 7 Days.   Yes [provider]  ferrous sulfate 325 (65 FE) MG tablet Take 325 mg by mouth daily with breakfast.    [provider]  HYDROcodone-acetaminophen (NORCO/VICODIN) 5-325 MG tablet Take 1 tablet by mouth every 6 (six) hours as needed for moderate pain.  03/25/17   [provider]  LANTUS SOLOSTAR 100 UNIT/ML Solostar Pen  07/02/17   [provider]  warfarin (COUMADIN) 5 MG tablet Take by mouth.    [provider]    Family History Family History  Problem Relation Age of Onset  . Breast cancer Neg Hx     Social History Social History   Tobacco Use  . Smoking status: Former Smoker    Types: Cigarettes    Last attempt to quit: 12/30/1969    Years since quitting: 47.5  . Smokeless tobacco: Never Used  Substance Use Topics  . Alcohol use: No    Alcohol/week: 0.0 oz  . Drug use: No     Allergies   Blood-group specific substance   Review of Systems Review of Systems  Constitutional: Negative for fever.  Respiratory: Negative for shortness of breath.   Cardiovascular: Negative for chest pain.  Gastrointestinal: Negative for abdominal pain.  Musculoskeletal: Positive for arthralgias. Negative for back pain, gait problem, joint swelling and myalgias.  Neurological: Negative for headaches.  All other systems reviewed and are negative.    Physical Exam Updated Vital Signs BP (!) 185/77   Pulse (!) 50   Temp 97.7 F (36.5 C) (Oral)   Resp 18   Ht 5\' 10"  (1.778 m)   Wt 106.6 kg (235 lb)   SpO2 98%   BMI 33.72 kg/m   Physical Exam  Constitutional: She is oriented to person, place, and time. She appears well-developed and well-nourished. No distress.  HENT:  Head: Normocephalic and atraumatic. Head is without raccoon's eyes and without Battle's sign.    Right Ear: No hemotympanum.  Left Ear: No hemotympanum.  Nose: Nose normal.  Mouth/Throat: No oropharyngeal exudate.  Eyes: Conjunctivae and EOM are normal. Pupils are equal, round, and reactive to light.  Neck: Normal range of motion. Neck supple.  Cardiovascular: Normal rate, regular rhythm, normal heart sounds and intact distal pulses.  Pulmonary/Chest: Effort normal and breath sounds normal. No stridor. She has no wheezes. She has no rales.  Abdominal: Soft. Bowel sounds are normal. She exhibits no mass. There is no tenderness. There is no rebound and no guarding.  Musculoskeletal: Normal range of motion. She exhibits no edema, tenderness or deformity.       Right hip: Normal.       Right knee: Normal.       Right ankle:  Normal. Achilles tendon normal.       Right upper leg: Normal.  No foreshortening no external rotation,    Neurological: She is alert and oriented to person, place, and time. She displays normal reflexes.  Skin: Skin is warm and dry. Capillary refill takes less than 2 seconds.  Psychiatric: She has a normal mood and affect.     ED Treatments / Results   Vitals:   07/08/17 1830 07/08/17 2301  BP: (!) 177/71 (!) 185/77  Pulse: (!) 55 (!) 50  Resp: 16 18  Temp: 97.7 F (36.5 C)   SpO2: 92% 98%  ; Radiology Dg Knee Complete 4 Views Right  Result Date: 07/08/2017 CLINICAL DATA:  Status post fall with severe right leg pain. EXAM: RIGHT KNEE - COMPLETE 4+ VIEW COMPARISON:  None. FINDINGS: No evidence of fracture, dislocation, or joint effusion. Osteoarthritic changes of the right knee with narrowed joint space and osteophyte formation are noted. Soft tissues are unremarkable. IMPRESSION: No acute fracture or dislocation noted. Electronically Signed   By: Sherian ReinWei-Chen  Lin M.D.   On: 07/08/2017 20:43   Dg Hip Unilat  With Pelvis 2-3 Views Right  Result Date: 07/08/2017 CLINICAL DATA:  Status post fall with severe right leg pain. EXAM: DG HIP (WITH OR WITHOUT  PELVIS) 2-3V RIGHT COMPARISON:  None. FINDINGS: There is no evidence of hip fracture or dislocation. Chronic change of the left hip and acetabulum is noted. IMPRESSION: No acute fracture dislocation identified. Electronically Signed   By: Sherian ReinWei-Chen  Lin M.D.   On: 07/08/2017 20:42   Dg Femur Min 2 Views Right  Result Date: 07/08/2017 CLINICAL DATA:  Status post fall with severe right leg pain. EXAM: RIGHT FEMUR 2 VIEWS COMPARISON:  None. FINDINGS: There is no evidence of fracture or dislocation. Soft tissues are unremarkable. IMPRESSION: No acute fracture or dislocation of the right femur. Electronically Signed   By: Sherian ReinWei-Chen  Lin M.D.   On: 07/08/2017 20:44    Procedures Procedures (including critical care time)  Medications Ordered in ED Medications  lidocaine (LIDODERM) 5 % 2 patch (2 patches Transdermal Patch Applied 07/08/17 2346)  acetaminophen (TYLENOL) tablet 1,000 mg (1,000 mg Oral Given 07/08/17 2346)     Ambulated about the department with assistance.   Final Clinical Impressions(s) / ED Diagnoses   Final diagnoses:  Right buttock pain  Fall, initial encounter    All questions answered to the patient's satisfaction.   Strict return precautions for swelling or the lips or tongue, chest pain, dyspnea on exertion, new weakness or numbness changes in vision or speech, fevers, weakness persistent pain, Inability to tolerate liquids or food, changes in voice cough, altered mental status or any concerns. No signs of systemic illness or infection. The patient is nontoxic-appearing on exam and vital signs are within normal limits.    I have reviewed the triage vital signs and the nursing notes. Pertinent labs &imaging results that were available during my care of the patient were reviewed by me and considered in my medical decision making (see chart for details).  After history, exam, and medical workup I feel the patient has been appropriately medically screened and is safe for  discharge home. Pertinent diagnoses were discussed with the patient. Patient was given return precautions.   Maverick Dieudonne, MD 07/08/17 2351

## 2017-07-08 NOTE — ED Triage Notes (Signed)
Patient reports falling today in bathtub and c/o worsening pain in right hip down to right knee.

## 2017-07-09 MED ORDER — LIDOCAINE 5 % EX PTCH: 1.0000 | MEDICATED_PATCH | CUTANEOUS | 0 refills | Status: DC

## 2017-07-09 MED ORDER — LIDOCAINE 5 % EX PTCH
2.0000 | MEDICATED_PATCH | CUTANEOUS | Status: DC
  Filled 2017-07-09: qty 2

## 2018-04-25 IMAGING — CR DG KNEE COMPLETE 4+V*R*
4 series · 4 of 4 positions shown · non-contrast
Comparison: None.

CLINICAL DATA: Status post fall with severe right leg pain.

EXAM:
RIGHT KNEE - COMPLETE 4+ VIEW

[t knee lat right]
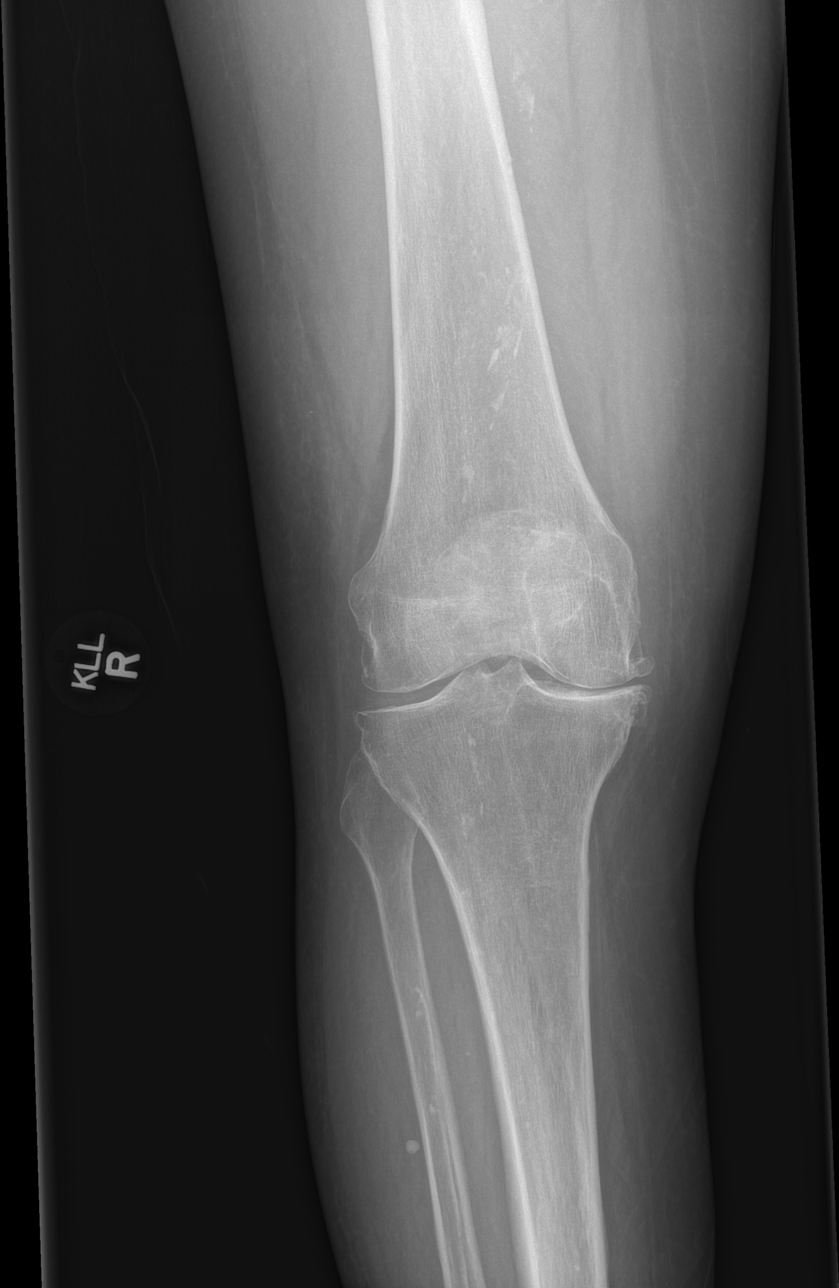

[t knee obl right (1 of 2)]
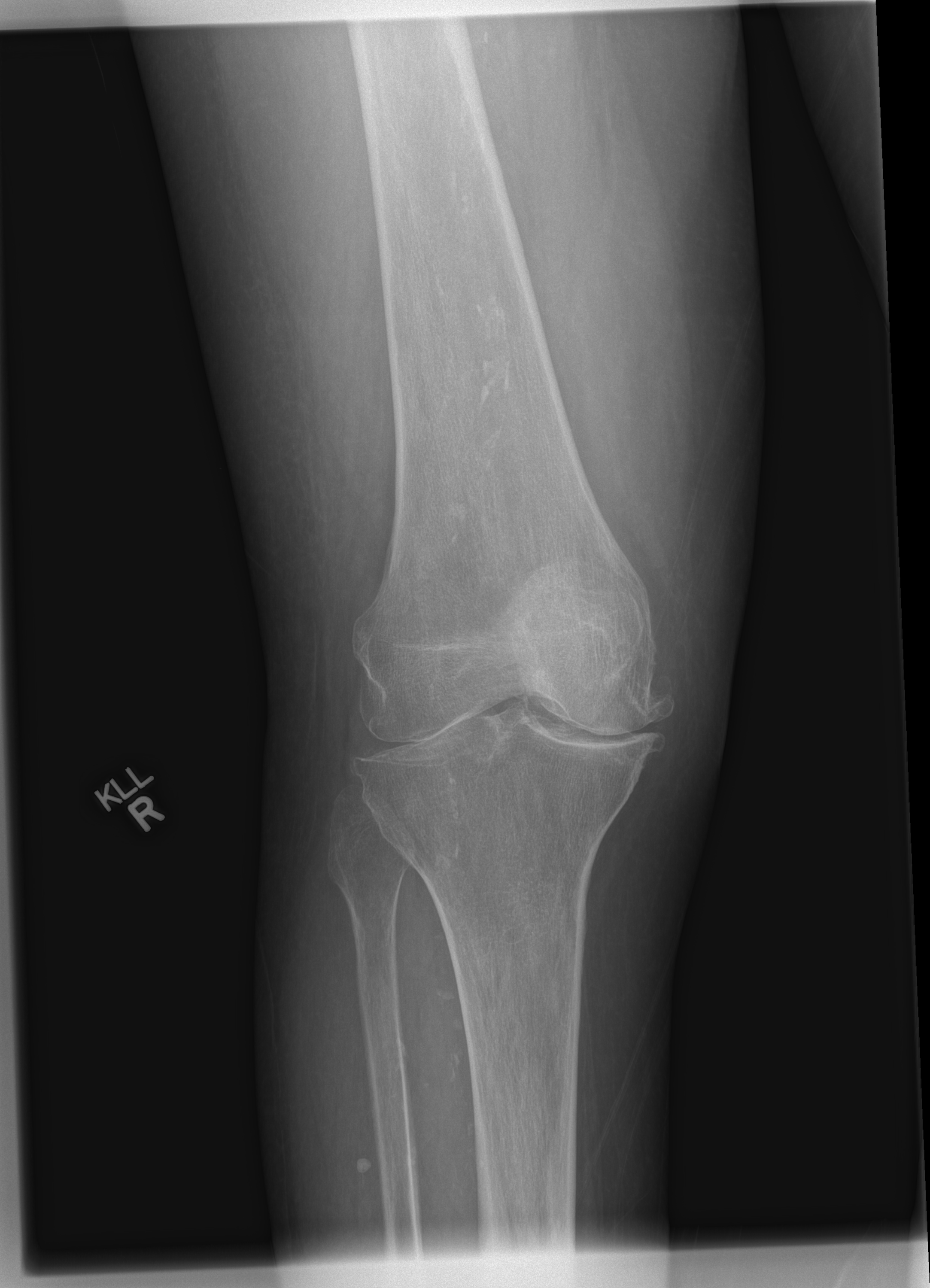

[t knee obl right (2 of 2)]
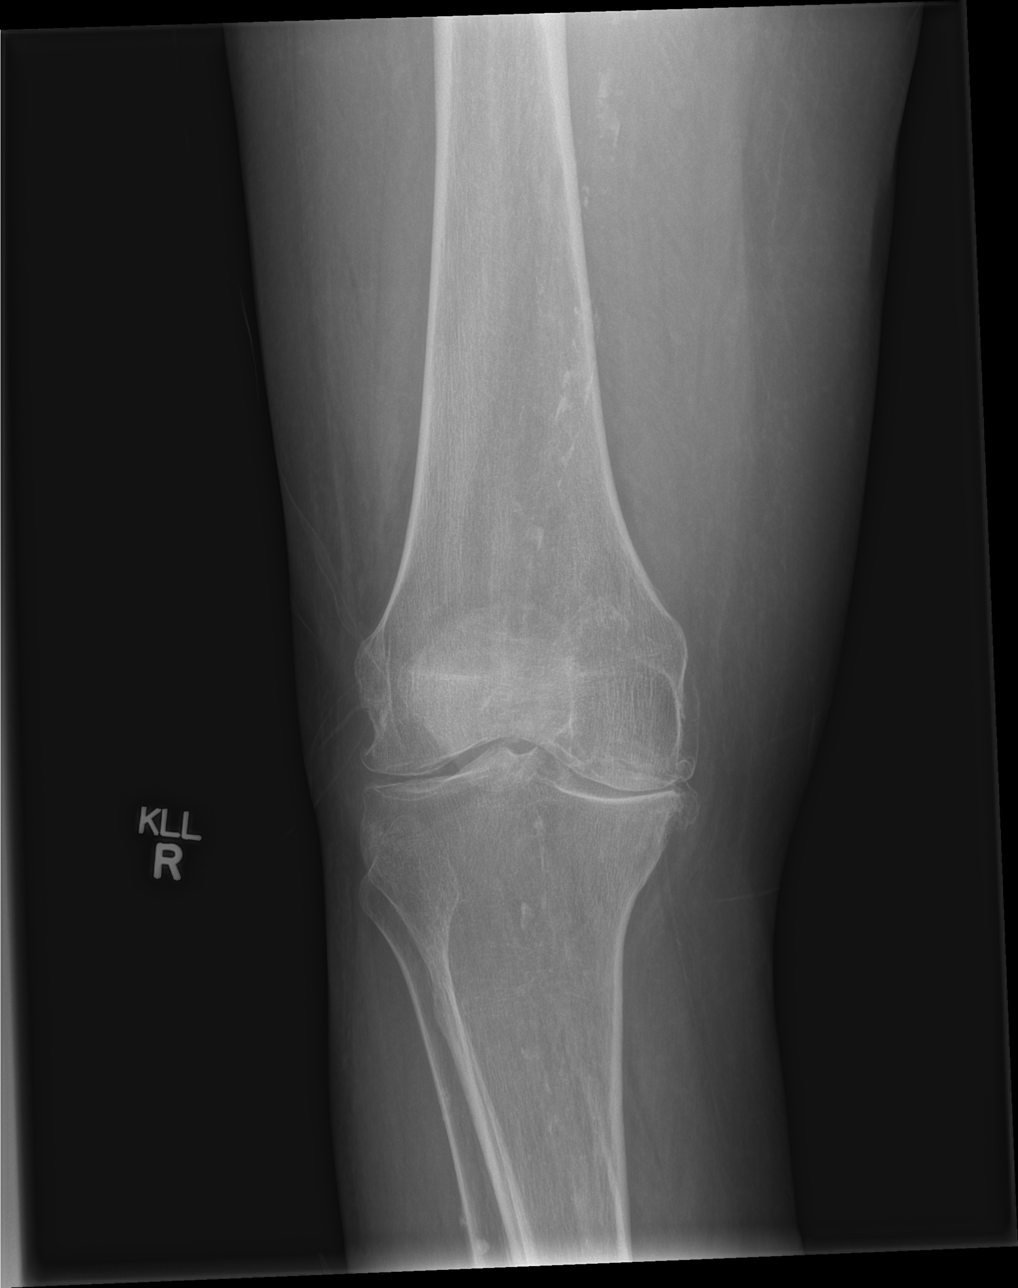

[x knee lat right]
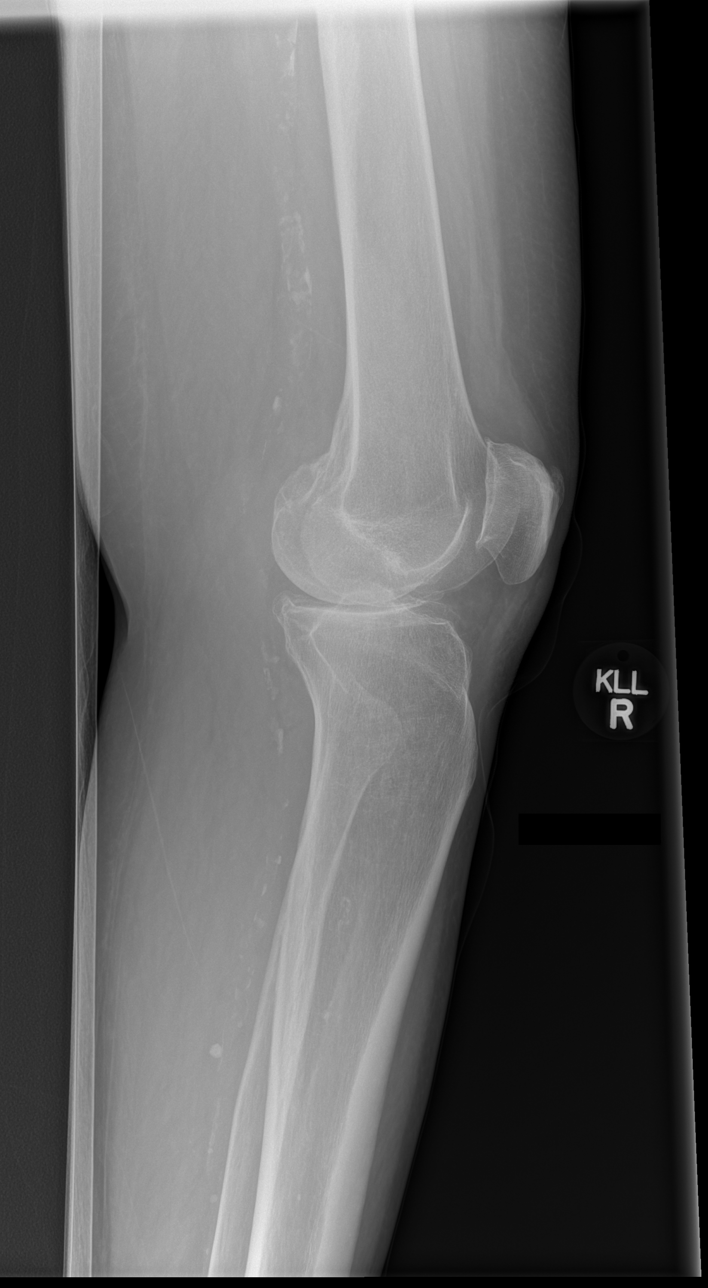

[4 of 4 positions shown; findings below may reference images not displayed]

FINDINGS: No evidence of fracture, dislocation, or joint effusion.
Osteoarthritic changes of the right knee with narrowed joint space
and osteophyte formation are noted. Soft tissues are unremarkable.
IMPRESSION: No acute fracture or dislocation noted.

## 2018-04-25 IMAGING — CR DG HIP (WITH OR WITHOUT PELVIS) 2-3V*R*
3 series · 3 of 3 positions shown · non-contrast
Comparison: None.

CLINICAL DATA: Status post fall with severe right leg pain.

EXAM:
DG HIP (WITH OR WITHOUT PELVIS) 2-3V RIGHT

[t pelvis ap]
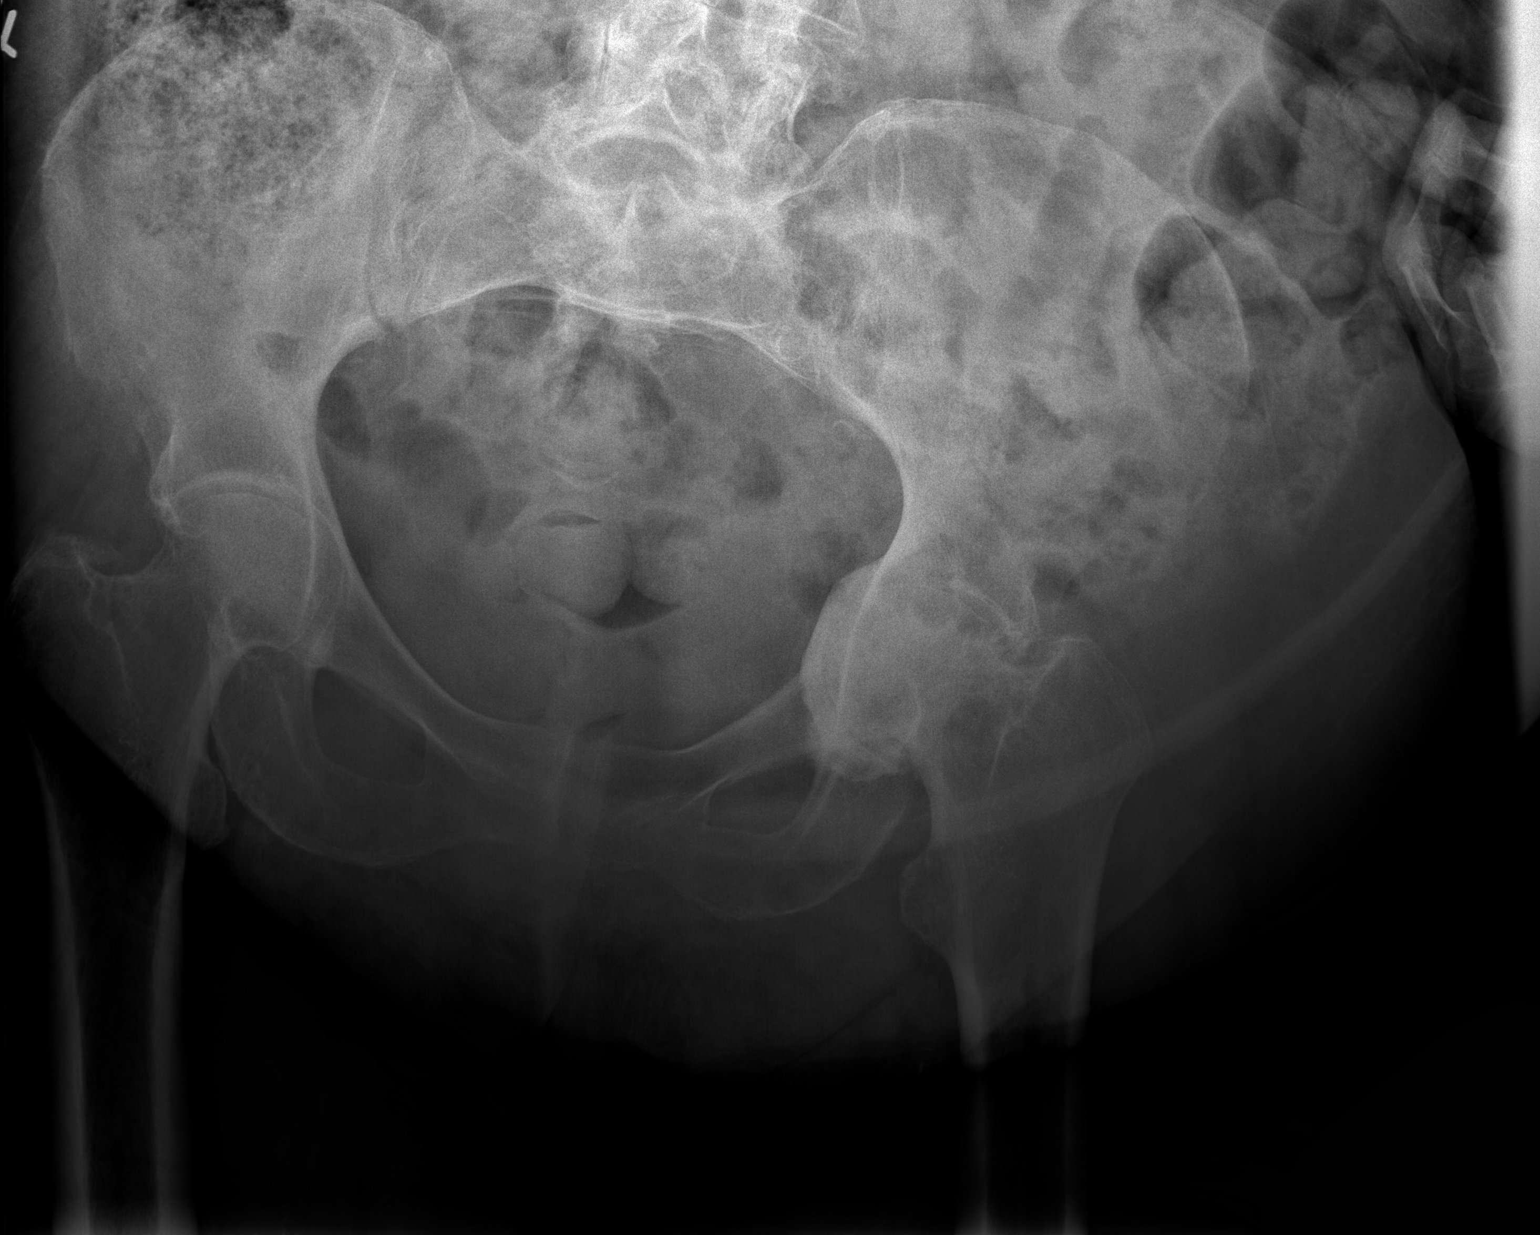

[t hip ap right]
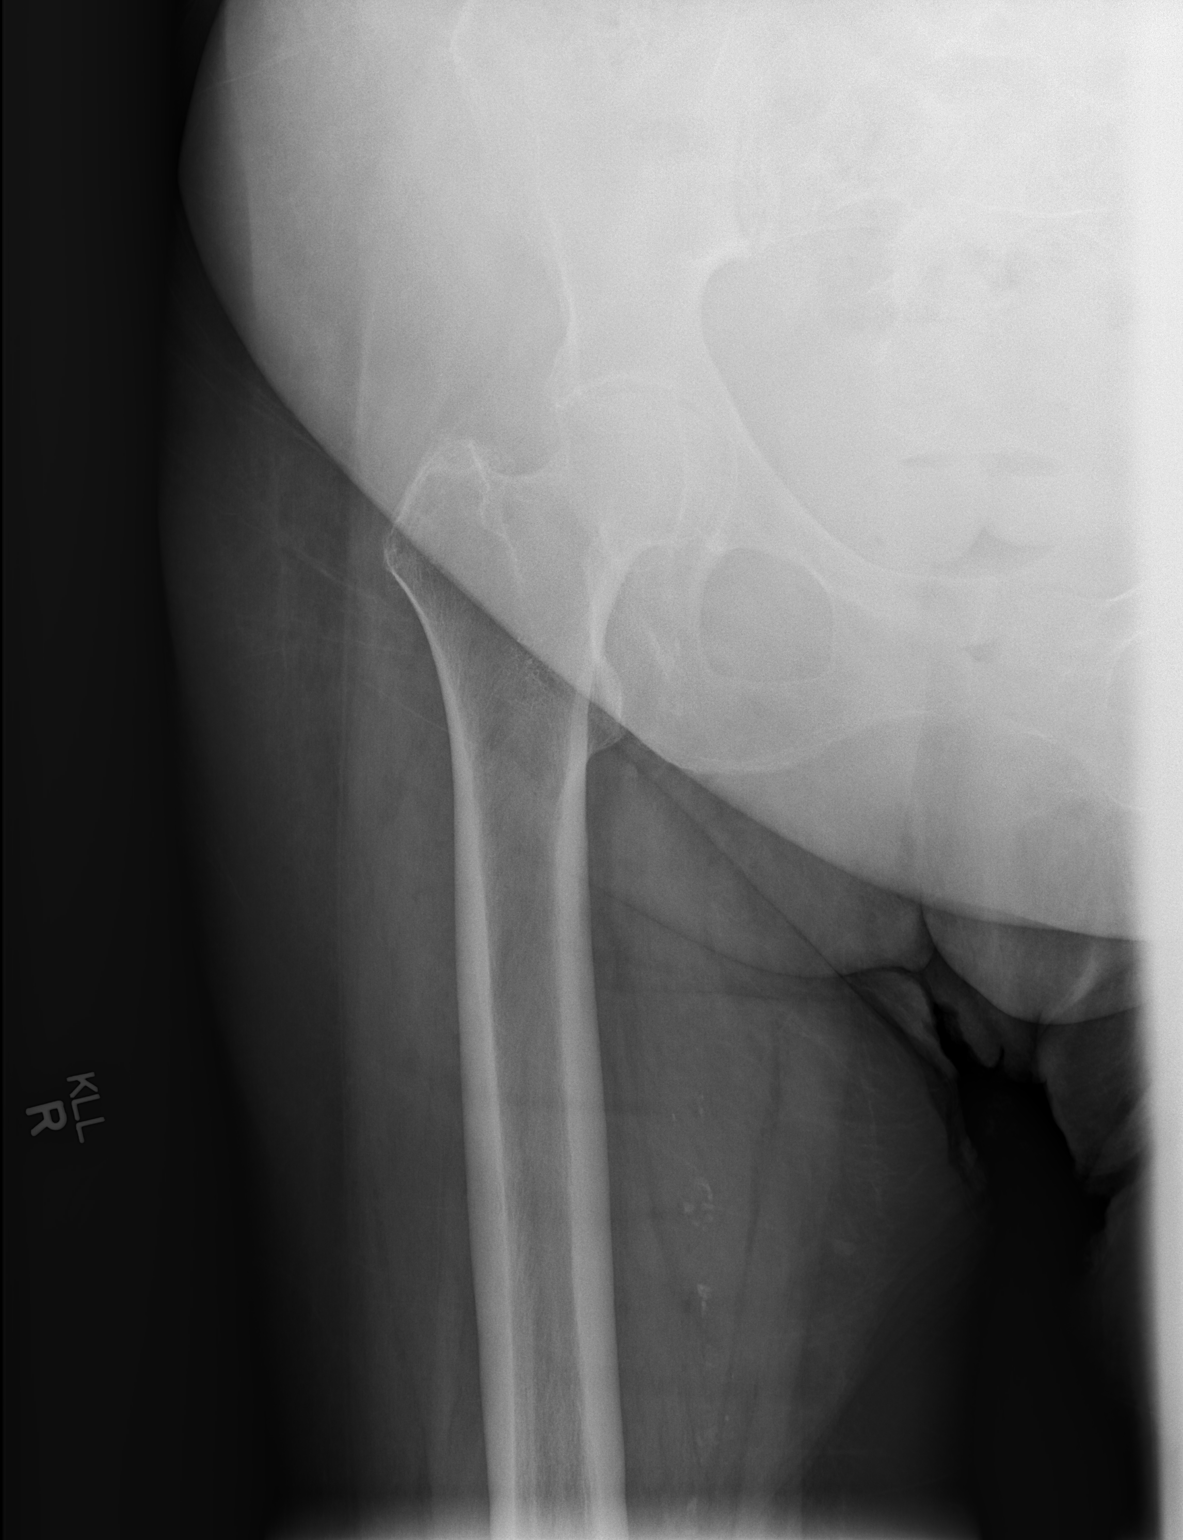

[t hip frog leg right]
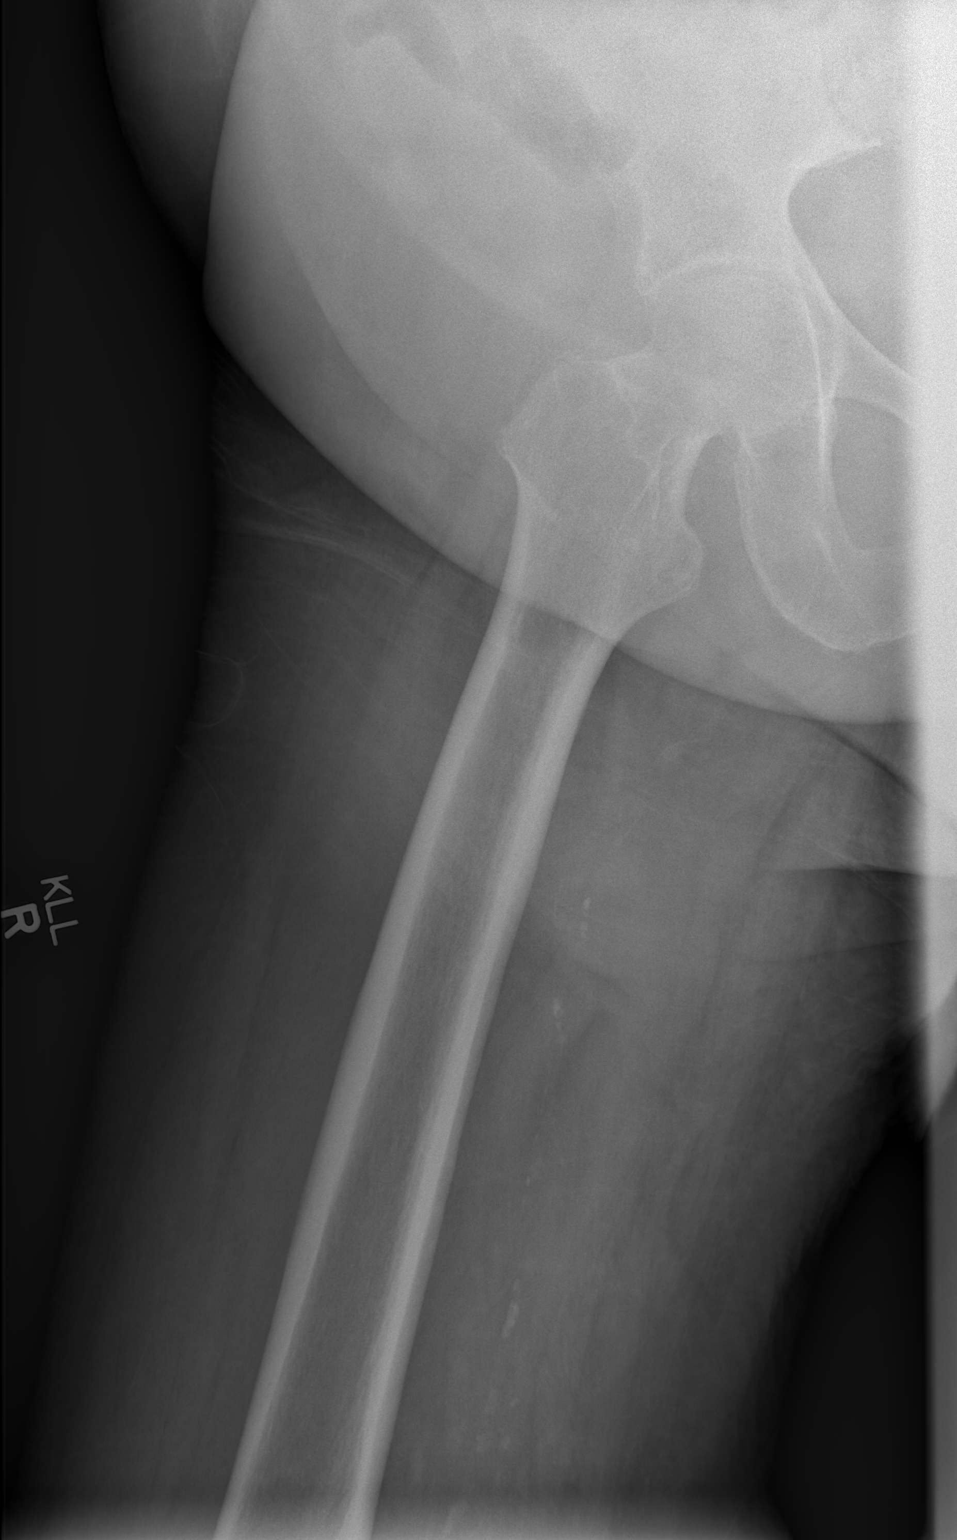

[3 of 3 positions shown; findings below may reference images not displayed]

FINDINGS: There is no evidence of hip fracture or dislocation. Chronic change
of the left hip and acetabulum is noted.
IMPRESSION: No acute fracture dislocation identified.

## 2018-04-25 IMAGING — CR DG FEMUR 2+V*R*
4 series · 4 of 4 positions shown · non-contrast
Comparison: None.

CLINICAL DATA: Status post fall with severe right leg pain.

EXAM:
RIGHT FEMUR 2 VIEWS

[t femur proximal lat right]
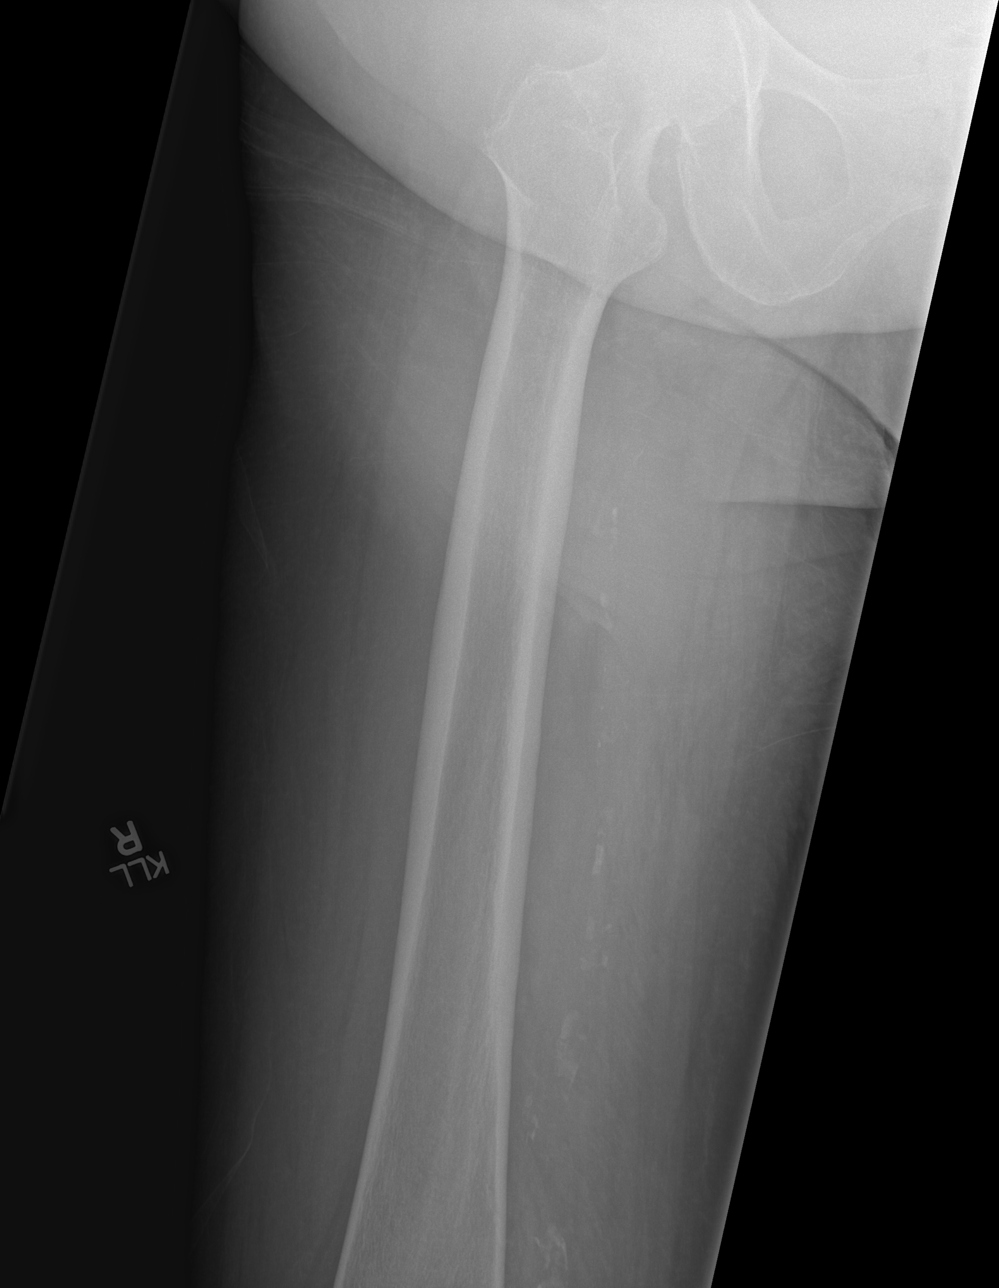

[t femur distal lat right]
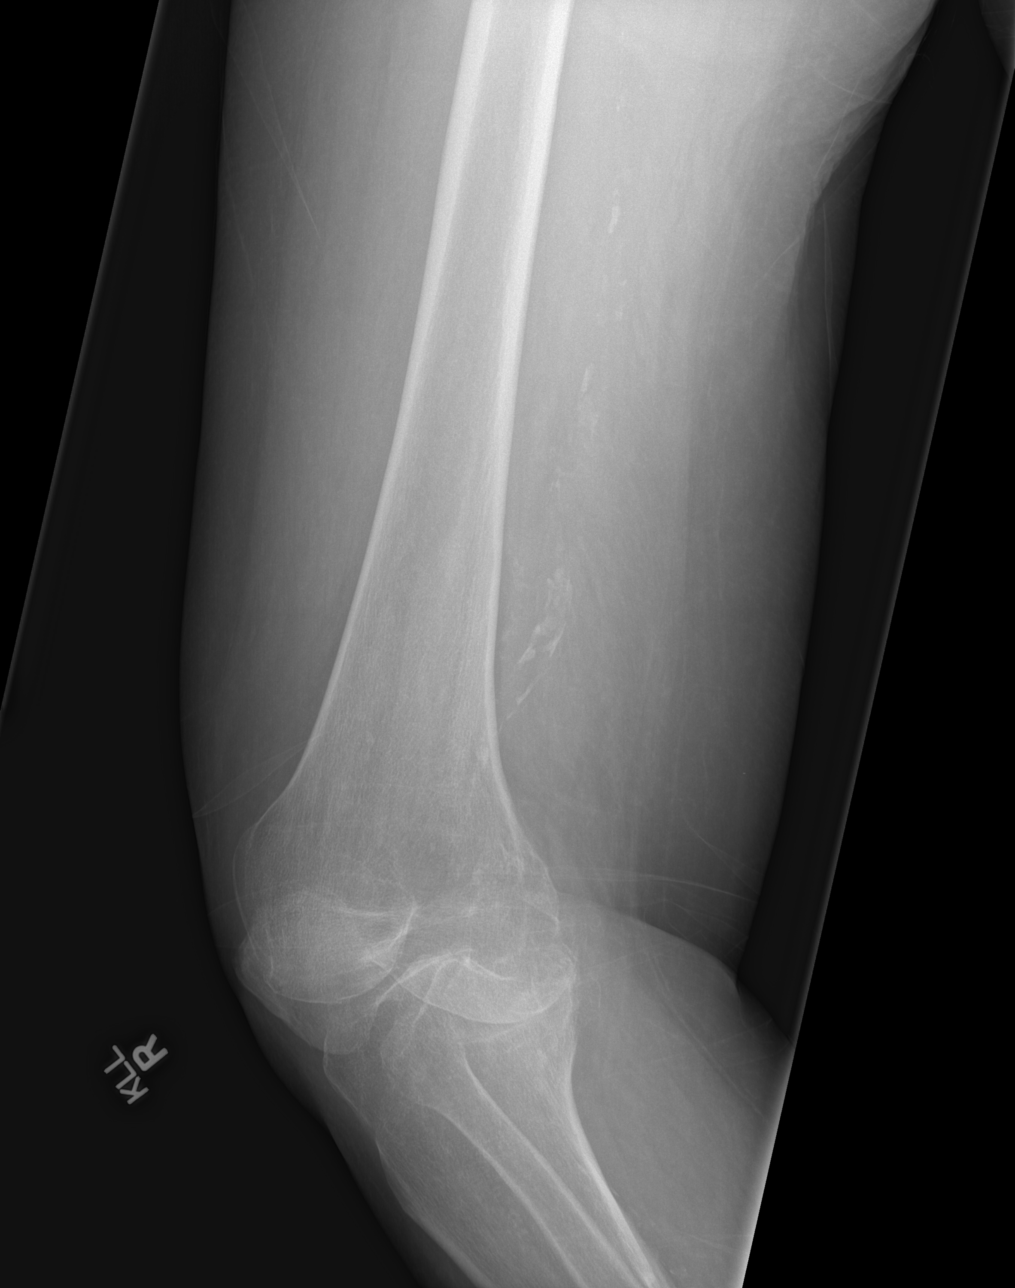

[t femur distal ap right]
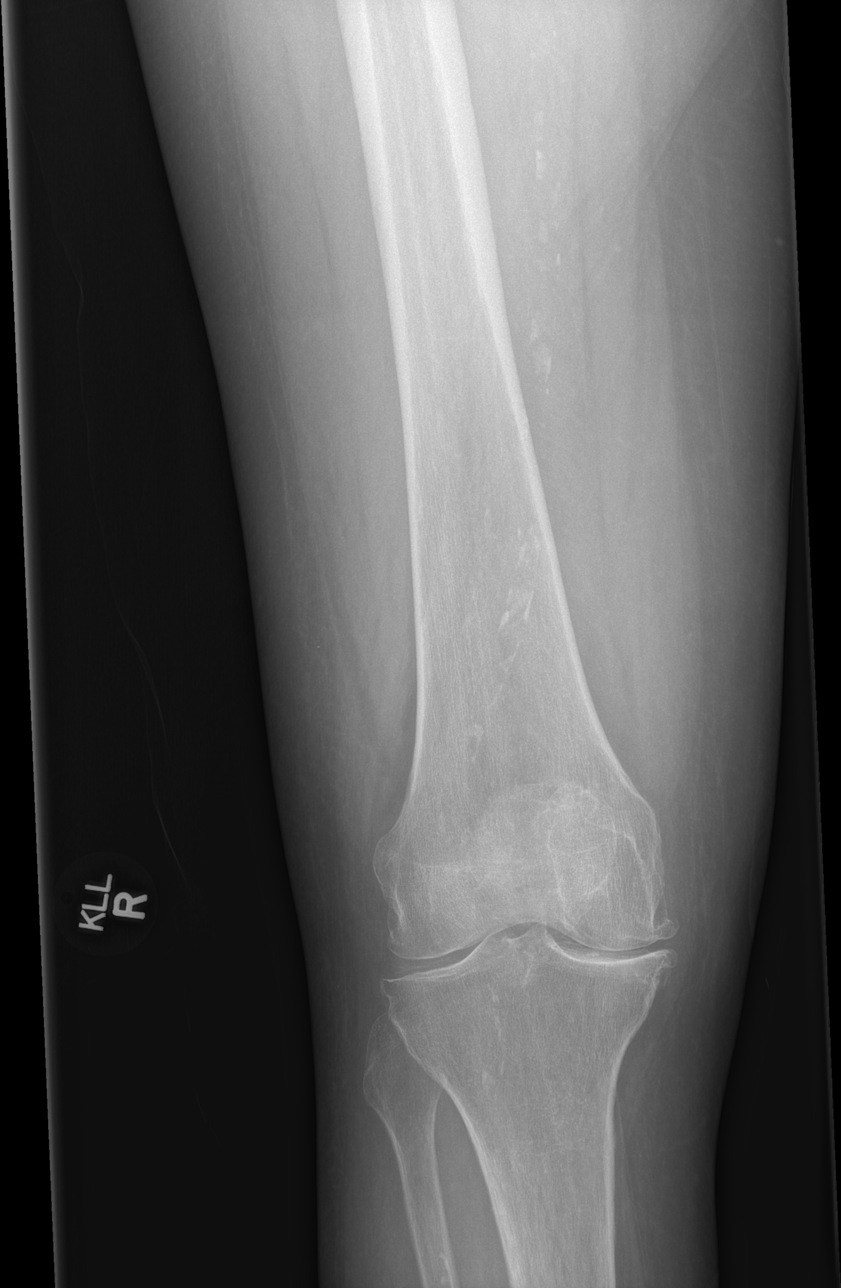

[t femur proximal ap right]
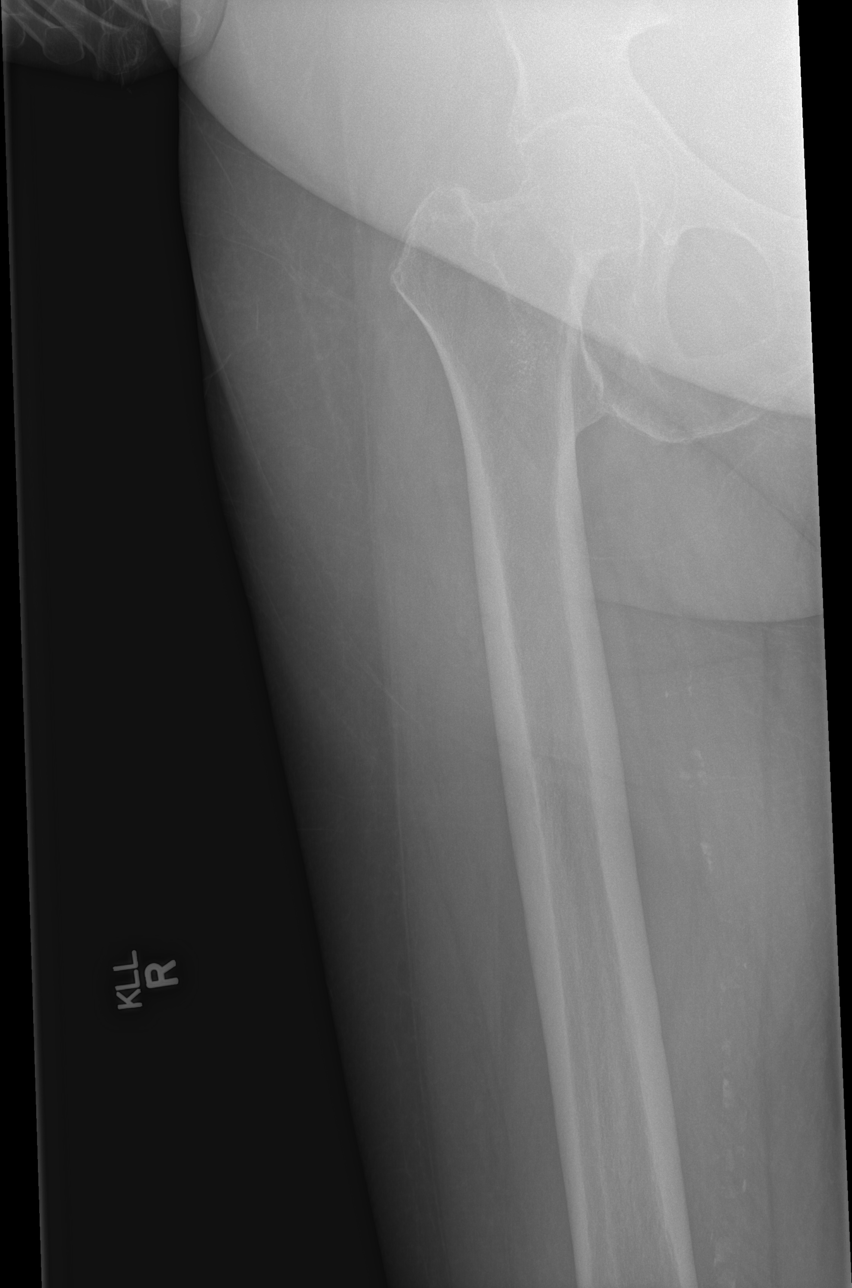

[4 of 4 positions shown; findings below may reference images not displayed]

FINDINGS: There is no evidence of fracture or dislocation. Soft tissues are
unremarkable.
IMPRESSION: No acute fracture or dislocation of the right femur.

## 2018-10-07 ENCOUNTER — Other Ambulatory Visit: Payer: Self-pay | Admitting: Cardiology

## 2018-10-07 DIAGNOSIS — I10 Essential (primary) hypertension: Secondary | ICD-10-CM

## 2018-10-07 DIAGNOSIS — I1A0 Resistant hypertension: Secondary | ICD-10-CM

## 2018-10-14 ENCOUNTER — Other Ambulatory Visit: Payer: Self-pay | Admitting: Cardiology

## 2018-10-14 DIAGNOSIS — R9431 Abnormal electrocardiogram [ECG] [EKG]: Secondary | ICD-10-CM

## 2018-10-14 DIAGNOSIS — R0989 Other specified symptoms and signs involving the circulatory and respiratory systems: Secondary | ICD-10-CM

## 2018-10-20 ENCOUNTER — Ambulatory Visit: Payer: Medicare Other

## 2018-10-20 DIAGNOSIS — I10 Essential (primary) hypertension: Secondary | ICD-10-CM

## 2018-10-20 DIAGNOSIS — R9431 Abnormal electrocardiogram [ECG] [EKG]: Secondary | ICD-10-CM | POA: Diagnosis not present

## 2018-10-20 DIAGNOSIS — R0989 Other specified symptoms and signs involving the circulatory and respiratory systems: Secondary | ICD-10-CM

## 2018-10-28 ENCOUNTER — Ambulatory Visit: Payer: Medicare Other

## 2018-10-28 DIAGNOSIS — I10 Essential (primary) hypertension: Secondary | ICD-10-CM

## 2018-10-30 ENCOUNTER — Other Ambulatory Visit: Payer: Self-pay | Admitting: Cardiology

## 2018-11-05 ENCOUNTER — Ambulatory Visit: Payer: Self-pay | Admitting: Cardiology

## 2018-11-12 ENCOUNTER — Ambulatory Visit: Payer: Self-pay | Admitting: Cardiology

## 2018-11-27 ENCOUNTER — Other Ambulatory Visit: Payer: Self-pay | Admitting: Cardiology

## 2018-12-10 ENCOUNTER — Telehealth: Payer: Self-pay

## 2018-12-11 ENCOUNTER — Encounter: Payer: Self-pay | Admitting: Cardiology

## 2018-12-14 ENCOUNTER — Other Ambulatory Visit: Payer: Self-pay

## 2018-12-14 ENCOUNTER — Encounter: Payer: Self-pay | Admitting: Cardiology

## 2018-12-14 ENCOUNTER — Ambulatory Visit (INDEPENDENT_AMBULATORY_CARE_PROVIDER_SITE_OTHER): Payer: Medicare Other | Admitting: Cardiology

## 2018-12-14 VITALS — BP 195/76 | HR 54 | Ht 68.0 in | Wt 246.0 lb

## 2018-12-14 DIAGNOSIS — N183 Chronic kidney disease, stage 3 unspecified: Secondary | ICD-10-CM

## 2018-12-14 DIAGNOSIS — R0609 Other forms of dyspnea: Secondary | ICD-10-CM | POA: Diagnosis not present

## 2018-12-14 DIAGNOSIS — I129 Hypertensive chronic kidney disease with stage 1 through stage 4 chronic kidney disease, or unspecified chronic kidney disease: Secondary | ICD-10-CM | POA: Diagnosis not present

## 2018-12-14 DIAGNOSIS — I5032 Chronic diastolic (congestive) heart failure: Secondary | ICD-10-CM | POA: Diagnosis not present

## 2018-12-14 DIAGNOSIS — R9431 Abnormal electrocardiogram [ECG] [EKG]: Secondary | ICD-10-CM

## 2018-12-14 DIAGNOSIS — I10 Essential (primary) hypertension: Secondary | ICD-10-CM

## 2018-12-14 MED ORDER — ISOSORBIDE MONONITRATE ER 30 MG PO TB24: 60.0000 mg | ORAL_TABLET | Freq: Every day | ORAL | 3 refills | Status: AC

## 2018-12-14 NOTE — Progress Notes (Signed)
Subjective:   Vickie Pham, female    DOB: 23-Sep-1935, 83 y.o.   MRN: [id number]  Elwyn Reach, MD:  Chief Complaint  Patient presents with  . Congestive Heart Failure  . Follow-up   This visit type was conducted due to national recommendations for restrictions regarding the COVID-19 Pandemic (e.g. social distancing).  This format is felt to be most appropriate for this patient at this time.  All issues noted in this document were discussed and addressed.  No physical exam was performed (except for noted visual exam findings with Telehealth visits).  The patient has consented to conduct a Telehealth visit and understands insurance will be billed.   I discussed the limitations of evaluation and management by telemedicine and the availability of in person appointments. The patient expressed understanding and agreed to proceed.  Virtual Visit via Video Note is as below  I connected with Ms. Tye, on 12/14/18 at 1100 by telephone and verified that I am speaking with the correct person using two identifiers. Patient did not have equipment for virtual visit.    I have discussed with her regarding the safety during COVID Pandemic and steps and precautions including social distancing with the patient.    HPI: Vickie Pham  is a 83 y.o. female  with CKD stage 3, uncontrolled type 2 diabetes, nocturnal hypoxemia, osteoarthritis, recurrent bilateral DVT on low dose anticoagulation, recently evaluated by [place] for resistant hypertension.   Patient has had longstanding history of hypertension that is been uncontrolled for 30 to 40 years.  In view of her bradycardia, clonidine was discontinued and she was started on labetalol 200 mg twice daily.  She was also started on amlodipine 5 mg daily.  She reports tolerating medications well, but admits to not monitoring her blood pressure.  She is also followed by Dr. Elmarie Shiley for chronic kidney disease stage III.  Reports she was recently called by  his office and instructed to take potassium supplement every other day.  She underwent echocardiogram on 10/19/2018 revealing moderate LVH, grade 2 diastolic dysfunction, normal LVEF, and mild to moderate pulmonary hypertension.  For bilateral carotid bruits, underwent carotid duplex also at that time revealing very minimal plaque in left internal carotid artery.  As I was suspicious for renal artery stenosis, she was scheduled for renal artery duplex that was performed on 10/28/2018; however, she had significant amount of gas in her abdomen and we were unable to visualize her renal arteries and study was inconclusive.  Did suggest cyst in her left kidney.  MRI was recommended.  This is a virtual visit for follow-up on hypertension and to discuss her test results.  She is tolerating medications well without any side effects.  Does have intermittent leg edema that is worse some days more than others.  Also has history of lymphedema.  She is unsure if recent leg swelling worsening is related to amlodipine.  Admits to consuming large amounts of water, coffee, and tea throughout the day.  Also admits to not being compliant with her diet.  Dyspnea on exertion has remained stable.  Diabetes is managed by her PCP and she reports is uncontrolled.    She underwent left hip replacement in September. Reports having to wait 1 year to have surgery due to anemia. Patient was on nocturnal O2 for 3 years; however, after her recent hip surgery has discontinued this. PCP has recommended that she go back on it.   Patient moved from [place] in  2001.   Past Medical History:  Diagnosis Date  . Arthritis   . Carotid stenosis, bilateral   . Diabetes mellitus without complication ([place])   . DOE (dyspnea on exertion)   . DVT (deep venous thrombosis) ([place])   . Hyperlipidemia   . Hypertension   . Lymphedema of lower extremity     Past Surgical History:  Procedure Laterality Date  . ABDOMINAL HYSTERECTOMY    .  CHOLECYSTECTOMY      Family History  Problem Relation Age of Onset  . Breast cancer Neg Hx     Social History   Socioeconomic History  . Marital status: Single    Spouse name: Not on file  . Number of children: 4  . Years of education: Not on file  . Highest education level: Not on file  Occupational History  . Not on file  Social Needs  . Financial resource strain: Not on file  . Food insecurity:    Worry: Not on file    Inability: Not on file  . Transportation needs:    Medical: Not on file    Non-medical: Not on file  Tobacco Use  . Smoking status: Former Smoker    Types: Cigarettes    Last attempt to quit: 12/30/1969    Years since quitting: 48.9  . Smokeless tobacco: Never Used  Substance and Sexual Activity  . Alcohol use: No    Alcohol/week: 0.0 standard drinks  . Drug use: No  . Sexual activity: Not on file  Lifestyle  . Physical activity:    Days per week: Not on file    Minutes per session: Not on file  . Stress: Not on file  Relationships  . Social connections:    Talks on phone: Not on file    Gets together: Not on file    Attends religious service: Not on file    Active member of club or organization: Not on file    Attends meetings of clubs or organizations: Not on file    Relationship status: Not on file  . Intimate partner violence:    Fear of current or ex partner: Not on file    Emotionally abused: Not on file    Physically abused: Not on file    Forced sexual activity: Not on file  Other Topics Concern  . Not on file  Social History Narrative  . Not on file    Current Meds  Medication Sig  . amLODipine (NORVASC) 5 MG tablet TAKE 1 TABLET BY MOUTH ONCE DAILY  . apixaban (ELIQUIS) 2.5 MG TABS tablet Take 2.5 mg by mouth 2 (two) times daily.  Marland Kitchen atorvastatin (LIPITOR) 20 MG tablet Take 20 mg by mouth at bedtime.  . furosemide (LASIX) 40 MG tablet Take 80 mg by mouth daily.   Marland Kitchen gabapentin (NEURONTIN) 300 MG capsule Take 300 mg by mouth 4  (four) times daily.  . hydrALAZINE (APRESOLINE) 100 MG tablet Take 100 mg by mouth 4 (four) times daily.  . isosorbide mononitrate (IMDUR) 30 MG 24 hr tablet Take 30 mg at bedtime by mouth.   . labetalol (NORMODYNE) 200 MG tablet TAKE 1 TABLET BY MOUTH TWICE DAILY. STOP CLONIDINE  . LANTUS SOLOSTAR 100 UNIT/ML Solostar Pen   . lisinopril (PRINIVIL,ZESTRIL) 40 MG tablet Take 40 mg by mouth daily.  . metFORMIN (GLUCOPHAGE) 500 MG tablet Take 1 tablet (500 mg total) by mouth 2 (two) times daily with a meal.  . potassium chloride SA (K-DUR,KLOR-CON) 20 MEQ  tablet Take 20 mEq by mouth daily.  . sitaGLIPtin (JANUVIA) 50 MG tablet Take 1 tablet (50 mg total) by mouth daily.  . traMADol (ULTRAM) 50 MG tablet Take 50 mg 3 (three) times daily as needed by mouth for moderate pain.      Review of Systems  Constitution: Negative for decreased appetite, malaise/fatigue, weight gain and weight loss.  Eyes: Positive for visual disturbance (left eye for the last few years).  Cardiovascular: Positive for dyspnea on exertion and leg swelling (intermittent). Negative for chest pain, claudication, orthopnea, palpitations and syncope.  Respiratory: Negative for hemoptysis and wheezing.   Endocrine: Negative for cold intolerance and heat intolerance.  Hematologic/Lymphatic: Does not bruise/bleed easily.  Skin: Negative for nail changes.  Musculoskeletal: Positive for joint pain. Negative for muscle weakness and myalgias.  Gastrointestinal: Negative for abdominal pain, change in bowel habit, nausea and vomiting.  Neurological: Negative for difficulty with concentration, dizziness, focal weakness and headaches.  Psychiatric/Behavioral: Negative for altered mental status and suicidal ideas.  All other systems reviewed and are negative.      Objective:     Blood pressure (!) 195/76, pulse (!) 54, height 5' 8"  (1.727 m), weight 246 lb (111.6 kg).  Cardiac studies:  Renal artery duplex 10/28/2018: No images  of kidneys or renal arteries obtainable. Patient abdomen distended an filled with gas. Aorta is barely visible. The left kidney area contained a large 20 cm x 16 cm simple cyst noted of unknown significance. Recommend MRA with renal protocol if clinically indicted.  Carotid artery duplex 10/19/2018: Minimal plaque in the left internal carotid artery (minimal). Antegrade right vertebral artery flow. Antegrade left vertebral artery flow. Essentially normal study.  Echocardiogram 10/19/2018 : Left ventricle cavity is normal in size. Moderate concentric hypertrophy of the left ventricle. Normal global wall motion. Doppler evidence of grade II (pseudonormal) diastolic dysfunction. Diastolic dysfunction findings suggests elevated LA/LV end diastolic pressure. Calculated EF 60%. Left atrial cavity is severely dilated at 5.4 cm. Mild aortic valve leaflet calcification. No aortic valve regurgitation noted. Normal aortic valve leaflet mobility. Mild mitral valve leaflet calcification. Mild (Grade I) mitral regurgitation. Mild tricuspid regurgitation. Mild to moderate pulmonary hypertension. Estimated pulmonary artery systolic pressure 41 mm Hg (CVP 3 )  Recent Labs: 10/23/2017: RBC 3.6, Hgb 10.5, Hct 31.4, CBC otherwise normal. Glucose 152, Creatinine 1.35, eGFR 37/43, Potassium 4.3, renal function panel. Vit D 42.4  *Physical exam not performed as this is a telephone visit*       Assessment & Recommendations:  1. Resistant hypertension Renal artery duplex results were discussed with patient, inconclusive in view of large amount of gas in her abdomen and poor visualization. Given her significant resistant hypertension, my suspicion for renal artery stenosis is high. She will need MRA of abdomen with renal protocol once able to be performed in view of restrictions. She is tolerating labetalol and amlodipine well. She reports that her most recent labs were performed with Dr. Posey Pronto (neph). I will  request records from him. Unable to increase or change her lisinopril in view of CKD. She also reports that her potassium has been high and she was instructed to take potassium every other day; therefore, cannot add aldactone. She also has bradycardia that is asypmtomatic. I will increase her Imdur up to 99m daily for continued markedly elevated blood pressure. Discussed diet changes with avoiding salt in her diet.   2. Chronic diastolic CHF (congestive heart failure) ([place] I have discussed recent echocardiogram results. Normal LVEF. She does  have grade 2 diastolic dysfunction and moderate LVH. Dyspnea on exertion has been stable. She does mention occasional worsening leg edema since being on amlodipine. She also has lymphedema. I have not increased her amlodipine in view of this; however, unsure if related to amlodipine. On further discussion I suspect related to diet and lifestyle. She admits to consuming large amounts of water, coffee, and tea. I have encouraged her to restrict her fluids to 1.5 liter a day. Also contributed by her obesity.   3. CKD (chronic kidney disease), stage III ([place]) Followed by Dr. [facility] at [facility]. I do not have recent labs, will records from his office for our records. Continue with lasix and Lisinopril as ordered by him.   4. Dyspnea on exertion Multifactoral. She will need adequate BP control and risk factor modification particularly weight loss. She has not had stress testing, underwent hip replacement several months ago without CV complications. Will continue to monitor.   5. Abnormal EKG Known EKG abnormalities. EKG not performed today as this is a virtual visit. Will continue to monitor.  EKG 09/24/2018:Marked sinus bradycardia at 48 bpm with 1 PVC, left axis deviation, left anterior fasicular block, possible old anteroseptal infarct. Incomplete left bundle branch block. Abnormal EKG.  6. Severe obesity (BMI 35-39.9) with cormobidity Weight loss is  significantly important in controlling her risk factors. Diet modifications were discussed.   Plan: I will see her back in 2 weeks for close follow up on her hypertension.    Jeri Lager, MSN, APRN, FNP-C [facility], [place] Office: [phone] Fax: [phone]

## 2018-12-28 ENCOUNTER — Encounter: Payer: Self-pay | Admitting: Cardiology

## 2018-12-28 ENCOUNTER — Ambulatory Visit (INDEPENDENT_AMBULATORY_CARE_PROVIDER_SITE_OTHER): Payer: Medicare Other | Admitting: Cardiology

## 2018-12-28 ENCOUNTER — Other Ambulatory Visit: Payer: Self-pay | Admitting: Cardiology

## 2018-12-28 ENCOUNTER — Ambulatory Visit: Payer: Medicare Other | Admitting: Cardiology

## 2018-12-28 ENCOUNTER — Other Ambulatory Visit: Payer: Self-pay

## 2018-12-28 VITALS — BP 147/70 | HR 52 | Ht 68.0 in | Wt 253.0 lb

## 2018-12-28 DIAGNOSIS — N183 Chronic kidney disease, stage 3 unspecified: Secondary | ICD-10-CM

## 2018-12-28 DIAGNOSIS — I129 Hypertensive chronic kidney disease with stage 1 through stage 4 chronic kidney disease, or unspecified chronic kidney disease: Secondary | ICD-10-CM

## 2018-12-28 DIAGNOSIS — I5032 Chronic diastolic (congestive) heart failure: Secondary | ICD-10-CM | POA: Diagnosis not present

## 2018-12-28 DIAGNOSIS — I10 Essential (primary) hypertension: Secondary | ICD-10-CM

## 2018-12-28 NOTE — Progress Notes (Deleted)
Subjective:   Kennedee Kitzmiller, female    DOB: [birth year], 83 y.o.   MRN: [id number]  Elwyn Reach, MD:  No chief complaint on file.  This visit type was conducted due to national recommendations for restrictions regarding the COVID-19 Pandemic (e.g. social distancing).  This format is felt to be most appropriate for this patient at this time.  All issues noted in this document were discussed and addressed.  No physical exam was performed (except for noted visual exam findings with Telehealth visits).  The patient has consented to conduct a Telehealth visit and understands insurance will be billed.   I discussed the limitations of evaluation and management by telemedicine and the availability of in person appointments. The patient expressed understanding and agreed to proceed.  Virtual Visit via Video Note is as below  I connected with Ms. Peddy, on 12/28/18 at 1100 by telephone and verified that I am speaking with the correct person using two identifiers. Patient did not have equipment for virtual visit.    I have discussed with her regarding the safety during COVID Pandemic and steps and precautions including social distancing with the patient.    HPI: Chade Pitner  is a 83 y.o. female  with CKD stage 3, uncontrolled type 2 diabetes, nocturnal hypoxemia, osteoarthritis, recurrent bilateral DVT on low dose anticoagulation, recently evaluated by [place] for resistant hypertension.   Patient has had longstanding history of hypertension that is been uncontrolled for 30 to 40 years.  In view of her bradycardia, clonidine was discontinued and she was started on labetalol 200 mg twice daily.  She was also started on amlodipine 5 mg daily.  She reports tolerating medications well, but admits to not monitoring her blood pressure.  She is also followed by Dr. Elmarie Shiley for chronic kidney disease stage III.  Reports she was recently called by his office and instructed to take potassium supplement  every other day.  She underwent echocardiogram on 10/19/2018 revealing moderate LVH, grade 2 diastolic dysfunction, normal LVEF, and mild to moderate pulmonary hypertension.  For bilateral carotid bruits, underwent carotid duplex also at that time revealing very minimal plaque in left internal carotid artery.  As I was suspicious for renal artery stenosis, she was scheduled for renal artery duplex that was performed on 10/28/2018; however, she had significant amount of gas in her abdomen and we were unable to visualize her renal arteries and study was inconclusive.  Did suggest cyst in her left kidney.  MRI was recommended.  This is a virtual visit for follow-up on hypertension and to discuss her test results.  She is tolerating medications well without any side effects.  Does have intermittent leg edema that is worse some days more than others.  Also has history of lymphedema.  She is unsure if recent leg swelling worsening is related to amlodipine.  Admits to consuming large amounts of water, coffee, and tea throughout the day.  Also admits to not being compliant with her diet.  Dyspnea on exertion has remained stable.  Diabetes is managed by her PCP and she reports is uncontrolled.    She underwent left hip replacement in September. Reports having to wait 1 year to have surgery due to anemia. Patient was on nocturnal O2 for 3 years; however, after her recent hip surgery has discontinued this. PCP has recommended that she go back on it.   Patient moved from [place] in 2001.   Past Medical History:  Diagnosis Date  .  Arthritis   . Carotid stenosis, bilateral   . Diabetes mellitus without complication ([place])   . DOE (dyspnea on exertion)   . DVT (deep venous thrombosis) ([place])   . Hyperlipidemia   . Hypertension   . Lymphedema of lower extremity     Past Surgical History:  Procedure Laterality Date  . ABDOMINAL HYSTERECTOMY    . CHOLECYSTECTOMY      Family History  Problem Relation Age of  Onset  . Breast cancer Neg Hx     Social History   Socioeconomic History  . Marital status: Single    Spouse name: Not on file  . Number of children: 4  . Years of education: Not on file  . Highest education level: Not on file  Occupational History  . Not on file  Social Needs  . Financial resource strain: Not on file  . Food insecurity:    Worry: Not on file    Inability: Not on file  . Transportation needs:    Medical: Not on file    Non-medical: Not on file  Tobacco Use  . Smoking status: Former Smoker    Types: Cigarettes    Last attempt to quit: 12/30/1969    Years since quitting: 49.0  . Smokeless tobacco: Never Used  Substance and Sexual Activity  . Alcohol use: No    Alcohol/week: 0.0 standard drinks  . Drug use: No  . Sexual activity: Not on file  Lifestyle  . Physical activity:    Days per week: Not on file    Minutes per session: Not on file  . Stress: Not on file  Relationships  . Social connections:    Talks on phone: Not on file    Gets together: Not on file    Attends religious service: Not on file    Active member of club or organization: Not on file    Attends meetings of clubs or organizations: Not on file    Relationship status: Not on file  . Intimate partner violence:    Fear of current or ex partner: Not on file    Emotionally abused: Not on file    Physically abused: Not on file    Forced sexual activity: Not on file  Other Topics Concern  . Not on file  Social History Narrative  . Not on file    No outpatient medications have been marked as taking for the 12/28/18 encounter (Appointment) with Miquel Dunn, NP.     Review of Systems  Constitution: Negative for decreased appetite, malaise/fatigue, weight gain and weight loss.  Eyes: Positive for visual disturbance (left eye for the last few years).  Cardiovascular: Positive for dyspnea on exertion and leg swelling (intermittent). Negative for chest pain, claudication,  orthopnea, palpitations and syncope.  Respiratory: Negative for hemoptysis and wheezing.   Endocrine: Negative for cold intolerance and heat intolerance.  Hematologic/Lymphatic: Does not bruise/bleed easily.  Skin: Negative for nail changes.  Musculoskeletal: Positive for joint pain. Negative for muscle weakness and myalgias.  Gastrointestinal: Negative for abdominal pain, change in bowel habit, nausea and vomiting.  Neurological: Negative for difficulty with concentration, dizziness, focal weakness and headaches.  Psychiatric/Behavioral: Negative for altered mental status and suicidal ideas.  All other systems reviewed and are negative.      Objective:     There were no vitals taken for this visit.  Cardiac studies:  Renal artery duplex 10/28/2018: No images of kidneys or renal arteries obtainable. Patient abdomen distended an filled  with gas. Aorta is barely visible. The left kidney area contained a large 20 cm x 16 cm simple cyst noted of unknown significance. Recommend MRA with renal protocol if clinically indicted.  Carotid artery duplex 10/19/2018: Minimal plaque in the left internal carotid artery (minimal). Antegrade right vertebral artery flow. Antegrade left vertebral artery flow. Essentially normal study.  Echocardiogram 10/19/2018 : Left ventricle cavity is normal in size. Moderate concentric hypertrophy of the left ventricle. Normal global wall motion. Doppler evidence of grade II (pseudonormal) diastolic dysfunction. Diastolic dysfunction findings suggests elevated LA/LV end diastolic pressure. Calculated EF 60%. Left atrial cavity is severely dilated at 5.4 cm. Mild aortic valve leaflet calcification. No aortic valve regurgitation noted. Normal aortic valve leaflet mobility. Mild mitral valve leaflet calcification. Mild (Grade I) mitral regurgitation. Mild tricuspid regurgitation. Mild to moderate pulmonary hypertension. Estimated pulmonary artery systolic pressure  41 mm Hg (CVP 3 )  Recent Labs: 10/23/2017: RBC 3.6, Hgb 10.5, Hct 31.4, CBC otherwise normal. Glucose 152, Creatinine 1.35, eGFR 37/43, Potassium 4.3, renal function panel. Vit D 42.4  *Physical exam not performed as this is a telephone visit*       Assessment & Recommendations:  1. Resistant hypertension Renal artery duplex results were discussed with patient, inconclusive in view of large amount of gas in her abdomen and poor visualization. Given her significant resistant hypertension, my suspicion for renal artery stenosis is high. She will need MRA of abdomen with renal protocol once able to be performed in view of restrictions. She is tolerating labetalol and amlodipine well. She reports that her most recent labs were performed with Dr. Posey Pronto (neph). I will request records from him. Unable to increase or change her lisinopril in view of CKD. She also reports that her potassium has been high and she was instructed to take potassium every other day; therefore, cannot add aldactone. She also has bradycardia that is asypmtomatic. I will increase her Imdur up to '60mg'$  daily for continued markedly elevated blood pressure. Discussed diet changes with avoiding salt in her diet.   2. Chronic diastolic CHF (congestive heart failure) ([place]) I have discussed recent echocardiogram results. Normal LVEF. She does have grade 2 diastolic dysfunction and moderate LVH. Dyspnea on exertion has been stable. She does mention occasional worsening leg edema since being on amlodipine. She also has lymphedema. I have not increased her amlodipine in view of this; however, unsure if related to amlodipine. On further discussion I suspect related to diet and lifestyle. She admits to consuming large amounts of water, coffee, and tea. I have encouraged her to restrict her fluids to 1.5 liter a day. Also contributed by her obesity.   3. CKD (chronic kidney disease), stage III ([place]) Followed by Dr. Elmar[facility] at [facility]. I do not have recent labs, will records from his office for our records. Continue with lasix and Lisinopril as ordered by him.   4. Dyspnea on exertion Multifactoral. She will need adequate BP control and risk factor modification particularly weight loss. She has not had stress testing, underwent hip replacement several months ago without CV complications. Will continue to monitor.   5. Abnormal EKG Known EKG abnormalities. EKG not performed today as this is a virtual visit. Will continue to monitor.  EKG 09/24/2018:Marked sinus bradycardia at 48 bpm with 1 PVC, left axis deviation, left anterior fasicular block, possible old anteroseptal infarct. Incomplete left bundle branch block. Abnormal EKG.  6. Severe obesity (BMI 35-39.9) with cormobidity Weight loss is significantly important in  controlling her risk factors. Diet modifications were discussed.   Plan: I will see her back in 2 weeks for close follow up on her hypertension.    Jeri Lager, MSN, APRN, FNP-C [facility], [place] Office: [phone] Fax: [phone]

## 2018-12-28 NOTE — Progress Notes (Signed)
Subjective:   Vickie Pham, female    DOB: May 01, 1936, 83 y.o.   MRN: [id number]  Vickie Reach, MD:  Chief Complaint  Patient presents with  . Hypertension    2 week f/u    This visit type was conducted due to national recommendations for restrictions regarding the COVID-19 Pandemic (e.g. social distancing).  This format is felt to be most appropriate for this patient at this time.  All issues noted in this document were discussed and addressed.  No physical exam was performed (except for noted visual exam findings with Telehealth visits).  The patient has consented to conduct a Telehealth visit and understands insurance will be billed.   I discussed the limitations of evaluation and management by telemedicine and the availability of in person appointments. The patient expressed understanding and agreed to proceed.  Virtual Visit via Video Note is as below  I connected with Vickie Pham, on 12/28/18 at 1430 by telephone and verified that I am speaking with the correct person using two identifiers. Patient did not have equipment for virtual visit.    I have discussed with her regarding the safety during COVID Pandemic and steps and precautions including social distancing with the patient.    HPI: Vickie Pham  is a 83 y.o. female  with CKD stage 3, uncontrolled type 2 diabetes, nocturnal hypoxemia, osteoarthritis, recurrent bilateral DVT on low dose anticoagulation, and resistant hypertension   Patient has had longstanding history of hypertension that is been uncontrolled for 30 to 40 years. She was last seen virtually 2 weeks ago, Imdur was increased in view of continued markedly elevated blood pressure. As I was suspicious for renal artery stenosis, she was scheduled for renal artery duplex that was performed on 10/28/2018; however, she had significant amount of gas in her abdomen and we were unable to visualize her renal arteries and study was inconclusive.  Did suggest cyst in her  left kidney.  MRI was recommended.   She is also followed by Dr. Elmarie Shiley for chronic kidney disease stage III. Reports she was recently called by his office and instructed to take potassium supplement every other day.  She underwent echocardiogram on 10/19/2018 revealing moderate LVH, grade 2 diastolic dysfunction, normal LVEF, and mild to moderate pulmonary hypertension.  For bilateral carotid bruits, underwent carotid duplex also at that time revealing very minimal plaque in left internal carotid artery.      She is tolerating medications well without any side effects.  Does have intermittent leg edema that is worse some days more than others.  Also has history of lymphedema. For the last few days, she has had improvement in leg edema. Admits to making some diet changes; however, it still appears that she is drinking several diet sodas, cups of coffee, and tea throughout the day.  Dyspnea on exertion has remained stable.  Diabetes is managed by her PCP and she reports is uncontrolled.    She underwent left hip replacement in September 2019. Reports having to wait 1 year to have surgery due to anemia. Patient was on nocturnal O2 for 3 years; however, after her recent hip surgery has discontinued this. PCP has recommended that she go back on it.   Patient moved from [place] in 2001.   Past Medical History:  Diagnosis Date  . Arthritis   . Carotid stenosis, bilateral   . Diabetes mellitus without complication ([place])   . DOE (dyspnea on exertion)   . DVT (deep  venous thrombosis) ([place])   . Hyperlipidemia   . Hypertension   . Lymphedema of lower extremity     Past Surgical History:  Procedure Laterality Date  . ABDOMINAL HYSTERECTOMY    . CHOLECYSTECTOMY      Family History  Problem Relation Age of Onset  . Breast cancer Neg Hx     Social History   Socioeconomic History  . Marital status: Single    Spouse name: Not on file  . Number of children: 4  . Years of education: Not on  file  . Highest education level: Not on file  Occupational History  . Not on file  Social Needs  . Financial resource strain: Not on file  . Food insecurity:    Worry: Not on file    Inability: Not on file  . Transportation needs:    Medical: Not on file    Non-medical: Not on file  Tobacco Use  . Smoking status: Former Smoker    Types: Cigarettes    Last attempt to quit: 12/30/1969    Years since quitting: 49.0  . Smokeless tobacco: Never Used  Substance and Sexual Activity  . Alcohol use: No    Alcohol/week: 0.0 standard drinks  . Drug use: No  . Sexual activity: Not on file  Lifestyle  . Physical activity:    Days per week: Not on file    Minutes per session: Not on file  . Stress: Not on file  Relationships  . Social connections:    Talks on phone: Not on file    Gets together: Not on file    Attends religious Pham: Not on file    Active member of club or organization: Not on file    Attends meetings of clubs or organizations: Not on file    Relationship status: Not on file  . Intimate partner violence:    Fear of current or ex partner: Not on file    Emotionally abused: Not on file    Physically abused: Not on file    Forced sexual activity: Not on file  Other Topics Concern  . Not on file  Social History Narrative  . Not on file    Current Meds  Medication Sig  . amLODipine (NORVASC) 5 MG tablet TAKE 1 TABLET BY MOUTH ONCE DAILY  . apixaban (ELIQUIS) 2.5 MG TABS tablet Take 2.5 mg by mouth 2 (two) times daily.  Marland Kitchen atorvastatin (LIPITOR) 20 MG tablet Take 20 mg by mouth at bedtime.  . furosemide (LASIX) 40 MG tablet Take 80 mg by mouth 2 (two) times daily.   Marland Kitchen gabapentin (NEURONTIN) 300 MG capsule Take 300 mg by mouth 4 (four) times daily.  . hydrALAZINE (APRESOLINE) 100 MG tablet Take 100 mg by mouth 4 (four) times daily.  . isosorbide mononitrate (IMDUR) 30 MG 24 hr tablet Take 2 tablets (60 mg total) by mouth daily.  Marland Kitchen labetalol (NORMODYNE) 200 MG  tablet TAKE 1 TABLET BY MOUTH TWICE DAILY. STOP CLONIDINE  . LANTUS SOLOSTAR 100 UNIT/ML Solostar Pen Inject 10 Units into the skin daily.   Marland Kitchen lisinopril (PRINIVIL,ZESTRIL) 40 MG tablet Take 40 mg by mouth daily.  . metFORMIN (GLUCOPHAGE) 500 MG tablet Take 1 tablet (500 mg total) by mouth 2 (two) times daily with a meal.  . potassium chloride SA (K-DUR,KLOR-CON) 20 MEQ tablet Take 20 mEq by mouth daily.  . sitaGLIPtin (JANUVIA) 50 MG tablet Take 1 tablet (50 mg total) by mouth daily.  . traMADol Veatrice Bourbon)  50 MG tablet Take 50 mg 3 (three) times daily as needed by mouth for moderate pain.      Review of Systems  Constitution: Negative for decreased appetite, malaise/fatigue, weight gain and weight loss.  Eyes: Positive for visual disturbance (left eye for the last few years).  Cardiovascular: Positive for dyspnea on exertion and leg swelling (intermittent). Negative for chest pain, claudication, orthopnea, palpitations and syncope.  Respiratory: Negative for hemoptysis and wheezing.   Endocrine: Negative for cold intolerance and heat intolerance.  Hematologic/Lymphatic: Does not bruise/bleed easily.  Skin: Negative for nail changes.  Musculoskeletal: Positive for joint pain. Negative for muscle weakness and myalgias.  Gastrointestinal: Negative for abdominal pain, change in bowel habit, nausea and vomiting.  Neurological: Negative for difficulty with concentration, dizziness, focal weakness and headaches.  Psychiatric/Behavioral: Negative for altered mental status and suicidal ideas.  All other systems reviewed and are negative.      Objective:     Blood pressure (!) 147/70, pulse (!) 52, height '5\' 8"'$  (1.727 m), weight 253 lb (114.8 kg).  Cardiac studies:  Renal artery duplex 10/28/2018: No images of kidneys or renal arteries obtainable. Patient abdomen distended an filled with gas. Aorta is barely visible. The left kidney area contained a large 20 cm x 16 cm simple cyst noted of  unknown significance. Recommend MRA with renal protocol if clinically indicted.  Carotid artery duplex 10/19/2018: Minimal plaque in the left internal carotid artery (minimal). Antegrade right vertebral artery flow. Antegrade left vertebral artery flow. Essentially normal study.  Echocardiogram 10/19/2018 : Left ventricle cavity is normal in size. Moderate concentric hypertrophy of the left ventricle. Normal global wall motion. Doppler evidence of grade II (pseudonormal) diastolic dysfunction. Diastolic dysfunction findings suggests elevated LA/LV end diastolic pressure. Calculated EF 60%. Left atrial cavity is severely dilated at 5.4 cm. Mild aortic valve leaflet calcification. No aortic valve regurgitation noted. Normal aortic valve leaflet mobility. Mild mitral valve leaflet calcification. Mild (Grade I) mitral regurgitation. Mild tricuspid regurgitation. Mild to moderate pulmonary hypertension. Estimated pulmonary artery systolic pressure 41 mm Hg (CVP 3 )  Recent Labs: 10/23/2017: RBC 3.6, Hgb 10.5, Hct 31.4, CBC otherwise normal. Glucose 152, Creatinine 1.35, eGFR 37/43, Potassium 4.3, renal function panel. Vit D 42.4  *Physical exam not performed as this is a telephone visit*       Assessment & Recommendations:  1. Resistant hypertension Blood pressure today has significantly improved; however, I have asked her to check more frequently for better assessment. She continues to need MRA of abdomen with renal protocol once able to be performed in view of restrictions, will follow up on getting this ordered. Tolerating medications well, no changes in medications were made today. Discussed continued efforts toward diet changes with avoiding salt in her diet.   2. Chronic diastolic CHF (congestive heart failure) (HCC) Symptomatically seems to be stable. Suspect secondary to uncontrolled hypertension and obesity, will continue to need aggressive risk factor modification. I have encouraged  her to restrict her fluids to approximately 1.5 L per day especially to cut back on sodas, tea, and coffee.   3. CKD (chronic kidney disease), stage III ([place]) Followed by Dr. Elmarie Shiley at [facility]. I do not have recent labs, will records from his office for our records.    4. Severe obesity (BMI 35-39.9) with cormobidity Continued to stress the importance of weight loss in helping to control several of her comorbidities.   Plan: Will see her back in 6 weeks for follow up on  hypertension and to discuss MRA results.   Jeri Lager, MSN, APRN, FNP-C [facility], [place] Office: [phone] Fax: [phone]

## 2018-12-29 NOTE — Telephone Encounter (Signed)
Please fill

## 2019-01-25 ENCOUNTER — Other Ambulatory Visit: Payer: Self-pay | Admitting: Cardiology

## 2019-01-27 ENCOUNTER — Other Ambulatory Visit: Payer: Self-pay | Admitting: Cardiology

## 2019-02-10 ENCOUNTER — Other Ambulatory Visit: Payer: Self-pay | Admitting: Cardiology

## 2019-02-11 ENCOUNTER — Ambulatory Visit: Payer: Medicare Other | Admitting: Cardiology

## 2019-02-22 ENCOUNTER — Other Ambulatory Visit: Payer: Self-pay | Admitting: Cardiology

## 2019-02-23 NOTE — Telephone Encounter (Signed)
Please fill

## 2019-03-03 ENCOUNTER — Telehealth: Payer: Self-pay

## 2019-03-05 ENCOUNTER — Other Ambulatory Visit: Payer: Self-pay | Admitting: Cardiology

## 2019-03-09 ENCOUNTER — Other Ambulatory Visit: Payer: Self-pay | Admitting: Cardiology

## 2019-03-09 DIAGNOSIS — I10 Essential (primary) hypertension: Secondary | ICD-10-CM

## 2019-03-18 ENCOUNTER — Ambulatory Visit (HOSPITAL_COMMUNITY)
Admission: RE | Admit: 2019-03-18 | Discharge: 2019-03-18 | Disposition: A | Discharge status: Home / Self Care | Payer: Medicare Other | Source: Ambulatory Visit | Attending: Cardiology | Admitting: Cardiology

## 2019-03-18 ENCOUNTER — Other Ambulatory Visit: Payer: Self-pay

## 2019-03-18 DIAGNOSIS — I10 Essential (primary) hypertension: Secondary | ICD-10-CM | POA: Diagnosis present

## 2019-04-06 ENCOUNTER — Encounter: Payer: Self-pay | Admitting: Cardiology

## 2019-04-06 ENCOUNTER — Ambulatory Visit (INDEPENDENT_AMBULATORY_CARE_PROVIDER_SITE_OTHER): Payer: Medicare Other | Admitting: Cardiology

## 2019-04-06 ENCOUNTER — Other Ambulatory Visit: Payer: Self-pay

## 2019-04-06 VITALS — BP 138/63 | HR 50 | Ht 68.0 in | Wt 266.0 lb

## 2019-04-06 DIAGNOSIS — N183 Chronic kidney disease, stage 3 unspecified: Secondary | ICD-10-CM

## 2019-04-06 DIAGNOSIS — I10 Essential (primary) hypertension: Secondary | ICD-10-CM

## 2019-04-06 DIAGNOSIS — I5032 Chronic diastolic (congestive) heart failure: Secondary | ICD-10-CM | POA: Diagnosis not present

## 2019-04-06 DIAGNOSIS — R0609 Other forms of dyspnea: Secondary | ICD-10-CM

## 2019-04-06 NOTE — Progress Notes (Signed)
Subjective:   Vickie Pham, female    DOB: 08/09/1936, 83 y.o.   MRN: [id number]  Elwyn Reach, MD:  Chief Complaint  Patient presents with  . resistant hypertension    HPI: Vickie Pham  is a 83 y.o. female  with CKD stage 3, uncontrolled type 2 diabetes, nocturnal hypoxemia, osteoarthritis, recurrent bilateral DVT on low dose anticoagulation, and resistant hypertension   Patient has had longstanding history of hypertension that is been uncontrolled for 30 to 40 years. She was last seen virtually 2 weeks ago, Imdur was increased in view of continued markedly elevated blood pressure. As I was suspicious for renal artery stenosis, she was scheduled for renal artery duplex that was performed on 10/28/2018; however, she had significant amount of gas in her abdomen and we were unable to visualize her renal arteries and study was inconclusive.  Did suggest cyst in her left kidney.  MRI was performed and is here to discuss results.   She is also followed by Dr. Elmarie Shiley for chronic kidney disease stage III. Reports she was recently called by his office and instructed to take potassium supplement every other day.  She underwent echocardiogram on 10/19/2018 revealing moderate LVH, grade 2 diastolic dysfunction, normal LVEF, and mild to moderate pulmonary hypertension.  For bilateral carotid bruits, underwent carotid duplex also at that time revealing very minimal plaque in left internal carotid artery.      She is tolerating medications well without any side effects.  Does have intermittent leg edema that is worse some days more than others.  Also has history of lymphedema. Diabetes is managed by her PCP and she reports is uncontrolled. She does admit that she is not eating appropriately, and has gained 20 lbs. She has noticed worsening dyspnea on exertion with this. No chest pain.  She underwent left hip replacement in September 2019. Reports having to wait 1 year to have surgery due to anemia.  Patient was on nocturnal O2 for 3 years; however, after her recent hip surgery has discontinued this. PCP has recommended that she go back on it.   Patient moved from [place] in 2001.   Past Medical History:  Diagnosis Date  . Arthritis   . Carotid stenosis, bilateral   . Diabetes mellitus without complication ([place])   . DOE (dyspnea on exertion)   . DVT (deep venous thrombosis) ([place])   . Hyperlipidemia   . Hypertension   . Lymphedema of lower extremity     Past Surgical History:  Procedure Laterality Date  . ABDOMINAL HYSTERECTOMY    . CHOLECYSTECTOMY      Family History  Problem Relation Age of Onset  . Breast cancer Neg Hx     Social History   Socioeconomic History  . Marital status: Single    Spouse name: Not on file  . Number of children: 4  . Years of education: Not on file  . Highest education level: Not on file  Occupational History  . Not on file  Social Needs  . Financial resource strain: Not on file  . Food insecurity    Worry: Not on file    Inability: Not on file  . Transportation needs    Medical: Not on file    Non-medical: Not on file  Tobacco Use  . Smoking status: Former Smoker    Types: Cigarettes    Quit date: 12/30/1969    Years since quitting: 49.2  . Smokeless tobacco: Never Used  Substance and Sexual Activity  . Alcohol use: No    Alcohol/week: 0.0 standard drinks  . Drug use: No  . Sexual activity: Not on file  Lifestyle  . Physical activity    Days per week: Not on file    Minutes per session: Not on file  . Stress: Not on file  Relationships  . Social Herbalist on phone: Not on file    Gets together: Not on file    Attends religious service: Not on file    Active member of club or organization: Not on file    Attends meetings of clubs or organizations: Not on file    Relationship status: Not on file  . Intimate partner violence    Fear of current or ex partner: Not on file    Emotionally abused: Not on file     Physically abused: Not on file    Forced sexual activity: Not on file  Other Topics Concern  . Not on file  Social History Narrative  . Not on file    Current Meds  Medication Sig  . amLODipine (NORVASC) 5 MG tablet TAKE 1 TABLET BY MOUTH ONCE DAILY  . apixaban (ELIQUIS) 2.5 MG TABS tablet Take 2.5 mg by mouth 2 (two) times daily.  Marland Kitchen atorvastatin (LIPITOR) 20 MG tablet Take 20 mg by mouth at bedtime.  . cholecalciferol (VITAMIN D3) 25 MCG (1000 UT) tablet Take 1,000 Units by mouth daily.  . furosemide (LASIX) 40 MG tablet Take 80 mg by mouth 2 (two) times daily.   Marland Kitchen gabapentin (NEURONTIN) 300 MG capsule Take 300 mg by mouth 4 (four) times daily.  . hydrALAZINE (APRESOLINE) 100 MG tablet Take 100 mg by mouth 4 (four) times daily.  Marland Kitchen labetalol (NORMODYNE) 200 MG tablet TAKE 1 TABLET BY MOUTH 2 TIMES A DAY  . LANTUS SOLOSTAR 100 UNIT/ML Solostar Pen Inject 10 Units into the skin daily.   Marland Kitchen lisinopril (PRINIVIL,ZESTRIL) 40 MG tablet Take 40 mg by mouth daily.  . metFORMIN (GLUCOPHAGE) 500 MG tablet Take 1 tablet (500 mg total) by mouth 2 (two) times daily with a meal.  . potassium chloride SA (K-DUR,KLOR-CON) 20 MEQ tablet Take 20 mEq by mouth daily.  . sitaGLIPtin (JANUVIA) 50 MG tablet Take 1 tablet (50 mg total) by mouth daily.  . traMADol (ULTRAM) 50 MG tablet Take 50 mg 3 (three) times daily as needed by mouth for moderate pain.   . vitamin B-12 (CYANOCOBALAMIN) 1000 MCG tablet Take 1,000 mcg by mouth daily.     Review of Systems  Constitution: Positive for weight gain. Negative for decreased appetite, malaise/fatigue and weight loss.  Eyes: Positive for visual disturbance (left eye for the last few years).  Cardiovascular: Positive for dyspnea on exertion and leg swelling (intermittent). Negative for chest pain, claudication, orthopnea, palpitations and syncope.  Respiratory: Negative for hemoptysis and wheezing.   Endocrine: Negative for cold intolerance and heat intolerance.   Hematologic/Lymphatic: Does not bruise/bleed easily.  Skin: Negative for nail changes.  Musculoskeletal: Positive for joint pain. Negative for muscle weakness and myalgias.  Gastrointestinal: Negative for abdominal pain, change in bowel habit, nausea and vomiting.  Neurological: Negative for difficulty with concentration, dizziness, focal weakness and headaches.  Psychiatric/Behavioral: Negative for altered mental status and suicidal ideas.  All other systems reviewed and are negative.      Objective:     Blood pressure 138/63, pulse (!) 50, height 5' 8"  (1.727 m), weight 266 lb (120.7 kg),  SpO2 97 %.  Physical Exam  Constitutional: She is oriented to person, place, and time. Vital signs are normal. She appears well-developed and well-nourished.  Well built, morbidly obese  HENT:  Head: Normocephalic and atraumatic.  Neck: Normal range of motion.  Cardiovascular: Normal rate, regular rhythm, normal heart sounds and intact distal pulses.  Pulses:      Carotid pulses are on the right side with bruit and on the left side with bruit.      Femoral pulses are 1+ on the right side and 1+ on the left side.      Popliteal pulses are 1+ on the right side and 1+ on the left side.       Dorsalis pedis pulses are 1+ on the right side and 1+ on the left side.       Posterior tibial pulses are 1+ on the right side and 1+ on the left side.  Pulses feeble due to bodily habitus. 1 + bilateral leg edema  Pulmonary/Chest: Effort normal and breath sounds normal. No accessory muscle usage. No respiratory distress.  Abdominal: Soft. Bowel sounds are normal.  Musculoskeletal: Normal range of motion.  Neurological: She is alert and oriented to person, place, and time.  Skin: Skin is warm and dry.  Vitals reviewed.    Cardiac studies:  MRA abdomen 03/18/2019: 1. No evidence of hemodynamically significant proximal renal artery stenosis. 2. Bilateral renal cysts, largest 18.8 cm left.  Renal artery  duplex 10/28/2018: No images of kidneys or renal arteries obtainable. Patient abdomen distended an filled with gas. Aorta is barely visible. The left kidney area contained a large 20 cm x 16 cm simple cyst noted of unknown significance. Recommend MRA with renal protocol if clinically indicted.  Carotid artery duplex 10/19/2018: Minimal plaque in the left internal carotid artery (minimal). Antegrade right vertebral artery flow. Antegrade left vertebral artery flow. Essentially normal study.  Echocardiogram 10/19/2018 : Left ventricle cavity is normal in size. Moderate concentric hypertrophy of the left ventricle. Normal global wall motion. Doppler evidence of grade II (pseudonormal) diastolic dysfunction. Diastolic dysfunction findings suggests elevated LA/LV end diastolic pressure. Calculated EF 60%. Left atrial cavity is severely dilated at 5.4 cm. Mild aortic valve leaflet calcification. No aortic valve regurgitation noted. Normal aortic valve leaflet mobility. Mild mitral valve leaflet calcification. Mild (Grade I) mitral regurgitation. Mild tricuspid regurgitation. Mild to moderate pulmonary hypertension. Estimated pulmonary artery systolic pressure 41 mm Hg (CVP 3 )  Recent Labs: 10/23/2017: RBC 3.6, Hgb 10.5, Hct 31.4, CBC otherwise normal. Glucose 152, Creatinine 1.35, eGFR 37/43, Potassium 4.3, renal function panel. Vit D 42.4  EKG 04/06/2019: Sinus bradycardia at 47 bpm, left axis deviation, left anterior fasicular block, possible old anteroseptal infarct. ILBBB. Abnormal EKG.     Assessment & Recommendations:   Patient is presently doing well, she does have dyspnea on exertion that has recently worsened.  She has noticed that she has gained about 20 to 25 pounds since the start of the pandemic.  Feel that this is the etiology of her symptoms.  Her blood pressure has significantly improved.  I discussed MRI of her abdomen, no evidence of renal artery stenosis.  We will continue  with current medications.  She does have chronic kidney disease being followed by Dr. Posey Pronto.  She is unsure of her next appointment with him.  If she has not had recent kidney function evaluation, will perform this at her next office visit.  She does report that her diabetes is uncontrolled.  This is currently being managed by her PCP and she is to follow-up with him regarding this.  I have again stressed the importance of diet modifications to help with both her weight loss, diabetes, and hypertension. [facility] diet sheet was provided. Suspect her worsening dyspnea is related to weight gain. I will see her back in 3 months for follow-up.   Jeri Lager, MSN, APRN, FNP-C [facility], [place] Office: [phone] Fax: [phone]

## 2019-04-11 ENCOUNTER — Encounter: Payer: Self-pay | Admitting: Cardiology

## 2019-04-12 ENCOUNTER — Other Ambulatory Visit: Payer: Self-pay

## 2019-04-12 ENCOUNTER — Ambulatory Visit (INDEPENDENT_AMBULATORY_CARE_PROVIDER_SITE_OTHER): Payer: Medicare Other | Admitting: Podiatry

## 2019-04-12 VITALS — Temp 98.0°F

## 2019-04-12 DIAGNOSIS — E1149 Type 2 diabetes mellitus with other diabetic neurological complication: Secondary | ICD-10-CM

## 2019-04-12 DIAGNOSIS — R0989 Other specified symptoms and signs involving the circulatory and respiratory systems: Secondary | ICD-10-CM | POA: Diagnosis not present

## 2019-04-12 DIAGNOSIS — M79675 Pain in left toe(s): Secondary | ICD-10-CM | POA: Diagnosis not present

## 2019-04-12 DIAGNOSIS — M79674 Pain in right toe(s): Secondary | ICD-10-CM

## 2019-04-12 DIAGNOSIS — B351 Tinea unguium: Secondary | ICD-10-CM

## 2019-04-18 NOTE — Progress Notes (Signed)
Subjective:   Patient ID: Vickie Pham, female   DOB: 83 y.o.   MRN: [id number]   HPI 83 year old female presents the office today for concerns of her big toenails splitting and they are elongated causing irritation.  Denies any redness or drainage or any swelling to the toenail site.  This is most big toenails.  She states that she is in a nail trim as she cannot get in her self.  She is diabetic and she states that her A1c is "terrible, not good".  No open sores.  No other concerns today.   Review of Systems  All other systems reviewed and are negative.  Past Medical History:  Diagnosis Date  . Arthritis   . Carotid stenosis, bilateral   . Diabetes mellitus without complication ([place])   . DOE (dyspnea on exertion)   . DVT (deep venous thrombosis) ([place])   . Hyperlipidemia   . Hypertension   . Lymphedema of lower extremity     Past Surgical History:  Procedure Laterality Date  . ABDOMINAL HYSTERECTOMY    . CHOLECYSTECTOMY       Current Outpatient Medications:  .  amLODipine (NORVASC) 5 MG tablet, TAKE 1 TABLET BY MOUTH ONCE DAILY, Disp: 30 tablet, Rfl: 1 .  apixaban (ELIQUIS) 2.5 MG TABS tablet, Take 2.5 mg by mouth 2 (two) times daily., Disp: , Rfl:  .  atorvastatin (LIPITOR) 20 MG tablet, Take 20 mg by mouth at bedtime., Disp: , Rfl:  .  cholecalciferol (VITAMIN D3) 25 MCG (1000 UT) tablet, Take 1,000 Units by mouth daily., Disp: , Rfl:  .  furosemide (LASIX) 40 MG tablet, Take 80 mg by mouth 2 (two) times daily. , Disp: , Rfl:  .  gabapentin (NEURONTIN) 300 MG capsule, Take 300 mg by mouth 4 (four) times daily., Disp: , Rfl:  .  hydrALAZINE (APRESOLINE) 100 MG tablet, Take 100 mg by mouth 4 (four) times daily., Disp: , Rfl:  .  labetalol (NORMODYNE) 200 MG tablet, TAKE 1 TABLET BY MOUTH 2 TIMES A DAY, Disp: 60 tablet, Rfl: 1 .  LANTUS SOLOSTAR 100 UNIT/ML Solostar Pen, Inject 10 Units into the skin daily. , Disp: , Rfl:  .  lisinopril (PRINIVIL,ZESTRIL) 40 MG tablet, Take 40  mg by mouth daily., Disp: , Rfl:  .  metFORMIN (GLUCOPHAGE) 500 MG tablet, Take 1 tablet (500 mg total) by mouth 2 (two) times daily with a meal., Disp: 60 tablet, Rfl: 0 .  potassium chloride SA (K-DUR,KLOR-CON) 20 MEQ tablet, Take 20 mEq by mouth daily., Disp: , Rfl:  .  sitaGLIPtin (JANUVIA) 50 MG tablet, Take 1 tablet (50 mg total) by mouth daily., Disp: 30 tablet, Rfl: 0 .  traMADol (ULTRAM) 50 MG tablet, Take 50 mg 3 (three) times daily as needed by mouth for moderate pain. , Disp: , Rfl:  .  vitamin B-12 (CYANOCOBALAMIN) 1000 MCG tablet, Take 1,000 mcg by mouth daily., Disp: , Rfl:  .  isosorbide mononitrate (IMDUR) 30 MG 24 hr tablet, Take 2 tablets (60 mg total) by mouth daily., Disp: 90 tablet, Rfl: 3  Allergies  Allergen Reactions  . Blood-Group Specific Substance     Pt refuses blood         Objective:  Physical Exam  General: AAO x3, NAD  Dermatological:.  Bilateral hallux nails are splitting longitudinal at the distal aspect the nails are from adhered to the nail beds.  The nails are hypertrophic, dystrophic with yellow-brown discoloration.  Subjectively she is getting tenderness nails  1-5 bilaterally.  No open sores.  Vascular: DP, PT pulses 1/4 bilaterally have there is chronic edema present bilaterally.  There is no pain with calf compression, swelling, warmth, erythema.   Neruologic: Sensation decreased with Semmes Weinstein monofilament  Musculoskeletal: No gross boney pedal deformities bilateral. No pain, crepitus, or limitation noted with foot and ankle range of motion bilateral. Muscular strength 5/5 in all groups tested bilateral.      Assessment:   Onychomycosis, nail spliting     Plan:  -Treatment options discussed including all alternatives, risks, and complications -Etiology of symptoms were discussed -ABI was performed the office ensure adequate circulation.  It was 1.14 on the right and 1.12 on the left. -Nails debrided x10 without any  complications or bleeding. -Discussed importance of daily foot inspection.  Return in about 3 months (around 07/13/2019).  Vivi BarrackMatthew R Trevyon Swor DPM

## 2019-04-29 ENCOUNTER — Other Ambulatory Visit: Payer: Self-pay | Admitting: Cardiology

## 2019-07-06 ENCOUNTER — Encounter: Payer: Self-pay | Admitting: Cardiology

## 2019-07-06 ENCOUNTER — Ambulatory Visit (INDEPENDENT_AMBULATORY_CARE_PROVIDER_SITE_OTHER): Payer: Medicare Other | Admitting: Cardiology

## 2019-07-06 ENCOUNTER — Other Ambulatory Visit: Payer: Self-pay

## 2019-07-06 VITALS — BP 137/43 | HR 44 | Temp 97.4°F | Ht 68.0 in | Wt 273.0 lb

## 2019-07-06 DIAGNOSIS — N1831 Chronic kidney disease, stage 3a: Secondary | ICD-10-CM

## 2019-07-06 DIAGNOSIS — I5032 Chronic diastolic (congestive) heart failure: Secondary | ICD-10-CM | POA: Diagnosis not present

## 2019-07-06 DIAGNOSIS — I129 Hypertensive chronic kidney disease with stage 1 through stage 4 chronic kidney disease, or unspecified chronic kidney disease: Secondary | ICD-10-CM | POA: Diagnosis not present

## 2019-07-06 DIAGNOSIS — E119 Type 2 diabetes mellitus without complications: Secondary | ICD-10-CM

## 2019-07-06 DIAGNOSIS — R42 Dizziness and giddiness: Secondary | ICD-10-CM

## 2019-07-06 DIAGNOSIS — Z794 Long term (current) use of insulin: Secondary | ICD-10-CM

## 2019-07-06 DIAGNOSIS — I10 Essential (primary) hypertension: Secondary | ICD-10-CM

## 2019-07-06 NOTE — Patient Instructions (Signed)
Take amlodipine in the evening

## 2019-07-06 NOTE — Progress Notes (Signed)
Primary Physician:  Elwyn Reach, MD   Patient ID: Vickie Pham, female    DOB: 12-19-1935, 83 y.o.   MRN: [id number]  Subjective:    Chief Complaint  Patient presents with  . Hypertension  . Congestive Heart Failure  . Follow-up    3 month  . Obesity    HPI: Vickie Pham  is a 83 y.o. female  with CKD stage 3, uncontrolled type 2 diabetes, nocturnal hypoxemia, osteoarthritis, recurrent bilateral DVT on low dose anticoagulation, and resistant hypertension   Patient has had longstanding history of hypertension that is been uncontrolled for 30 to 40 years. She has negative evaluation for renal artery stenosis by MRI earlier this year.    She is also followed by Dr. Elmarie Shiley for chronic kidney disease stage III. Reports she was recently called by his office and instructed to take potassium supplement every other day.  She underwent echocardiogram on 10/19/2018 revealing moderate LVH, grade 2 diastolic dysfunction, normal LVEF, and mild to moderate pulmonary hypertension.  For bilateral carotid bruits, underwent carotid duplex also at that time revealing very minimal plaque in left internal carotid artery.      She is here on 3 month office visit. Her main complaint is dizziness with sudden position changes and after taking her medications in the morning. Does have intermittent leg edema that is worse some days more than others.  Also has history of lymphedema. Dyspnea on exertion is unchanged. Diabetes is managed by her PCP and she reports is uncontrolled. She does admit that she is not eating appropriately, and has continued to gain weight. She is not active due to the pandemic.   She underwent left hip replacement in September 2019. Reports having to wait 1 year to have surgery due to anemia. She is on O2 therapy at night.    Patient moved from [place] in 2001.  Past Medical History:  Diagnosis Date  . Arthritis   . Carotid stenosis, bilateral   . Diabetes mellitus without  complication ([place])   . DOE (dyspnea on exertion)   . DVT (deep venous thrombosis) ([place])   . Hyperlipidemia   . Hypertension   . Lymphedema of lower extremity     Past Surgical History:  Procedure Laterality Date  . ABDOMINAL HYSTERECTOMY    . CHOLECYSTECTOMY      Social History   Socioeconomic History  . Marital status: Single    Spouse name: Not on file  . Number of children: 4  . Years of education: Not on file  . Highest education level: Not on file  Occupational History  . Not on file  Social Needs  . Financial resource strain: Not on file  . Food insecurity    Worry: Not on file    Inability: Not on file  . Transportation needs    Medical: Not on file    Non-medical: Not on file  Tobacco Use  . Smoking status: Former Smoker    Packs/day: 0.25    Years: 10.00    Pack years: 2.50    Types: Cigarettes    Quit date: 12/30/1969    Years since quitting: 49.5  . Smokeless tobacco: Never Used  Substance and Sexual Activity  . Alcohol use: No    Alcohol/week: 0.0 standard drinks  . Drug use: No  . Sexual activity: Not on file  Lifestyle  . Physical activity    Days per week: Not on file    Minutes per session: Not  on file  . Stress: Not on file  Relationships  . Social Herbalist on phone: Not on file    Gets together: Not on file    Attends religious service: Not on file    Active member of club or organization: Not on file    Attends meetings of clubs or organizations: Not on file    Relationship status: Not on file  . Intimate partner violence    Fear of current or ex partner: Not on file    Emotionally abused: Not on file    Physically abused: Not on file    Forced sexual activity: Not on file  Other Topics Concern  . Not on file  Social History Narrative  . Not on file    Review of Systems  Constitution: Positive for weight gain. Negative for decreased appetite, malaise/fatigue and weight loss.  Eyes: Positive for visual disturbance  (left eye for the last few years).  Cardiovascular: Positive for dyspnea on exertion and leg swelling (intermittent). Negative for chest pain, claudication, orthopnea, palpitations and syncope.  Respiratory: Negative for hemoptysis and wheezing.   Endocrine: Negative for cold intolerance and heat intolerance.  Hematologic/Lymphatic: Does not bruise/bleed easily.  Skin: Negative for nail changes.  Musculoskeletal: Positive for joint pain. Negative for muscle weakness and myalgias.  Gastrointestinal: Negative for abdominal pain, change in bowel habit, nausea and vomiting.  Neurological: Positive for dizziness. Negative for difficulty with concentration, focal weakness and headaches.  Psychiatric/Behavioral: Negative for altered mental status and suicidal ideas.  All other systems reviewed and are negative.     Objective:  Blood pressure (!) 137/43, pulse (!) 44, temperature (!) 97.4 F (36.3 C), height 5' 8" (1.727 m), weight 273 lb (123.8 kg), SpO2 97 %. Body mass index is 41.51 kg/m.    Physical Exam  Constitutional: She is oriented to person, place, and time. Vital signs are normal. She appears well-developed and well-nourished.  Well built, morbidly obese  HENT:  Head: Normocephalic and atraumatic.  Neck: Normal range of motion.  Cardiovascular: Normal rate, regular rhythm, normal heart sounds and intact distal pulses.  Pulses:      Carotid pulses are on the right side with bruit and on the left side with bruit.      Femoral pulses are 1+ on the right side and 1+ on the left side.      Popliteal pulses are 1+ on the right side and 1+ on the left side.       Dorsalis pedis pulses are 1+ on the right side and 1+ on the left side.       Posterior tibial pulses are 1+ on the right side and 1+ on the left side.  Pulses feeble due to bodily habitus. 1 + bilateral leg edema  Pulmonary/Chest: Effort normal and breath sounds normal. No accessory muscle usage. No respiratory distress.   Abdominal: Soft. Bowel sounds are normal.  Musculoskeletal: Normal range of motion.  Neurological: She is alert and oriented to person, place, and time.  Skin: Skin is warm and dry.  Vitals reviewed.  Radiology: No results found.  Laboratory examination:   10/23/2017: RBC 3.6, Hgb 10.5, Hct 31.4, CBC otherwise normal. Glucose 152, Creatinine 1.35, eGFR 37/43, Potassium 4.3, renal function panel. Vit D 42.4  CMP Latest Ref Rng & Units 04/02/2017 04/01/2017 03/05/2017  Glucose 65 - 99 mg/dL 201(H) 55(L) 148(H)  BUN 6 - 20 mg/dL 40(H) 42(H) 34(H)  Creatinine 0.44 - 1.00 mg/dL 1.42(H)  1.47(H) 1.46(H)  Sodium 135 - 145 mmol/L 140 141 140  Potassium 3.5 - 5.1 mmol/L 4.0 3.9 4.4  Chloride 101 - 111 mmol/L 107 108 103  CO2 22 - 32 mmol/L _0 Calcium 8.9 - 10.3 mg/dL 9.0 9.3 9.2  Total Protein 6.5 - 8.1 g/dL - 7.6 -  Total Bilirubin 0.3 - 1.2 mg/dL - 0.4 -  Alkaline Phos 38 - 126 U/L - 61 -  AST 15 - 41 U/L - 22 -  ALT 14 - 54 U/L - 17 -   CBC Latest Ref Rng & Units 04/02/2017 04/01/2017 03/05/2017  WBC 4.0 - 10.5 K/uL 5.5 8.1 7.7  Hemoglobin 12.0 - 15.0 g/dL 10.3(L) 11.2(L) 10.8(L)  Hematocrit 36.0 - 46.0 % 32.3(L) 34.6(L) 33.4(L)  Platelets 150 - 400 K/uL 254 272 299   Lipid Panel     Component Value Date/Time   CHOL 140 12/21/2009 2127   TRIG 123 12/21/2009 2127   HDL 58 12/21/2009 2127   CHOLHDL 2.4 Ratio 12/21/2009 2127   VLDL 25 12/21/2009 2127   LDLCALC 57 12/21/2009 2127   HEMOGLOBIN A1C Lab Results  Component Value Date   HGBA1C 8.2 (H) 03/05/2017   MPG 189 03/05/2017   TSH No results for input(s): TSH in the last 8760 hours.  PRN Meds:. There are no discontinued medications. Current Meds  Medication Sig  . amLODipine (NORVASC) 5 MG tablet TAKE 1 TABLET BY MOUTH ONCE DAILY  . apixaban (ELIQUIS) 2.5 MG TABS tablet Take 2.5 mg by mouth 2 (two) times daily.  Marland Kitchen atorvastatin (LIPITOR) 20 MG tablet Take 20 mg by mouth at bedtime.  . cholecalciferol (VITAMIN D3) 25  MCG (1000 UT) tablet Take 1,000 Units by mouth daily.  . furosemide (LASIX) 40 MG tablet Take 80 mg by mouth 2 (two) times daily.   Marland Kitchen gabapentin (NEURONTIN) 300 MG capsule Take 300 mg by mouth 4 (four) times daily.  . hydrALAZINE (APRESOLINE) 100 MG tablet Take 100 mg by mouth 4 (four) times daily.  . isosorbide mononitrate (IMDUR) 30 MG 24 hr tablet Take 2 tablets (60 mg total) by mouth daily.  Marland Kitchen labetalol (NORMODYNE) 200 MG tablet TAKE 1 TABLET BY MOUTH 2 TIMES A DAY  . LANTUS SOLOSTAR 100 UNIT/ML Solostar Pen Inject 10 Units into the skin daily.   Marland Kitchen lisinopril (PRINIVIL,ZESTRIL) 40 MG tablet Take 40 mg by mouth daily.  . metFORMIN (GLUCOPHAGE) 500 MG tablet Take 1 tablet (500 mg total) by mouth 2 (two) times daily with a meal.  . potassium chloride SA (K-DUR,KLOR-CON) 20 MEQ tablet Take 20 mEq by mouth daily.  . sitaGLIPtin (JANUVIA) 50 MG tablet Take 1 tablet (50 mg total) by mouth daily.  . traMADol (ULTRAM) 50 MG tablet Take 50 mg 3 (three) times daily as needed by mouth for moderate pain.   . vitamin B-12 (CYANOCOBALAMIN) 1000 MCG tablet Take 1,000 mcg by mouth daily.    Cardiac Studies:   MRA abdomen 03/18/2019: 1. No evidence of hemodynamically significant proximal renal artery stenosis. 2. Bilateral renal cysts, largest 18.8 cm left.  Renal artery duplex 10/28/2018: No images of kidneys or renal arteries obtainable. Patient abdomen distended an filled with gas. Aorta is barely visible. The left kidney area contained a large 20 cm x 16 cm simple cyst noted of unknown significance. Recommend MRA with renal protocol if clinically indicted.  Carotid artery duplex 10/19/2018: Minimal plaque in the left internal carotid artery (minimal). Antegrade right vertebral artery flow. Antegrade left  vertebral artery flow. Essentially normal study.  Echocardiogram 10/19/2018 : Left ventricle cavity is normal in size. Moderate concentric hypertrophy of the left ventricle. Normal global  wall motion. Doppler evidence of grade II (pseudonormal) diastolic dysfunction. Diastolic dysfunction findings suggests elevated LA/LV end diastolic pressure. Calculated EF 60%. Left atrial cavity is severely dilated at 5.4 cm. Mild aortic valve leaflet calcification. No aortic valve regurgitation noted. Normal aortic valve leaflet mobility. Mild mitral valve leaflet calcification. Mild (Grade I) mitral regurgitation. Mild tricuspid regurgitation. Mild to moderate pulmonary hypertension. Estimated pulmonary artery systolic pressure 41 mm Hg (CVP 3 )  Assessment:   Resistant hypertension  Chronic diastolic CHF (congestive heart failure) (H[facility])  Stage 3a chronic kidney disease  Dizziness  Diabetes mellitus, type II, insulin dependent ([place]) - Plan: Ambulatory referral to Nutrition and Diabetic Education  EKG 04/06/2019: Sinus bradycardia at 47 bpm, left axis deviation, left anterior fasicular block, possible old anteroseptal infarct. ILBBB. Abnormal EKG.  Recommendations:   Patient is overall doing well she had significant improvement in her pressure with her current medications.  Her main complaint today is dizziness.  I suspect may be related to amlodipine.  No syncope or near syncope.  Have asked her to change to taking it at night to see if this will help.  She will notify me the next days and will make further changes if needed at the time.    She has chronic diastolic heart failure with chronic leg edema.  I have encouraged to try to be more active to prevent any deconditioning. She will not qualify for cardiac rehab with her Medicare. She is followed by Dr. Posey Pronto for CKD.  She is noncompliant with her diet, which she was extensively counseled on today.  Her diabetes is also uncontrolled related to poor diet choices. I feel that she would benefit from individualized diet plan and with diabetes educator/dietician.  She is counseled on the importance of avoiding carbohydrates in her diet.   I will referral to diabetes educator for further management that can help her better understand the foods that she should avoid and needed diet changes.  I will plan to see her back in 3 months for follow up.  Miquel Dunn, MSN, APRN, FNP-C [facility]. [place] Office: [phone] Fax: [phone]

## 2019-07-12 ENCOUNTER — Ambulatory Visit (INDEPENDENT_AMBULATORY_CARE_PROVIDER_SITE_OTHER): Payer: Medicare Other | Admitting: Podiatry

## 2019-07-12 ENCOUNTER — Other Ambulatory Visit: Payer: Self-pay

## 2019-07-12 ENCOUNTER — Encounter: Payer: Self-pay | Admitting: Podiatry

## 2019-07-12 DIAGNOSIS — B351 Tinea unguium: Secondary | ICD-10-CM | POA: Diagnosis not present

## 2019-07-12 DIAGNOSIS — M79675 Pain in left toe(s): Secondary | ICD-10-CM | POA: Diagnosis not present

## 2019-07-12 DIAGNOSIS — M79674 Pain in right toe(s): Secondary | ICD-10-CM | POA: Diagnosis not present

## 2019-07-12 DIAGNOSIS — E1142 Type 2 diabetes mellitus with diabetic polyneuropathy: Secondary | ICD-10-CM

## 2019-07-12 NOTE — Patient Instructions (Signed)

## 2019-07-18 NOTE — Progress Notes (Addendum)
Subjective:  Vickie Pham presents to clinic today with cc of  painful, thick, discolored, elongated toenails 1-5 b/l that become tender and cannot cut because of thickness. Pain is aggravated when wearing enclosed shoe gear.  Patient states she has problems with LE edema. She is taking furosemide 40 gm. She takes 2 tablets every morning, but states she did not take them this morning due to coming to her appointment. She states she will take them once she returns home.  She also has compression hose, but does not wear them due to issues donning them.   Elwyn Reach, MD is her PCP.   Current Outpatient Medications on File Prior to Visit  Medication Sig Dispense Refill  . amLODipine (NORVASC) 5 MG tablet TAKE 1 TABLET BY MOUTH ONCE DAILY 90 tablet 0  . apixaban (ELIQUIS) 2.5 MG TABS tablet Take 2.5 mg by mouth 2 (two) times daily.    Marland Kitchen atorvastatin (LIPITOR) 20 MG tablet Take 20 mg by mouth at bedtime.    . cholecalciferol (VITAMIN D3) 25 MCG (1000 UT) tablet Take 1,000 Units by mouth daily.    . furosemide (LASIX) 40 MG tablet Take 80 mg by mouth 2 (two) times daily.     Marland Kitchen gabapentin (NEURONTIN) 300 MG capsule Take 300 mg by mouth 4 (four) times daily.    . hydrALAZINE (APRESOLINE) 100 MG tablet Take 100 mg by mouth 4 (four) times daily.    . isosorbide mononitrate (IMDUR) 30 MG 24 hr tablet Take 2 tablets (60 mg total) by mouth daily. 90 tablet 3  . labetalol (NORMODYNE) 200 MG tablet TAKE 1 TABLET BY MOUTH 2 TIMES A DAY 180 tablet 0  . LANTUS SOLOSTAR 100 UNIT/ML Solostar Pen Inject 10 Units into the skin daily.     Marland Kitchen lisinopril (PRINIVIL,ZESTRIL) 40 MG tablet Take 40 mg by mouth daily.    . metFORMIN (GLUCOPHAGE) 500 MG tablet Take 1 tablet (500 mg total) by mouth 2 (two) times daily with a meal. 60 tablet 0  . potassium chloride SA (K-DUR,KLOR-CON) 20 MEQ tablet Take 20 mEq by mouth daily.    . sitaGLIPtin (JANUVIA) 50 MG tablet Take 1 tablet (50 mg total) by mouth daily. 30 tablet 0   . traMADol (ULTRAM) 50 MG tablet Take 50 mg 3 (three) times daily as needed by mouth for moderate pain.     . vitamin B-12 (CYANOCOBALAMIN) 1000 MCG tablet Take 1,000 mcg by mouth daily.     No current facility-administered medications on file prior to visit.      Allergies  Allergen Reactions  . Blood-Group Specific Substance     Pt refuses blood     Objective: 83 y.o. AAF, obese in NAD. AAO x 3.   Physical Examination:  Vascular Examination: Capillary refill time < seconds b/l.  DP/PT pulses 1/4 b/l.  Digital hair present b/l.  No edema noted b/l.  Skin temperature gradient WNL b/l.  +2 pedal edema b/l feet/ankles. No pain with calf compression. No blistering, no warmth, no erythema.   Dermatological Examination: Skin with normal turgor, texture and tone b/l.  No open wounds b/l.  No interdigital macerations noted b/l.  Elongated, thick, discolored brittle toenails with subungual debris and pain on dorsal palpation of nailbeds 1-5 b/l.  Musculoskeletal Examination: Muscle strength 4/5 to all muscle groups b/l.  No pain, crepitus or joint discomfort with active/passive ROM.  Neurological Examination: Sensation decreased b/l with 10 gram monofilament.  Assessment: Mycotic nail infection with pain 1-5  b/l Edema NIDDM with neuropathy  Plan: 1. Toenails 1-5 b/l were debrided in length and girth without iatrogenic laceration. 2. Advised patient to elevate feet when at rest to minimize edema.  2.  Continue soft, supportive shoe gear daily. 3.  Report any pedal injuries to medical professional. 4.  Follow up 3 months. 5.  Patient/POA to call should there be a question/concern in there interim.

## 2019-07-20 ENCOUNTER — Other Ambulatory Visit: Payer: Self-pay

## 2019-07-20 ENCOUNTER — Encounter: Payer: Medicare Other | Attending: Internal Medicine | Admitting: Dietician

## 2019-07-20 ENCOUNTER — Encounter: Payer: Self-pay | Admitting: Dietician

## 2019-07-20 DIAGNOSIS — E119 Type 2 diabetes mellitus without complications: Secondary | ICD-10-CM | POA: Diagnosis present

## 2019-07-20 DIAGNOSIS — Z794 Long term (current) use of insulin: Secondary | ICD-10-CM | POA: Diagnosis present

## 2019-07-20 NOTE — Progress Notes (Signed)
Diabetes Self-Management Education  Visit Type: First/Initial  Appt. Start Time: 1405 Appt. End Time: 1305  07/20/2019  [name], identified by name and date of birth, is a 83 y.o. female with a diagnosis of Diabetes: Type 2.   ASSESSMENT  History includes type 2 diabetes, CKD stage 3, nocturnal hypoxemia, osteoarthritis, recurrent bilateral DVT, chronic diastolic heart failure with chronic leg edema, and resistant HTN.   A1C 8% per patient in August 2020 Medications include Lantus 15 units q HS, Metformin and Januvia. Lost 10 lbs recently as she "ran out of junk food".  Patient lives alone.  She does he own shopping once per month.  She takes the [employer] bus.  She does her own cooking.  She has an inconsistent meal schedule and often skips lunch as she eats a late breakfast.  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  [place]  Weight 265 lb (120.2 kg). Body mass index is 40.29 kg/m.  Diabetes Self-Management Education - 07/20/19 1429      Visit Information   Visit Type  First/Initial      Initial Visit   Diabetes Type  Type 2    Are you currently following a meal plan?  No    Are you taking your medications as prescribed?  Yes    Date Diagnosed  2003      Health Coping   How would you rate your overall health?  Good      Psychosocial Assessment   Patient Belief/Attitude about Diabetes  Denial    Self-care barriers  None    Self-management support  Doctor's office    Other persons present  Patient    Patient Concerns  Nutrition/Meal planning    Special Needs  None    Preferred Learning Style  No preference indicated    Learning Readiness  Ready    How often do you need to have someone help you when you read instructions, pamphlets, or other written materials from your doctor or pharmacy?  1 - Never    What is the last grade level you completed in school?  12th grade      Pre-Education Assessment   Patient understands the diabetes  disease and treatment process.  Needs Instruction    Patient understands incorporating nutritional management into lifestyle.  Needs Instruction    Patient undertands incorporating physical activity into lifestyle.  Needs Instruction    Patient understands using medications safely.  Needs Instruction    Patient understands monitoring blood glucose, interpreting and using results  Needs Instruction    Patient understands prevention, detection, and treatment of acute complications.  Needs Instruction    Patient understands prevention, detection, and treatment of chronic complications.  Needs Instruction    Patient understands how to develop strategies to address psychosocial issues.  Needs Instruction    Patient understands how to develop strategies to promote health/change behavior.  Needs Instruction      Complications   Last HgB A1C per patient/outside source  8 %   03/2019 per patient   How often do you check your blood sugar?  0 times/day (not testing)    Have you had a dilated eye exam in the past 12 months?  Yes    Have you had a dental exam in the past 12 months?  No    Are you checking your feet?  No      Dietary Intake   Breakfast  sweet  potato, boiled egg, 8 oz OJ OR eggs, bacon, grits    Lunch  often skips    Snack (afternoon)  occasional peanuts, triscuits, baby bell cheese    Dinner  chicken, applesauce, cornbread OR fried fish, mustard greens, cornbread   5:30-6   Beverage(s)  water, OJ, coffee with 1 sweetened creamer, rare unsweetened tea, Crystal lite      Exercise   Exercise Type  ADL's   uses a rolling walker     Patient Education   Previous Diabetes Education  No    Disease state   Definition of diabetes, type 1 and 2, and the diagnosis of diabetes    Nutrition management   Role of diet in the treatment of diabetes and the relationship between the three main macronutrients and blood glucose level;Meal options for control of blood glucose level and chronic  complications.    Physical activity and exercise   Role of exercise on diabetes management, blood pressure control and cardiac health.;Helped patient identify appropriate exercises in relation to his/her diabetes, diabetes complications and other health issue.    Medications  Taught/reviewed insulin injection, site rotation, insulin storage and needle disposal.;Reviewed patients medication for diabetes, action, purpose, timing of dose and side effects.    Monitoring  Purpose and frequency of SMBG.;Identified appropriate SMBG and/or A1C goals.;Daily foot exams;Yearly dilated eye exam    Acute complications  Taught treatment of hypoglycemia - the 15 rule.    Chronic complications  Relationship between chronic complications and blood glucose control      Individualized Goals (developed by patient)   Nutrition  General guidelines for healthy choices and portions discussed    Physical Activity  Exercise 3-5 times per week;15 minutes per day    Medications  take my medication as prescribed    Monitoring   test my blood glucose as discussed    Reducing Risk  do foot checks daily;increase portions of healthy fats;treat hypoglycemia with 15 grams of carbs if blood glucose less than 70mg /dL    Health Coping  discuss diabetes with (comment)   MD, RD, CDE     Post-Education Assessment   Patient understands the diabetes disease and treatment process.  Demonstrates understanding / competency    Patient understands incorporating nutritional management into lifestyle.  Needs Review    Patient undertands incorporating physical activity into lifestyle.  Demonstrates understanding / competency    Patient understands using medications safely.  Demonstrates understanding / competency    Patient understands monitoring blood glucose, interpreting and using results  Demonstrates understanding / competency    Patient understands prevention, detection, and treatment of acute complications.  Demonstrates understanding /  competency    Patient understands prevention, detection, and treatment of chronic complications.  Demonstrates understanding / competency    Patient understands how to develop strategies to address psychosocial issues.  Demonstrates understanding / competency    Patient understands how to develop strategies to promote health/change behavior.  Demonstrates understanding / competency      Outcomes   Expected Outcomes  Demonstrated interest in learning. Expect positive outcomes    Future DMSE  PRN    Program Status  Completed       Individualized Plan for Diabetes Self-Management Training:   Learning Objective:  Patient will have a greater understanding of diabetes self-management. Patient education plan is to attend individual and/or group sessions per assessed needs and concerns.   Plan:   Patient Instructions  Plan:  Aim for 2-3 Carb Choices per meal (  30-45 grams) +/- 1 either way  Aim for 0-1 Carbs per snack if hungry  Include protein in moderation with your meals and snacks Consider reading food labels for Total Carbohydrate of foods Consider  increasing your activity level by armchair exercise for 15 minutes daily as tolerated Consider checking BG at alternate times per day  Continue taking medication as directed by MD  Rotate your insulin injection site       Expected Outcomes:  Demonstrated interest in learning. Expect positive outcomes  Education material provided: ADA - How to Thrive: A Guide for Your Journey with Diabetes, Meal plan card and Snack sheet  If problems or questions, patient to contact team via:  Phone  Future DSME appointment: PRN

## 2019-07-20 NOTE — Patient Instructions (Signed)
Plan:  Aim for 2-3 Carb Choices per meal (30-45 grams) +/- 1 either way  Aim for 0-1 Carbs per snack if hungry  Include protein in moderation with your meals and snacks Consider reading food labels for Total Carbohydrate of foods Consider  increasing your activity level by armchair exercise for 15 minutes daily as tolerated Consider checking BG at alternate times per day  Continue taking medication as directed by MD  Rotate your insulin injection site

## 2019-07-27 ENCOUNTER — Other Ambulatory Visit: Payer: Self-pay | Admitting: Cardiology

## 2019-10-04 ENCOUNTER — Other Ambulatory Visit: Payer: Self-pay

## 2019-10-04 ENCOUNTER — Encounter: Payer: Self-pay | Admitting: Podiatry

## 2019-10-04 ENCOUNTER — Ambulatory Visit (INDEPENDENT_AMBULATORY_CARE_PROVIDER_SITE_OTHER): Payer: Medicare Other | Admitting: Podiatry

## 2019-10-04 DIAGNOSIS — M79675 Pain in left toe(s): Secondary | ICD-10-CM | POA: Diagnosis not present

## 2019-10-04 DIAGNOSIS — M79674 Pain in right toe(s): Secondary | ICD-10-CM | POA: Diagnosis not present

## 2019-10-04 DIAGNOSIS — E1142 Type 2 diabetes mellitus with diabetic polyneuropathy: Secondary | ICD-10-CM

## 2019-10-04 DIAGNOSIS — B351 Tinea unguium: Secondary | ICD-10-CM

## 2019-10-04 NOTE — Patient Instructions (Signed)
Diabetes Mellitus and Foot Care Foot care is an important part of your health, especially when you have diabetes. Diabetes may cause you to have problems because of poor blood flow (circulation) to your feet and legs, which can cause your skin to:  Become thinner and drier.  Break more easily.  Heal more slowly.  Peel and crack. You may also have nerve damage (neuropathy) in your legs and feet, causing decreased feeling in them. This means that you may not notice minor injuries to your feet that could lead to more serious problems. Noticing and addressing any potential problems early is the best way to prevent future foot problems. How to care for your feet Foot hygiene  Wash your feet daily with warm water and mild soap. Do not use hot water. Then, pat your feet and the areas between your toes until they are completely dry. Do not soak your feet as this can dry your skin.  Trim your toenails straight across. Do not dig under them or around the cuticle. File the edges of your nails with an emery board or nail file.  Apply a moisturizing lotion or petroleum jelly to the skin on your feet and to dry, brittle toenails. Use lotion that does not contain alcohol and is unscented. Do not apply lotion between your toes. Shoes and socks  Wear clean socks or stockings every day. Make sure they are not too tight. Do not wear knee-high stockings since they may decrease blood flow to your legs.  Wear shoes that fit properly and have enough cushioning. Always look in your shoes before you put them on to be sure there are no objects inside.  To break in new shoes, wear them for just a few hours a day. This prevents injuries on your feet. Wounds, scrapes, corns, and calluses  Check your feet daily for blisters, cuts, bruises, sores, and redness. If you cannot see the bottom of your feet, use a mirror or ask someone for help.  Do not cut corns or calluses or try to remove them with medicine.  If you  find a minor scrape, cut, or break in the skin on your feet, keep it and the skin around it clean and dry. You may clean these areas with mild soap and water. Do not clean the area with peroxide, alcohol, or iodine.  If you have a wound, scrape, corn, or callus on your foot, look at it several times a day to make sure it is healing and not infected. Check for: ? Redness, swelling, or pain. ? Fluid or blood. ? Warmth. ? Pus or a bad smell. General instructions  Do not cross your legs. This may decrease blood flow to your feet.  Do not use heating pads or hot water bottles on your feet. They may burn your skin. If you have lost feeling in your feet or legs, you may not know this is happening until it is too late.  Protect your feet from hot and cold by wearing shoes, such as at the beach or on hot pavement.  Schedule a complete foot exam at least once a year (annually) or more often if you have foot problems. If you have foot problems, report any cuts, sores, or bruises to your health care provider immediately. Contact a health care provider if:  You have a medical condition that increases your risk of infection and you have any cuts, sores, or bruises on your feet.  You have an injury that is not   healing.  You have redness on your legs or feet.  You feel burning or tingling in your legs or feet.  You have pain or cramps in your legs and feet.  Your legs or feet are numb.  Your feet always feel cold.  You have pain around a toenail. Get help right away if:  You have a wound, scrape, corn, or callus on your foot and: ? You have pain, swelling, or redness that gets worse. ? You have fluid or blood coming from the wound, scrape, corn, or callus. ? Your wound, scrape, corn, or callus feels warm to the touch. ? You have pus or a bad smell coming from the wound, scrape, corn, or callus. ? You have a fever. ? You have a red line going up your leg. Summary  Check your feet every day  for cuts, sores, red spots, swelling, and blisters.  Moisturize feet and legs daily.  Wear shoes that fit properly and have enough cushioning.  If you have foot problems, report any cuts, sores, or bruises to your health care provider immediately.  Schedule a complete foot exam at least once a year (annually) or more often if you have foot problems. This information is not intended to replace advice given to you by your health care provider. Make sure you discuss any questions you have with your health care provider. Document Revised: 05/05/2019 Document Reviewed: 09/13/2016 Elsevier Patient Education  2020 Elsevier Inc.  

## 2019-10-06 ENCOUNTER — Other Ambulatory Visit: Payer: Self-pay

## 2019-10-06 ENCOUNTER — Ambulatory Visit (INDEPENDENT_AMBULATORY_CARE_PROVIDER_SITE_OTHER): Payer: Medicare Other | Admitting: Cardiology

## 2019-10-06 ENCOUNTER — Encounter: Payer: Self-pay | Admitting: Cardiology

## 2019-10-06 VITALS — BP 131/51 | HR 44 | Temp 97.6°F | Ht 68.0 in | Wt 261.0 lb

## 2019-10-06 DIAGNOSIS — R5383 Other fatigue: Secondary | ICD-10-CM

## 2019-10-06 DIAGNOSIS — Z794 Long term (current) use of insulin: Secondary | ICD-10-CM

## 2019-10-06 DIAGNOSIS — I10 Essential (primary) hypertension: Secondary | ICD-10-CM

## 2019-10-06 DIAGNOSIS — N1831 Chronic kidney disease, stage 3a: Secondary | ICD-10-CM

## 2019-10-06 DIAGNOSIS — I5032 Chronic diastolic (congestive) heart failure: Secondary | ICD-10-CM

## 2019-10-06 DIAGNOSIS — I129 Hypertensive chronic kidney disease with stage 1 through stage 4 chronic kidney disease, or unspecified chronic kidney disease: Secondary | ICD-10-CM

## 2019-10-06 DIAGNOSIS — E119 Type 2 diabetes mellitus without complications: Secondary | ICD-10-CM

## 2019-10-06 NOTE — Progress Notes (Signed)
Primary Physician:  Elwyn Reach, MD   Patient ID: Vickie Pham, female    DOB: 11/08/1935, 84 y.o.   MRN: [id number]  Subjective:    Chief Complaint  Patient presents with   Hypertension   Follow-up    HPI: Vickie Pham  is a 84 y.o. female  with CKD stage 3, uncontrolled type 2 diabetes, nocturnal hypoxemia, osteoarthritis, recurrent bilateral DVT on low dose anticoagulation, and resistant hypertension   Patient has had longstanding history of hypertension that is been uncontrolled for 30 to 40 years. She has negative evaluation for renal artery stenosis by MRI earlier this year. Last seen 3 months ago, now presents for follow up on uncontrolled hypertension.   No complaints today. Dyspnea on exertion is stable. Dizziness has improved. She is also followed by Dr. Elmarie Shiley for chronic kidney disease stage III.     During the interim, has established with dietician to help with controlling her diabetes. She has lost some weight since this and is making diet changes. States that her diabetes has significantly improved.   She underwent left hip replacement in September 2019. Reports having to wait 1 year to have surgery due to anemia. She is on O2 therapy at night.    Patient moved from [place] in 2001.  Past Medical History:  Diagnosis Date   Arthritis    Carotid stenosis, bilateral    Diabetes mellitus without complication (HCC)    DOE (dyspnea on exertion)    DVT (deep venous thrombosis) (HCC)    Hyperlipidemia    Hypertension    Lymphedema of lower extremity     Past Surgical History:  Procedure Laterality Date   ABDOMINAL HYSTERECTOMY     CHOLECYSTECTOMY      Social History   Socioeconomic History   Marital status: Single    Spouse name: Not on file   Number of children: 4   Years of education: Not on file   Highest education level: Not on file  Occupational History   Not on file  Tobacco Use   Smoking status: Former Smoker   Packs/day: 0.25    Years: 10.00    Pack years: 2.50    Types: Cigarettes    Quit date: 12/30/1969    Years since quitting: 49.8   Smokeless tobacco: Never Used  Substance and Sexual Activity   Alcohol use: No    Alcohol/week: 0.0 standard drinks   Drug use: No   Sexual activity: Not on file  Other Topics Concern   Not on file  Social History Narrative   Not on file   Social Determinants of Health   Financial Resource Strain:    Difficulty of Paying Living Expenses: Not on file  Food Insecurity:    Worried About Charity fundraiser in the Last Year: Not on file   [employer] of Food in the Last Year: Not on file  Transportation Needs:    Lack of Transportation (Medical): Not on file   Lack of Transportation (Non-Medical): Not on file  Physical Activity:    Days of Exercise per Week: Not on file   Minutes of Exercise per Session: Not on file  Stress:    Feeling of Stress : Not on file  Social Connections:    Frequency of Communication with Friends and Family: Not on file   Frequency of Social Gatherings with Friends and Family: Not on file   Attends Religious Services: Not on file   Active Member of  Clubs or Organizations: Not on file   Attends Archivist Meetings: Not on file   Marital Status: Not on file  Intimate Partner Violence:    Fear of Current or Ex-Partner: Not on file   Emotionally Abused: Not on file   Physically Abused: Not on file   Sexually Abused: Not on file    Review of Systems  Constitution: Positive for weight gain. Negative for decreased appetite, malaise/fatigue and weight loss.  Eyes: Positive for visual disturbance (left eye for the last few years).  Cardiovascular: Positive for dyspnea on exertion and leg swelling (intermittent). Negative for chest pain, claudication, orthopnea, palpitations and syncope.  Respiratory: Negative for hemoptysis and wheezing.   Endocrine: Negative for cold intolerance and heat  intolerance.  Hematologic/Lymphatic: Does not bruise/bleed easily.  Skin: Negative for nail changes.  Musculoskeletal: Positive for joint pain. Negative for muscle weakness and myalgias.  Gastrointestinal: Negative for abdominal pain, change in bowel habit, nausea and vomiting.  Neurological: Positive for dizziness. Negative for difficulty with concentration, focal weakness and headaches.  Psychiatric/Behavioral: Negative for altered mental status and suicidal ideas.  All other systems reviewed and are negative.     Objective:  Blood pressure (!) 131/51, pulse (!) 44, temperature 97.6 F (36.4 C), height 5' 8" (1.727 m), weight 261 lb (118.4 kg), SpO2 96 %. Body mass index is 39.68 kg/m.    Physical Exam  Constitutional: She is oriented to person, place, and time. Vital signs are normal. She appears well-developed and well-nourished.  Well built, morbidly obese  HENT:  Head: Normocephalic and atraumatic.  Cardiovascular: Normal rate, regular rhythm, normal heart sounds and intact distal pulses.  Pulses:      Carotid pulses are on the right side with bruit and on the left side with bruit.      Femoral pulses are 1+ on the right side and 1+ on the left side.      Popliteal pulses are 1+ on the right side and 1+ on the left side.       Dorsalis pedis pulses are 1+ on the right side and 1+ on the left side.       Posterior tibial pulses are 1+ on the right side and 1+ on the left side.  Pulses feeble due to bodily habitus. 1 + bilateral leg edema  Pulmonary/Chest: Effort normal and breath sounds normal. No accessory muscle usage. No respiratory distress.  Abdominal: Soft. Bowel sounds are normal.  Musculoskeletal:        General: Normal range of motion.     Cervical back: Normal range of motion.  Neurological: She is alert and oriented to person, place, and time.  Skin: Skin is warm and dry.  Vitals reviewed.  Radiology: No results found.  Laboratory examination:   10/23/2017:  RBC 3.6, Hgb 10.5, Hct 31.4, CBC otherwise normal. Glucose 152, Creatinine 1.35, eGFR 37/43, Potassium 4.3, renal function panel. Vit D 42.4  CMP Latest Ref Rng & Units 04/02/2017 04/01/2017 03/05/2017  Glucose 65 - 99 mg/dL 201(H) 55(L) 148(H)  BUN 6 - 20 mg/dL 40(H) 42(H) 34(H)  Creatinine 0.44 - 1.00 mg/dL 1.42(H) 1.47(H) 1.46(H)  Sodium 135 - 145 mmol/L 140 141 140  Potassium 3.5 - 5.1 mmol/L 4.0 3.9 4.4  Chloride 101 - 111 mmol/L 107 108 103  CO2 22 - 32 mmol/L _0 Calcium 8.9 - 10.3 mg/dL 9.0 9.3 9.2  Total Protein 6.5 - 8.1 g/dL - 7.6 -  Total Bilirubin 0.3 -  1.2 mg/dL - 0.4 -  Alkaline Phos 38 - 126 U/L - 61 -  AST 15 - 41 U/L - 22 -  ALT 14 - 54 U/L - 17 -   CBC Latest Ref Rng & Units 04/02/2017 04/01/2017 03/05/2017  WBC 4.0 - 10.5 K/uL 5.5 8.1 7.7  Hemoglobin 12.0 - 15.0 g/dL 10.3(L) 11.2(L) 10.8(L)  Hematocrit 36.0 - 46.0 % 32.3(L) 34.6(L) 33.4(L)  Platelets 150 - 400 K/uL 254 272 299   Lipid Panel     Component Value Date/Time   CHOL 140 12/21/2009 2127   TRIG 123 12/21/2009 2127   HDL 58 12/21/2009 2127   CHOLHDL 2.4 Ratio 12/21/2009 2127   VLDL 25 12/21/2009 2127   LDLCALC 57 12/21/2009 2127   HEMOGLOBIN A1C Lab Results  Component Value Date   HGBA1C 8.2 (H) 03/05/2017   MPG 189 03/05/2017   TSH No results for input(s): TSH in the last 8760 hours.  PRN Meds:. There are no discontinued medications. Current Meds  Medication Sig   amLODipine (NORVASC) 5 MG tablet TAKE 1 TABLET BY MOUTH ONCE DAILY   apixaban (ELIQUIS) 2.5 MG TABS tablet Take 2.5 mg by mouth 2 (two) times daily.   atorvastatin (LIPITOR) 20 MG tablet Take 20 mg by mouth at bedtime.   cholecalciferol (VITAMIN D3) 25 MCG (1000 UT) tablet Take 1,000 Units by mouth daily.   ferrous sulfate 325 (65 FE) MG EC tablet Take 325 mg by mouth 3 (three) times daily with meals.   furosemide (LASIX) 40 MG tablet Take 80 mg by mouth 2 (two) times daily.    gabapentin (NEURONTIN) 300 MG capsule Take 300  mg by mouth 4 (four) times daily.   hydrALAZINE (APRESOLINE) 100 MG tablet Take 100 mg by mouth 4 (four) times daily.   isosorbide mononitrate (IMDUR) 30 MG 24 hr tablet Take 2 tablets (60 mg total) by mouth daily.   labetalol (NORMODYNE) 200 MG tablet TAKE 1 TABLET BY MOUTH 2 TIMES A DAY   LANTUS SOLOSTAR 100 UNIT/ML Solostar Pen Inject 10 Units into the skin daily.    lisinopril (PRINIVIL,ZESTRIL) 40 MG tablet Take 40 mg by mouth daily.   metFORMIN (GLUCOPHAGE) 500 MG tablet Take 1 tablet (500 mg total) by mouth 2 (two) times daily with a meal.   potassium chloride SA (K-DUR,KLOR-CON) 20 MEQ tablet Take 20 mEq by mouth daily.   sitaGLIPtin (JANUVIA) 50 MG tablet Take 1 tablet (50 mg total) by mouth daily.   traMADol (ULTRAM) 50 MG tablet Take 50 mg 3 (three) times daily as needed by mouth for moderate pain.    vitamin B-12 (CYANOCOBALAMIN) 1000 MCG tablet Take 1,000 mcg by mouth daily.    Cardiac Studies:   MRA abdomen 03/18/2019: 1. No evidence of hemodynamically significant proximal renal artery stenosis. 2. Bilateral renal cysts, largest 18.8 cm left.  Renal artery duplex 10/28/2018: No images of kidneys or renal arteries obtainable. Patient abdomen distended an filled with gas. Aorta is barely visible. The left kidney area contained a large 20 cm x 16 cm simple cyst noted of unknown significance. Recommend MRA with renal protocol if clinically indicted.  Carotid artery duplex 10/19/2018: Minimal plaque in the left internal carotid artery (minimal). Antegrade right vertebral artery flow. Antegrade left vertebral artery flow. Essentially normal study.  Echocardiogram 10/19/2018 : Left ventricle cavity is normal in size. Moderate concentric hypertrophy of the left ventricle. Normal global wall motion. Doppler evidence of grade II (pseudonormal) diastolic dysfunction. Diastolic dysfunction findings suggests elevated  LA/LV end diastolic pressure. Calculated EF 60%. Left  atrial cavity is severely dilated at 5.4 cm. Mild aortic valve leaflet calcification. No aortic valve regurgitation noted. Normal aortic valve leaflet mobility. Mild mitral valve leaflet calcification. Mild (Grade I) mitral regurgitation. Mild tricuspid regurgitation. Mild to moderate pulmonary hypertension. Estimated pulmonary artery systolic pressure 41 mm Hg (CVP 3 )  Assessment:   Resistant hypertension  Chronic diastolic CHF (congestive heart failure) (HCC)  Fatigue, unspecified type - Plan: Ambulatory referral to Neurology  Diabetes mellitus, type II, insulin dependent ([place])  Stage 3a chronic kidney disease  EKG 04/06/2019: Sinus bradycardia at 47 bpm, left axis deviation, left anterior fasicular block, possible old anteroseptal infarct. ILBBB. Abnormal EKG.  Recommendations:   Patient is here for 30-monthoffice visit and follow-up for hypertension.  Hypertension has been well controlled with current medications, will continue the same.  She has been making dietary and lifestyle changes and since doing this, her diabetes has been much better controlled and she has also lost a few pounds. I have strongly encouraged her to continue with this. No clinical evidence of decompensated heart failure. Dyspnea has been stable. She will continue to follow with Dr. JElmarie Shileyfor management of CKD stage 3 that she states is stable.   She does complain of daytime fatigue, question if she has underlying sleep apnea. She is currently using oxygen therapy at night. I will place referral to see Dr. DMaureen Chattersfor further evaluation. I will see her back in 6 months, but encouraged her to contact me sooner if needed.   AMiquel Dunn MSN, APRN, FNP-C PKear[facility] IncCardiovascular. PThomasvilleOffice: [phone]: [phone]

## 2019-10-07 NOTE — Progress Notes (Signed)
Subjective: Vickie Pham presents today for follow up of preventative diabetic foot care and painful mycotic nails b/l that are difficult to trim. Pain interferes with ambulation. Aggravating factors include wearing enclosed shoe gear. Pain is relieved with periodic professional debridement.   She states Aspercreme helps with her neuropathy pain and is requesting we send orders to her [facility] for her aide to apply Aspercreme to her feet three times weekly (on Monday, Wednesday, and Friday). Her fear is it won't be performed without formal orders.   Allergies  Allergen Reactions  . Blood-Group Specific Substance     Pt refuses blood     Objective: There were no vitals filed for this visit.  Vascular Examination:  Capillary fill time to digits <3s b/l, faintly palpable DP pulses b/l, faintly palpable PT pulses b/l, pedal hair sparse b/l, skin temperature gradient within normal limits b/l and +1 pitting edema b/l LE  Dermatological Examination: Pedal skin with normal turgor, texture and tone bilaterally, no open wounds bilaterally, no interdigital macerations bilaterally and toenails 1-5 b/l elongated, dystrophic, thickened, crumbly with subungual debris  Musculoskeletal: Muscle strength 4/5 b/l, no gross bony deformities bilaterally and no pain crepitus or joint limitation noted with ROM b/l  Neurological: Protective sensation decreased with 10 gram monofilament b/l  Assessment: Pain due to onychomycosis of toenails of both feet  Diabetic peripheral neuropathy associated with type 2 diabetes mellitus (HCC)  Plan: -Continue diabetic foot care principles. Literature dispensed on today.  -Per patient request, will fax orders for her to: [name]  [phone] [address]  [address]  [place], [place] [address]  Phone [phone]  Fax: [phone]  Attention Nursing Supervisor:  Orders for home health aide:  Please apply Aspercreme to both feet every  Monday, Wednesday, and Friday.   -Toenails 1-5 b/l were debrided in length and girth without iatrogenic bleeding. -Patient to continue soft, supportive shoe gear daily. -Patient to report any pedal injuries to medical professional immediately. -Patient/POA to call should there be question/concern in the interim.  Return in 3 months (on 01/01/2020) for diabetic nail trim/ Eliquis.

## 2019-10-08 ENCOUNTER — Telehealth: Payer: Self-pay | Admitting: *Deleted

## 2019-10-08 NOTE — Telephone Encounter (Signed)
-----   Message from Freddie Breech, DPM sent at 10/07/2019  1:15 PM EST ----- Regarding: Orders for Home Health Aide Instructions to [facility] Good afternoon! I sent this message to Val not realizing she went out sick again, so I'm resending in hopes of getting these orders faxed for my patient.  Can you please fax orders to:   [name]  [phone] [address]  [address]  [place], [place] [address]  Phone [phone]  Fax: [phone]   Attention Nursing Supervisor:   Regarding patient:  Vickie Pham TWS#[id number]  (Phone: [phone])  [address]  [address]  [place], [place] [address]   Orders for home health aide:  Please apply Aspercreme to both feet every Monday, Wednesday, and Friday.   Thanks!   Dr. Eloy End

## 2019-10-08 NOTE — Telephone Encounter (Signed)
Per Dr Duayne Cal faxed the orders over to the nurse Supervisor at [facility] and the fax number is [phone]. Misty Stanley

## 2019-10-13 ENCOUNTER — Ambulatory Visit (INDEPENDENT_AMBULATORY_CARE_PROVIDER_SITE_OTHER): Payer: Medicare Other | Admitting: Neurology

## 2019-10-13 ENCOUNTER — Encounter: Payer: Self-pay | Admitting: Neurology

## 2019-10-13 ENCOUNTER — Other Ambulatory Visit: Payer: Self-pay

## 2019-10-13 VITALS — BP 165/59 | HR 42 | Temp 97.1°F | Ht 68.0 in | Wt 260.0 lb

## 2019-10-13 DIAGNOSIS — N1832 Chronic kidney disease, stage 3b: Secondary | ICD-10-CM

## 2019-10-13 DIAGNOSIS — E119 Type 2 diabetes mellitus without complications: Secondary | ICD-10-CM | POA: Diagnosis not present

## 2019-10-13 DIAGNOSIS — I739 Peripheral vascular disease, unspecified: Secondary | ICD-10-CM

## 2019-10-13 DIAGNOSIS — G4724 Circadian rhythm sleep disorder, free running type: Secondary | ICD-10-CM

## 2019-10-13 DIAGNOSIS — G4719 Other hypersomnia: Secondary | ICD-10-CM | POA: Insufficient documentation

## 2019-10-13 DIAGNOSIS — Z6839 Body mass index (BMI) 39.0-39.9, adult: Secondary | ICD-10-CM

## 2019-10-13 DIAGNOSIS — Z794 Long term (current) use of insulin: Secondary | ICD-10-CM | POA: Diagnosis not present

## 2019-10-13 DIAGNOSIS — I10 Essential (primary) hypertension: Secondary | ICD-10-CM

## 2019-10-13 DIAGNOSIS — R351 Nocturia: Secondary | ICD-10-CM | POA: Insufficient documentation

## 2019-10-13 NOTE — Progress Notes (Signed)
SLEEP MEDICINE CLINIC    Provider:  Melvyn Novas, MD  Primary Care Physician:    Rometta Emery, MD [address] [place] [address]     Referring Provider: Dr Jacinto Halim, MD       Chief Complaint according to patient   Patient presents with:    . New Patient (Initial Visit)           HISTORY OF PRESENT ILLNESS:  Vickie Pham is a 84 year old African American female patient seen here on 10/13/2019  Upon referral by Delman Kitten NP . Chief concern according to patient : " I don't sleep "    I have the pleasure of seeing Vickie Pham today, a right -handed African American female with a possible sleep disorder.  She  has a past medical history of Arthritis,morbis obesity grade 2 - uncontrolled hypertension,  Carotid stenosis bilateral, Diabetes mellitus - complication (HCC),  CKD grade 3 , DOE (dyspnea on exertion), DVT (deep venous thrombosis) (HCC), Hyperlipidemia, Hypertension, and Lymphedema of lower extremity.   She reports lifelong insomnia. She also feels excessively daytime sleepy. She has been watching Tv in the night, The patient also reports that her mother was oxygen dependent at night and that she is on oxygen at night only,  Sleep relevant medical history: Nocturia- 3 times, she reports a very dry mouth plus she is on diuretics and has to have a high fluid intake.  No history of cervical spine injury, ENT surgery or traumatic brain injury.    Family medical /sleep history:daughter with OSA,.    Social history:  Patient is retired from Engineering geologist in [place] and lives in a household alone. Family status is widowed, with 4 adult children, 11 grandchildren. Pets are not present.Tobacco use - quit 50 years ago-.  ETOH use ; none,  Caffeine intake in form of Coffee( every morning 1 mug) Soda( none) Tea ( 2-3 /week) , and no energy drinks. Regular exercise - PT and none other since hip replacement ( 2 years ago)    Sleep habits are as follows:The patient's  dinner time is variable- between 5-8 PM. The patient spends the evening watching TV, seated - goes to bed and watches more TV ( adjustable flex frame bed)  at midnight - and may be asleep at 12-4 AM , she continues to sleep for 3- 4 hours, wakes for bathroom breaks, and also from leg cramping. She is more seated than supine.  The preferred sleep position is reclined, with the support of several  pillows.  Dreams are reportedly frequent/vivid.  9 AM is the latest  rise time. The patient wakes up spontaneously  She reports not feeling refreshed or restored in AM, with symptoms such as dry mouth, dry eyes, morning headaches, neck pain and A LOT OF residual fatigue.  Naps are taken infrequently, WHENEVER IN FRONT OF THE TV = lasting from 5-20 minutes and are more refreshing than nocturnal sleep.    Review of Systems: Out of a complete 14 system review, the patient complains of only the following symptoms, and all other reviewed systems are negative.:  Fatigue, sleepiness , snoring, fragmented sleep, Insomnia - poor l sleep habits, TV in the bedroom, sleeping in a seated position, bed room is not cool, quiet and dark. Leg cramping every night, but not in daytime.  Burning feet, ankle arthritis, knee and hip arthritis.    How likely are you to doze in the following  situations: 0 = not likely, 1 = slight chance, 2 = moderate chance, 3 = high chance   Sitting and Reading? 3 Watching Television? 3 Sitting inactive in a public place (theater or meeting)? 2 As a passenger in a car for an hour without a break? 3 Lying down in the afternoon when circumstances permit?3 Sitting and talking to someone?2 Sitting quietly after lunch without alcohol?3 In a car, while stopped for a few minutes in traffic?2   Total =  21 / 24 points     FSS endorsed at 54/ 63 points.   GDS 4/ 15 points - not clinically depressed.   Social History   Socioeconomic History  . Marital status: Single    Spouse name: Not on  file  . Number of children: 4  . Years of education: Not on file  . Highest education level: Not on file  Occupational History  . Not on file  Tobacco Use  . Smoking status: Former Smoker    Packs/day: 0.25    Years: 10.00    Pack years: 2.50    Types: Cigarettes    Quit date: 12/30/1969    Years since quitting: 49.8  . Smokeless tobacco: Never Used  Substance and Sexual Activity  . Alcohol use: No    Alcohol/week: 0.0 standard drinks  . Drug use: No  . Sexual activity: Not on file  Other Topics Concern  . Not on file  Social History Narrative  . Not on file   Social Determinants of Health   Financial Resource Strain:   . Difficulty of Paying Living Expenses: Not on file  Food Insecurity:   . Worried About Programme researcher, broadcasting/film/video in the Last Year: Not on file  . Ran Out of Food in the Last Year: Not on file  Transportation Needs:   . Lack of Transportation (Medical): Not on file  . Lack of Transportation (Non-Medical): Not on file  Physical Activity:   . Days of Exercise per Week: Not on file  . Minutes of Exercise per Session: Not on file  Stress:   . Feeling of Stress : Not on file  Social Connections:   . Frequency of Communication with Friends and Family: Not on file  . Frequency of Social Gatherings with Friends and Family: Not on file  . Attends Religious Services: Not on file  . Active Member of Clubs or Organizations: Not on file  . Attends Banker Meetings: Not on file  . Marital Status: Not on file    Family History  Problem Relation Age of Onset  . Heart disease Mother   . Heart failure Mother   . Hypertension Mother   . Heart attack Father   . Diabetes Sister   . Heart disease Brother   . Breast cancer Neg Hx     Past Medical History:  Diagnosis Date  . Arthritis   . Carotid stenosis, bilateral   . Diabetes mellitus without complication (HCC)   . DOE (dyspnea on exertion)   . DVT (deep venous thrombosis) (HCC)   . Hyperlipidemia     . Hypertension   . Lymphedema of lower extremity     Past Surgical History:  Procedure Laterality Date  . ABDOMINAL HYSTERECTOMY    . CHOLECYSTECTOMY       Current Outpatient Medications on File Prior to Visit  Medication Sig Dispense Refill  . amLODipine (NORVASC) 5 MG tablet TAKE 1 TABLET BY MOUTH ONCE DAILY 90 tablet 0  .  apixaban (ELIQUIS) 2.5 MG TABS tablet Take 2.5 mg by mouth 2 (two) times daily.    Marland Kitchen atorvastatin (LIPITOR) 20 MG tablet Take 20 mg by mouth at bedtime.    . cholecalciferol (VITAMIN D3) 25 MCG (1000 UT) tablet Take 1,000 Units by mouth daily.    . ferrous sulfate 325 (65 FE) MG EC tablet Take 325 mg by mouth 3 (three) times daily with meals.    . furosemide (LASIX) 40 MG tablet Take 80 mg by mouth 2 (two) times daily.     Marland Kitchen gabapentin (NEURONTIN) 300 MG capsule Take 300 mg by mouth 4 (four) times daily.    . hydrALAZINE (APRESOLINE) 100 MG tablet Take 100 mg by mouth 4 (four) times daily.    Marland Kitchen labetalol (NORMODYNE) 200 MG tablet TAKE 1 TABLET BY MOUTH 2 TIMES A DAY 180 tablet 0  . LANTUS SOLOSTAR 100 UNIT/ML Solostar Pen Inject 10 Units into the skin daily.     Marland Kitchen lisinopril (PRINIVIL,ZESTRIL) 40 MG tablet Take 40 mg by mouth daily.    . metFORMIN (GLUCOPHAGE) 500 MG tablet Take 1 tablet (500 mg total) by mouth 2 (two) times daily with a meal. 60 tablet 0  . potassium chloride SA (K-DUR,KLOR-CON) 20 MEQ tablet Take 20 mEq by mouth daily.    . sitaGLIPtin (JANUVIA) 50 MG tablet Take 1 tablet (50 mg total) by mouth daily. 30 tablet 0  . traMADol (ULTRAM) 50 MG tablet Take 50 mg 3 (three) times daily as needed by mouth for moderate pain.     . vitamin B-12 (CYANOCOBALAMIN) 1000 MCG tablet Take 1,000 mcg by mouth daily.    . isosorbide mononitrate (IMDUR) 30 MG 24 hr tablet Take 2 tablets (60 mg total) by mouth daily. 90 tablet 3   No current facility-administered medications on file prior to visit.    Allergies  Allergen Reactions  . Blood-Group Specific  Substance     Pt refuses blood    Physical exam:  Today's Vitals   10/13/19 1455  BP: (!) 165/59  Pulse: (!) 42  Temp: (!) 97.1 F (36.2 C)  Weight: 260 lb (117.9 kg)  Height: 5\' 8"  (1.727 m)   Body mass index is 39.53 kg/m.   Wt Readings from Last 3 Encounters:  10/13/19 260 lb (117.9 kg)  10/06/19 261 lb (118.4 kg)  07/20/19 265 lb (120.2 kg)     Ht Readings from Last 3 Encounters:  10/13/19 5\' 8"  (1.727 m)  10/06/19 5\' 8"  (1.727 m)  07/06/19 5\' 8"  (1.727 m)      General: The patient is awake, alert and appears not in acute distress. The patient is well groomed. Head: Normocephalic, atraumatic. Neck is supple.  Mallampati 3, functional macroglossia,  neck circumference: 16 inches . Nasal airflow congested .   Retrognathia is seen.  Dental status: dentures in upper- irregular teeth lower jaw.  Cardiovascular:  Regular rate and cardiac rhythm by pulse,  without distended neck veins. Respiratory: Lungs are clear to auscultation.  Skin:  Without evidence of ankle edema, or rash. Trunk: The patient's posture is erect.- she is seated on her walker.    Neurologic exam : The patient is awake and alert, oriented to place and time.   Memory subjective described as intact.  Attention span & concentration ability appears normal.  Speech is fluent,  without  dysarthria, dysphonia or aphasia.  Mood and affect are appropriate.   Cranial nerves: no loss of smell or taste reported  Pupils are  unequal and briskly reactive to light.  Right eye is larger - some retinal edema- macular edema - cararact surgery pending. Funduscopic exam deferred.  Extraocular movements in vertical and horizontal planes were intact and without nystagmus. No Diplopia. Visual fields by finger perimetry are intact. Hearing was intact to soft voice and finger rubbing.   Facial sensation intact to fine touch.  Facial motor strength is symmetric and tongue and uvula move midline.  Neck ROM : rotation, tilt  and flexion extension were normal for age and shoulder shrug was symmetrical.    Motor exam:  Symmetric bulk, tone and ROM.   Normal tone throughout upper extremities without cog wheeling, symmetric grip strength . Sensory:  Fine touch, pinprick and vibration were tested  On upper extremities and intact.  Proprioception tested in the upper extremities was normal. Coordination: Rapid alternating movements in the fingers/hands were of normal speed.  The Finger-to-nose maneuver  with evidence of ataxia, dysmetria and mild  tremor.   Gait and station: deferred.  Deep tendon reflexes: in the upper and lower extremities are attenuated.  Babinski response was deferred .    Mrs. Delaney Meigs is an 84 year old very pleasant African-American right-handed female with a history of chronic kidney disease stage III related to uncontrolled type 2 diabetes uncontrolled hypertension nocturnal hypoxemia, osteoarthritis morbid obesity and recurrent bilateral deep venous thrombosis.  This has left her with leg cramping very basically due to poor circulation.  She has dyspnea on exertion and has been placed on oxygen at night for what she had dizziness with hypertension control being achieved she is now less dizzy but she does have some hypertension recurring today's blood pressure was 165/59.  In the past she had been waiting for a hip surgery for a long time because her anemia prevented the surgery she reports.  Given her risk factors which are multiple for obstructive sleep apnea there is also to add her upper airway anatomy her neck size and her body mass index at 39.5 I was looking back at her hemoglobin hematocrit it is 10.5/over 31.4 CBC is otherwise normal white blood cell count was only 3.6 glucose has been varying between 55 on the low side and 201 on the high side in the morning BUN has been 34, 42 and 40 throughout the year 2018 this indicates that she was actually dehydrated creatinine was 1.4.  She is followed by  Dr. Posey Pronto with nephrology Dr. Einar Gip cardiology.  She is insulin-dependent and she is using isosorbide mononitrate by mouth daily to prevent angina.  She is on hydralazine 100 mg 4 times a day Lasix 40 mg 2 tablets in the morning twice daily which also provide she tried it at night and she has nocturia.  She is taking iron vitamin D Lipitor, Eliquis Norvasc Normodyne Lantus insulin lisinopril Metformin vitamin B12 tramadol and Januvia.  An MRI of the abdomen showed no significant renal artery stenosis but she does have bilateral renal cysts the largest one is 18 cm.  Carotid artery duplex showed only minimal plaque in the carotid arteries echocardiogram from February 2020 showed normal left ventricular size and an EF of 60% the left atrial cavity however was severely dilated at 5.4 cm she has mild aortic valve calcification she has a so-called pseudonormal diastolic dysfunction.  She has moderate pulmonary hypertension which also can be affecting her sleep apnea and vice versa.  She has sinus bradycardia at 47 bpm.      After spending a total time of 45  minutes face to face and additional time for physical and neurologic examination, review of laboratory studies,  personal review of imaging studies, reports and results of other testing and review of referral information / records as far as provided in visit, I have established the following assessments:    1) Mrs. Archie Patten is an 84 year old very pleasant African-American right-handed female with a history of chronic kidney disease stage III related to uncontrolled type 2 diabetes uncontrolled hypertension nocturnal hypoxemia, osteoarthritis morbid obesity and recurrent bilateral deep venous thrombosis.  This has left her with leg cramping very basically due to poor circulation.  She has dyspnea on exertion and has been placed on oxygen at night for what she had dizziness with hypertension control being achieved she is now less dizzy but she does have some  hypertension recurring today's blood pressure was 165/59.  In the past she had been waiting for a hip surgery for a long time because her anemia prevented the surgery she reports.  Given her risk factors which are multiple for obstructive sleep apnea there is also to add her upper airway anatomy her neck size and her body mass index at 39.5 I     My Plan is to proceed with:  1)The patient will need attended sleep study I will not order a split-night sleep study as I think we will need a whole night to see her various concerns and given that she has a history and frequent reoccurrence of leg cramping after DVT I would like to make sure that she can get in bed #2 in bedroom 2 which is an adjustable bed so she could sit up or lay flat.  2) she needs to adhere to basic sleep hygine rules, I  have already discussed with her- for more than 12 minutes  take the TV out of the bedroom, no screen no clock face visible from bed. Set a sleep timer- go to bed before midnight and rise at the same time every morning.    3)  Rv in 2-3 month with Np or me.   I would like to thank [facility]  for allowing me to meet with and to take care of this pleasant patient.   CC: I will share my notes with PCP  Electronically signed by: Melvyn Novas, MD 10/13/2019 3:14 PM  [facility] Board certified by The [employer] of Sleep Medicine and Diplomate of the [employer] of Sleep Medicine. Board certified In Neurology through the ABPN, Fellow of the [employer] of Neurology. Medical Director of [facility].

## 2019-10-13 NOTE — Patient Instructions (Signed)
Hypersomnia Hypersomnia is a condition in which a person feels very tired during the day even though he or she gets plenty of sleep at night. A person with this condition may take naps during the day and may find it very difficult to wake up from sleep. Hypersomnia may affect a person's ability to think, concentrate, drive, or remember things. What are the causes? The cause of this condition may not be known. Possible causes include:  Certain medicines.  Sleep disorders, such as narcolepsy and sleep apnea.  Injury to the head, brain, or spinal cord.  Drug or alcohol use.  Gastroesophageal reflux disease (GERD).  Tumors.  Certain medical conditions, such as depression, diabetes, or an underactive thyroid gland (hypothyroidism). What are the signs or symptoms? The main symptoms of hypersomnia include:  Feeling very tired throughout the day, regardless of how much sleep you got the night before.  Having trouble waking up. Others may find it difficult to wake you up when you are sleeping.  Sleeping for longer and longer periods at a time.  Taking naps throughout the day. Other symptoms may include:  Feeling restless, anxious, or annoyed.  Lacking energy.  Having trouble with: ? Remembering. ? Speaking. ? Thinking.  Loss of appetite.  Seeing, hearing, tasting, smelling, or feeling things that are not real (hallucinations). How is this diagnosed? This condition may be diagnosed based on:  Your symptoms and medical history.  Your sleeping habits. Your health care provider may ask you to write down your sleeping habits in a daily sleep log, along with any symptoms you have.  A series of tests that are done while you sleep (sleep study or polysomnogram).  A test that measures how quickly you can fall asleep during the day (daytime nap study or multiple sleep latency test). How is this treated? Treatment can help you manage your condition. Treatment may  include:  Following a regular sleep routine.  Lifestyle changes, such as changing your eating habits, getting regular exercise, and avoiding alcohol or caffeinated beverages.  Taking medicines to make you more alert (stimulants) during the day.  Treating any underlying medical causes of hypersomnia. Follow these instructions at home: Sleep routine   Schedule the same bedtime and wake-up time each day.  Practice a relaxing bedtime routine. This may include reading, meditation, deep breathing, or taking a warm bath before going to sleep.  Get regular exercise each day. Avoid strenuous exercise in the evening hours.  Keep your sleep environment at a cooler temperature, darkened, and quiet.  Sleep with pillows and a mattress that are comfortable and supportive.  Schedule short 20-minute naps for when you feel sleepiest during the day.  Talk with your employer or teachers about your hypersomnia. If possible, adjust your schedule so that: ? You have a regular daytime work schedule. ? You can take a scheduled nap during the day. ? You do not have to work or be active at night.  Do not eat a heavy meal for a few hours before bedtime. Eat your meals at about the same times every day.  Avoid drinking alcohol or caffeinated beverages. Safety   Do not drive or use heavy machinery if you are sleepy. Ask your health care provider if it is safe for you to drive.  Wear a life jacket when swimming or spending time near water. General instructions  Take supplements and over-the-counter and prescription medicines only as told by your health care provider.  Keep a sleep log that will help   your doctor manage your condition. This may include information about: ? What time you go to bed each night. ? How often you wake up at night. ? How many hours you sleep at night. ? How often and for how long you nap during the day. ? Any observations from others, such as leg movements during sleep,  sleep walking, or snoring.  Keep all follow-up visits as told by your health care provider. This is important. Contact a health care provider if:  You have new symptoms.  Your symptoms get worse. Get help right away if:  You have serious thoughts about hurting yourself or someone else. If you ever feel like you may hurt yourself or others, or have thoughts about taking your own life, get help right away. You can go to your nearest emergency department or call:  Your local emergency services (911 in the [place]  A suicide crisis helpline, such as the National Suicide Prevention Lifeline at 680-381-5413. This is open 24 hours a day. Summary  Hypersomnia refers to a condition in which you feel very tired during the day even though you get plenty of sleep at night.  A person with this condition may take naps during the day and may find it very difficult to wake up from sleep.  Hypersomnia may affect a person's ability to think, concentrate, drive, or remember things.  Treatment, such as following a regular sleep routine and making some lifestyle changes, can help you manage your condition. This information is not intended to replace advice given to you by your health care provider. Make sure you discuss any questions you have with your health care provider. Document Revised: 08/14/2017 Document Reviewed: 08/14/2017 Elsevier Patient Education  2020 [employer] Quality Sleep Information, Adult Quality sleep is important for your mental and physical health. It also improves your quality of life. Quality sleep means you:  Are asleep for most of the time you are in bed.  Fall asleep within 30 minutes.  Wake up no more than once a night.  Are awake for no longer than 20 minutes if you do wake up during the night. Most adults need 7-8 hours of quality sleep each night. How can poor sleep affect me? If you do not get enough quality sleep, you may have:  Mood swings.  Daytime  sleepiness.  Confusion.  Decreased reaction time.  Sleep disorders, such as insomnia and sleep apnea.  Difficulty with: ? Solving problems. ? Coping with stress. ? Paying attention. These issues may affect your performance and productivity at work, school, and at home. Lack of sleep may also put you at higher risk for accidents, suicide, and risky behaviors. If you do not get quality sleep you may also be at higher risk for several health problems, including:  Infections.  Type 2 diabetes.  Heart disease.  High blood pressure.  Obesity.  Worsening of long-term conditions, like arthritis, kidney disease, depression, Parkinson's disease, and epilepsy. What actions can I take to get more quality sleep?      Stick to a sleep schedule. Go to sleep and wake up at about the same time each day. Do not try to sleep less on weekdays and make up for lost sleep on weekends. This does not work.  Try to get about 30 minutes of exercise on most days. Do not exercise 2-3 hours before going to bed.  Limit naps during the day to 30 minutes or less.  Do not use any products that contain nicotine  or tobacco, such as cigarettes or e-cigarettes. If you need help quitting, ask your health care provider.  Do not drink caffeinated beverages for at least 8 hours before going to bed. Coffee, tea, and some sodas contain caffeine.  Do not drink alcohol close to bedtime.  Do not eat large meals close to bedtime.  Do not take naps in the late afternoon.  Try to get at least 30 minutes of sunlight every day. Morning sunlight is best.  Make time to relax before bed. Reading, listening to music, or taking a hot bath promotes quality sleep.  Make your bedroom a place that promotes quality sleep. Keep your bedroom dark, quiet, and at a comfortable room temperature. Make sure your bed is comfortable. Take out sleep distractions like TV, a computer, smartphone, and bright lights.  If you are lying  awake in bed for longer than 20 minutes, get up and do a relaxing activity until you feel sleepy.  Work with your health care provider to treat medical conditions that may affect sleeping, such as: ? Nasal obstruction. ? Snoring. ? Sleep apnea and other sleep disorders.  Talk to your health care provider if you think any of your prescription medicines may cause you to have difficulty falling or staying asleep.  If you have sleep problems, talk with a sleep consultant. If you think you have a sleep disorder, talk with your health care provider about getting evaluated by a specialist. Where to find more information  National Sleep Foundation website: https://sleepfoundation.org  National Heart, Lung, and Blood Institute (NHLBI): https://hall.info/.pdf  Centers for Disease Control and Prevention (CDC): DetailSports.is Contact a health care provider if you:  Have trouble getting to sleep or staying asleep.  Often wake up very early in the morning and cannot get back to sleep.  Have daytime sleepiness.  Have daytime sleep attacks of suddenly falling asleep and sudden muscle weakness (narcolepsy).  Have a tingling sensation in your legs with a strong urge to move your legs (restless legs syndrome).  Stop breathing briefly during sleep (sleep apnea).  Think you have a sleep disorder or are taking a medicine that is affecting your quality of sleep. Summary  Most adults need 7-8 hours of quality sleep each night.  Getting enough quality sleep is an important part of health and well-being.  Make your bedroom a place that promotes quality sleep and avoid things that may cause you to have poor sleep, such as alcohol, caffeine, smoking, and large meals.  Talk to your health care provider if you have trouble falling asleep or staying asleep. This information is not intended to replace advice given to you by your health care provider. Make  sure you discuss any questions you have with your health care provider. Document Revised: 11/19/2017 Document Reviewed: 11/19/2017 Elsevier Patient Education  2020 [employer].

## 2019-11-01 ENCOUNTER — Ambulatory Visit (INDEPENDENT_AMBULATORY_CARE_PROVIDER_SITE_OTHER): Payer: Medicare Other | Admitting: Neurology

## 2019-11-01 DIAGNOSIS — R351 Nocturia: Secondary | ICD-10-CM

## 2019-11-01 DIAGNOSIS — E119 Type 2 diabetes mellitus without complications: Secondary | ICD-10-CM

## 2019-11-01 DIAGNOSIS — G4736 Sleep related hypoventilation in conditions classified elsewhere: Secondary | ICD-10-CM

## 2019-11-01 DIAGNOSIS — N1832 Chronic kidney disease, stage 3b: Secondary | ICD-10-CM

## 2019-11-01 DIAGNOSIS — G4724 Circadian rhythm sleep disorder, free running type: Secondary | ICD-10-CM

## 2019-11-01 DIAGNOSIS — I10 Essential (primary) hypertension: Secondary | ICD-10-CM

## 2019-11-01 DIAGNOSIS — G4733 Obstructive sleep apnea (adult) (pediatric): Secondary | ICD-10-CM

## 2019-11-01 DIAGNOSIS — Z794 Long term (current) use of insulin: Secondary | ICD-10-CM

## 2019-11-01 DIAGNOSIS — G4719 Other hypersomnia: Secondary | ICD-10-CM

## 2019-11-14 DIAGNOSIS — G4736 Sleep related hypoventilation in conditions classified elsewhere: Secondary | ICD-10-CM | POA: Insufficient documentation

## 2019-11-14 DIAGNOSIS — G4733 Obstructive sleep apnea (adult) (pediatric): Secondary | ICD-10-CM | POA: Insufficient documentation

## 2019-11-14 NOTE — Procedures (Signed)
PATIENT'S NAME:  Vickie Pham, Vickie Pham DOB:      11-14-1935      MR#:    [id number]     DATE OF RECORDING: 11/01/2019 REFERRING M.D.:  Yates Decamp, MD Study Performed:   Polysomnogram with expanded Montage, Bed 2 HISTORY:  This 84 year- old African American female patient was seen on 10/13/2019, upon referral by Delman Kitten, NP. Chief concern according to patient: " I don't sleep "    Vickie Pham is a right -handed African- American female with a medical history of Arthritis, morbid obesity grade 2 - uncontrolled hypertension,  Carotid stenosis bilateral, Diabetes mellitus - with complication (HCC),  CKD grade 2 or 3 , DOE (dyspnea on exertion), DVT (deep venous thrombosis) (HCC), Hyperlipidemia, Hypertension, and Lymphedema of lower extremity, status post hip replacement .    She reports lifelong insomnia. She also feels excessively daytime sleepy. She has been watching Tv in the night. The patient also reports that her mother was oxygen dependent at night and that she is also on oxygen at night only. Sleep relevant medical history: Nocturia- 3 times, she reports a very dry mouth- plus she is on diuretics and has to have a high fluid intake.  Family history: daughter with OSA.    Sleep habits are as follows: The patient's dinner time is variable- between 5-8 PM. The patient spends the evening watching TV, seated - goes to bed and watches more TV ( adjustable flex frame bed) until midnight -and may be finally asleep at 12-4 AM , she then continues to sleep for 3- 4 hours, wakes for bathroom breaks, and is also woken by leg cramping. She is sleeping more seated than supine. The bedroom is not cool, not quiet and not dark. Dreams are reportedly frequent and vivid.  The patient endorsed the Epworth Sleepiness Scale at 21/24 points.  The patient's weight 260 pounds with a height of 68 (inches), resulting in a BMI of 39.4 kg/m2.The patient's neck circumference measured 16 inches.  CURRENT MEDICATIONS: Norvasc,  Eliquis, Lipitor, Lasix, Neurontin, Apresoline, Normodyne, Lisinopril, Metformin, Klor-con, Januvia, Ultram, Imdur-    PROCEDURE:  This is a multichannel digital polysomnogram utilizing the Somnostar 11.2 system.  Electrodes and sensors were applied and monitored per AASM Specifications.   EEG, EOG, Chin and Limb EMG, were sampled at 200 Hz.  ECG, Snore and Nasal Pressure, Thermal Airflow, Respiratory Effort, CPAP Flow and Pressure, Oximetry was sampled at 50 Hz. Digital video and audio were recorded.      BASELINE STUDY: Lights Out was at 22:52 and Lights On at 05:00.  Total recording time (TRT) was 368.5 minutes, with a total sleep time (TST) of 124.5 minutes.   The patient's sleep latency was 65 minutes.  REM latency was 72.5 minutes.  The sleep efficiency was extremely poor at only 33.8 %.     SLEEP ARCHITECTURE: WASO (Wake after sleep onset) was 190 minutes.  There were 59 minutes in Stage N1, 40 minutes Stage N2, 17 minutes Stage N3 and 8.5 minutes in Stage REM.  The percentage of Stage N1 was 47.4%, Stage N2 was 32.1%, Stage N3 was 13.7% and Stage R (REM sleep) was 6.8%.    RESPIRATORY ANALYSIS:  There were a total of 95 respiratory events:  1 obstructive apnea, 0 central apneas and 0 mixed apneas with 94 hypopneas.     The total APNEA/HYPOPNEA INDEX (AHI) was 45.8/hour.  7 events occurred in REM sleep and 174 events in NREM. The REM AHI was  49.4 /hour, versus a non-REM AHI of 45.5. The patient spent 46.5 minutes of total sleep time in the supine position and 78 minutes in non-supine. The supine AHI was 52.9/h versus a non-supine AHI of 41.6.  OXYGEN SATURATION & C02:  The Wake baseline 02 saturation was 94%, with the lowest being 81%.  Time spent below 89% saturation equaled 60 minutes. The End Tidal CO2 during sleep rose to 52 torr. Total sleep time greater than 40 torr was 60.00 minutes.   The arousals were noted as: 45 were spontaneous, 0 were associated with PLMs, and 39 were associated  with respiratory events. The patient had a total of 0 Periodic Limb Movements.   Audio and video analysis did not show any abnormal or unusual movements, behaviors, phonations or vocalizations.  The patient took no bathroom breaks (!). EKG was in keeping with regular rhythm (NSR), some PVCs.  IMPRESSION:  1. Severe Sleep Apnea (CSA) at AHI of 45.8/h, associated with hypoventilation- and severe hypercapnia and hypoxia (total time of desaturation was 60 minutes of a sleep time of only 125 minutes, 02 nadir was 81%) during sleep. The technologist characterized the recurrent hypopneas as central events, there was no struggle for air, just decreased effort.  2. No Periodic Limb Movement Disorder (PLMD)- she does not have restless legs- she has residual pain from a DVT.  3. Dysfunctions associated with Insomnia, prolonged wakefulness after arousal from sleep.  RECOMMENDATIONS:  1. Advise full-night, attended, PAP titration study to optimize therapy. This result is more typical for sleep hypoventilation/ hypopnea. Therapy needs to assist in air exchange. Central events may arise under CPAP, so optional BiPAP titration can be provided. Also, if needed, oxygen supplementation.    I certify that I have reviewed the entire raw data recording prior to the issuance of this report in accordance with the Standards of Accreditation of the American Academy of Sleep Medicine (AASM)  Larey Seat, MD Diplomat, American Board of Psychiatry and Neurology  Diplomat, American Board of Sleep Medicine Market researcher, [facility] [employer]

## 2019-11-14 NOTE — Progress Notes (Signed)
IMPRESSION:   1. Severe Sleep Apnea (CSA) at AHI of 45.8/h, associated with  hypoventilation- and severe hypercapnia and hypoxia (total time  of desaturation was 60 minutes of a sleep time of only 125  minutes, 02 nadir was 81%) during sleep. The technologist  characterized the recurrent hypopneas as central events, there  was no struggle for air, just decreased effort.  2. No Periodic Limb Movement Disorder (PLMD)- she does not have  restless legs- she has residual pain from a DVT.  3. Dysfunctions associated with Insomnia, prolonged wakefulness  after arousal from sleep.   RECOMMENDATIONS:   1. Advise full-night, attended, PAP titration study to optimize  therapy. This result is more typical for sleep hypoventilation/  hypopnea. Therapy needs to assist in air exchange. Central events  may arise under CPAP, so optional BiPAP titration can be  provided. Also, if needed, oxygen supplementation.

## 2019-11-14 NOTE — Addendum Note (Signed)
Addended by: Melvyn Novas on: 11/14/2019 02:16 PM   Modules accepted: Orders

## 2019-11-17 ENCOUNTER — Telehealth: Payer: Self-pay

## 2019-11-17 NOTE — Telephone Encounter (Signed)
Left message to call me back to schedule CPAP study and covid test.

## 2019-11-25 ENCOUNTER — Encounter: Payer: Self-pay | Admitting: Neurology

## 2019-12-08 ENCOUNTER — Telehealth: Payer: Self-pay

## 2019-12-08 NOTE — Telephone Encounter (Signed)
We have attempted to call the patient two times to schedule sleep study.  Patient has been unavailable at the phone numbers we have on file and has not returned our calls. If patient calls back we will schedule them for their sleep study.  

## 2019-12-09 ENCOUNTER — Encounter: Payer: Self-pay | Admitting: Neurology

## 2019-12-16 NOTE — Telephone Encounter (Signed)
-----   Message from Judi Cong, RN sent at 11/15/2019 11:49 AM EDT ----- To come in for titration study.  ----- Message ----- From: Melvyn Novas, MD Sent: 11/14/2019   2:16 PM EDT To: Yates Decamp, MD, Judi Cong, RN, #  IMPRESSION:   1. Severe Sleep Apnea (CSA) at AHI of 45.8/h, associated with  hypoventilation- and severe hypercapnia and hypoxia (total time  of desaturation was 60 minutes of a sleep time of only 125  minutes, 02 nadir was 81%) during sleep. The technologist  characterized the recurrent hypopneas as central events, there  was no struggle for air, just decreased effort.  2. No Periodic Limb Movement Disorder (PLMD)- she does not have  restless legs- she has residual pain from a DVT.  3. Dysfunctions associated with Insomnia, prolonged wakefulness  after arousal from sleep.   RECOMMENDATIONS:   1. Advise full-night, attended, PAP titration study to optimize  therapy. This result is more typical for sleep hypoventilation/  hypopnea. Therapy needs to assist in air exchange. Central events  may arise under CPAP, so optional BiPAP titration can be  provided. Also, if needed, oxygen supplementation.

## 2019-12-27 DIAGNOSIS — H2513 Age-related nuclear cataract, bilateral: Secondary | ICD-10-CM | POA: Insufficient documentation

## 2019-12-27 DIAGNOSIS — E113411 Type 2 diabetes mellitus with severe nonproliferative diabetic retinopathy with macular edema, right eye: Secondary | ICD-10-CM | POA: Insufficient documentation

## 2019-12-27 DIAGNOSIS — H35043 Retinal micro-aneurysms, unspecified, bilateral: Secondary | ICD-10-CM | POA: Insufficient documentation

## 2019-12-27 DIAGNOSIS — E113492 Type 2 diabetes mellitus with severe nonproliferative diabetic retinopathy without macular edema, left eye: Secondary | ICD-10-CM | POA: Insufficient documentation

## 2019-12-28 ENCOUNTER — Encounter (INDEPENDENT_AMBULATORY_CARE_PROVIDER_SITE_OTHER): Payer: Medicare Other | Admitting: Ophthalmology

## 2019-12-28 DIAGNOSIS — H35043 Retinal micro-aneurysms, unspecified, bilateral: Secondary | ICD-10-CM

## 2019-12-28 DIAGNOSIS — H2513 Age-related nuclear cataract, bilateral: Secondary | ICD-10-CM

## 2019-12-28 DIAGNOSIS — E113411 Type 2 diabetes mellitus with severe nonproliferative diabetic retinopathy with macular edema, right eye: Secondary | ICD-10-CM

## 2020-01-03 ENCOUNTER — Ambulatory Visit: Payer: Medicare Other | Admitting: Podiatry

## 2020-04-04 ENCOUNTER — Ambulatory Visit: Payer: Medicare Other | Admitting: Cardiology
